# Patient Record
Sex: Female | Born: 1990 | Race: Black or African American | Hispanic: No | Smoking: Former smoker
Health system: Southern US, Community
[De-identification: ages and names within clinical notes are randomized; demographics above are authoritative.]

## PROBLEM LIST (undated history)

## (undated) ENCOUNTER — Inpatient Hospital Stay (HOSPITAL_COMMUNITY): Payer: Self-pay

## (undated) DIAGNOSIS — L910 Hypertrophic scar: Secondary | ICD-10-CM

## (undated) DIAGNOSIS — O24419 Gestational diabetes mellitus in pregnancy, unspecified control: Secondary | ICD-10-CM

## (undated) DIAGNOSIS — M1990 Unspecified osteoarthritis, unspecified site: Secondary | ICD-10-CM

## (undated) DIAGNOSIS — Z3490 Encounter for supervision of normal pregnancy, unspecified, unspecified trimester: Secondary | ICD-10-CM

## (undated) DIAGNOSIS — Z8619 Personal history of other infectious and parasitic diseases: Secondary | ICD-10-CM

## (undated) DIAGNOSIS — T883XXA Malignant hyperthermia due to anesthesia, initial encounter: Secondary | ICD-10-CM

## (undated) DIAGNOSIS — Z202 Contact with and (suspected) exposure to infections with a predominantly sexual mode of transmission: Secondary | ICD-10-CM

## (undated) HISTORY — DX: Contact with and (suspected) exposure to infections with a predominantly sexual mode of transmission: Z20.2

## (undated) HISTORY — DX: Personal history of other infectious and parasitic diseases: Z86.19

---
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
## 2010-01-01 ENCOUNTER — Emergency Department (HOSPITAL_COMMUNITY)
Admission: EM | Admit: 2010-01-01 | Discharge: 2010-01-01 | Payer: Self-pay | Source: Home / Self Care | Admitting: Emergency Medicine

## 2010-03-01 HISTORY — PX: EXTERNAL EAR SURGERY: SHX627

## 2010-05-16 LAB — WET PREP, GENITAL: Trich, Wet Prep: NONE SEEN

## 2010-05-16 LAB — URINALYSIS, ROUTINE W REFLEX MICROSCOPIC
Bilirubin Urine: NEGATIVE
Protein, ur: NEGATIVE mg/dL
Specific Gravity, Urine: 1.025 (ref 1.005–1.030)
Urobilinogen, UA: 0.2 mg/dL (ref 0.0–1.0)

## 2010-05-16 LAB — GC/CHLAMYDIA PROBE AMP, GENITAL
Chlamydia, DNA Probe: NEGATIVE
GC Probe Amp, Genital: NEGATIVE

## 2011-03-04 ENCOUNTER — Other Ambulatory Visit (HOSPITAL_COMMUNITY)
Admission: RE | Admit: 2011-03-04 | Discharge: 2011-03-04 | Disposition: A | Discharge status: Home / Self Care | Payer: 59 | Source: Ambulatory Visit | Attending: Obstetrics and Gynecology | Admitting: Obstetrics and Gynecology

## 2011-03-04 DIAGNOSIS — Z113 Encounter for screening for infections with a predominantly sexual mode of transmission: Secondary | ICD-10-CM | POA: Insufficient documentation

## 2011-03-04 DIAGNOSIS — Z01419 Encounter for gynecological examination (general) (routine) without abnormal findings: Secondary | ICD-10-CM | POA: Insufficient documentation

## 2011-05-30 ENCOUNTER — Emergency Department (HOSPITAL_COMMUNITY)
Admission: EM | Admit: 2011-05-30 | Discharge: 2011-05-30 | Disposition: A | Discharge status: Home / Self Care | Payer: 59 | Attending: Emergency Medicine | Admitting: Emergency Medicine

## 2011-05-30 ENCOUNTER — Encounter (HOSPITAL_COMMUNITY): Payer: Self-pay | Admitting: *Deleted

## 2011-05-30 DIAGNOSIS — S61409A Unspecified open wound of unspecified hand, initial encounter: Secondary | ICD-10-CM | POA: Insufficient documentation

## 2011-05-30 DIAGNOSIS — F17200 Nicotine dependence, unspecified, uncomplicated: Secondary | ICD-10-CM | POA: Insufficient documentation

## 2011-05-30 DIAGNOSIS — S61219A Laceration without foreign body of unspecified finger without damage to nail, initial encounter: Secondary | ICD-10-CM

## 2011-05-30 MED ORDER — TETANUS-DIPHTH-ACELL PERTUSSIS 5-2.5-18.5 LF-MCG/0.5 IM SUSP
INTRAMUSCULAR | Status: AC
  Filled 2011-05-30: qty 0.5

## 2011-05-30 MED ORDER — IBUPROFEN 400 MG PO TABS: 400.0000 mg | ORAL_TABLET | Freq: Four times a day (QID) | ORAL | Status: AC | PRN

## 2011-05-30 MED ORDER — TETANUS-DIPHTH-ACELL PERTUSSIS 5-2.5-18.5 LF-MCG/0.5 IM SUSP
0.5000 mL | Freq: Once | INTRAMUSCULAR | Status: AC
  Administered 2011-05-30: 0.5 mL via INTRAMUSCULAR
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
## 2011-05-30 NOTE — ED Provider Notes (Signed)
Medical screening examination/treatment/procedure(s) were performed by non-physician practitioner and as supervising physician I was immediately available for consultation/collaboration.  Cheri Guppy, MD 05/30/11 [phone]

## 2011-05-30 NOTE — Discharge Instructions (Signed)
Stitches, Staples, or Skin Adhesive Strips   Stitches (sutures), staples, and skin adhesive strips hold the skin together as it heals. They will usually be in place for 7 days or less.  HOME CARE   Wash your hands with soap and water before and after you touch your wound.   Only take medicine as told by your doctor.   Cover your wound only if your doctor told you to. Otherwise, leave it open to air.   Do not get your stitches wet or dirty. If they get dirty, dab them gently with a clean washcloth. Wet the washcloth with soapy water. Do not rub. Pat them dry gently.   Do not put medicine or medicated cream on your stitches unless your doctor told you to.   Do not take out your own stitches or staples. Skin adhesive strips will fall off by themselves.   Do not pick at the wound. Picking can cause an infection.   Do not miss your follow-up appointment.   If you have problems or questions, call your doctor.  GET HELP RIGHT AWAY IF:    You have a temperature by mouth above 102 F (38.9 C), not controlled by medicine.   You have chills.   You have redness or pain around your stitches.   There is puffiness (swelling) around your stitches.   You notice fluid (drainage) from your stitches.   There is a bad smell coming from your wound.  MAKE SURE YOU:   Understand these instructions.   Will watch your condition.   Will get help if you are not doing well or get worse.  Document Released: 12/13/2008 Document Revised: 02/04/2011 Document Reviewed: 12/13/2008  [employer] Patient Information 2012 [employer].
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
## 2011-07-04 ENCOUNTER — Emergency Department (HOSPITAL_COMMUNITY)
Admission: EM | Admit: 2011-07-04 | Discharge: 2011-07-04 | Disposition: A | Discharge status: Home / Self Care | Payer: 59 | Attending: Emergency Medicine | Admitting: Emergency Medicine

## 2011-07-04 ENCOUNTER — Encounter (HOSPITAL_COMMUNITY): Payer: Self-pay | Admitting: Emergency Medicine

## 2011-07-04 DIAGNOSIS — Z3490 Encounter for supervision of normal pregnancy, unspecified, unspecified trimester: Secondary | ICD-10-CM

## 2011-07-04 DIAGNOSIS — O210 Mild hyperemesis gravidarum: Secondary | ICD-10-CM | POA: Insufficient documentation

## 2011-07-04 LAB — URINE MICROSCOPIC-ADD ON

## 2011-07-04 LAB — URINALYSIS, ROUTINE W REFLEX MICROSCOPIC
Bilirubin Urine: NEGATIVE
Ketones, ur: NEGATIVE mg/dL
Nitrite: NEGATIVE
Specific Gravity, Urine: 1.024 (ref 1.005–1.030)
Urobilinogen, UA: 0.2 mg/dL (ref 0.0–1.0)

## 2011-07-04 LAB — POCT PREGNANCY, URINE: Preg Test, Ur: POSITIVE — AB

## 2011-07-04 NOTE — ED Notes (Signed)
Pt sts nausea and vomiting x 3 days; pt sts LMP was 03/27 and sts took home pregnancy test was positive; pt here to be checked; pt sts white vaginal discharge

## 2011-07-04 NOTE — Discharge Instructions (Signed)
Please followup with your OB GYN in the next few weeks for a prenatal appointment.  Pregnancy If you are planning on getting pregnant, it is a good idea to make a preconception appointment with your care- giver to discuss having a healthy lifestyle before getting pregnant. Such as, diet, weight, exercise, taking prenatal vitamins especially folic acid (it helps prevent brain and spinal cord defects), avoiding alcohol, smoking and illegal drugs, medical problems (diabetes, convulsions), family history of genetic problems, working conditions and immunizations. It is better to have knowledge of these things and do something about them before getting pregnant. In your pregnancy, it is important to follow certain guidelines to have a healthy baby. It is very important to get good prenatal care and follow your caregiver's instructions. Prenatal care includes all the medical care you receive before your baby's birth. This helps to prevent problems during the pregnancy and childbirth. HOME CARE INSTRUCTIONS   Start your prenatal visits by the 12th week of pregnancy or before when possible. They are usually scheduled monthly at first. They are more often in the last 2 months before delivery. It is important that you keep your caregiver's appointments and follow your caregiver's instructions regarding medication use, exercise, and diet.   During pregnancy, you are providing food for you and your baby. Eat a regular, well-balanced diet. Choose foods such as meat, fish, milk and other dairy products, vegetables, fruits, whole-grain breads and cereals. Your caregiver will inform you of the ideal weight gain depending on your current height and weight. Drink lots of liquids. Try to drink 8 glasses of water a day.   Alcohol is associated with a number of birth defects including fetal alcohol syndrome. It is best to avoid alcohol completely. Smoking will cause low birth rate and prematurity. Use of alcohol and nicotine  during your pregnancy also increases the chances that your child will be chemically dependent later in their life and may contribute to SIDS (Sudden Infant Death Syndrome).   Do not use illegal drugs.   Only take prescription or over-the-counter medications that are recommended by your caregiver. Other medications can cause genetic and physical problems in the baby.   Morning sickness can often be helped by keeping soda crackers at the bedside. Eat a couple before arising in the morning.   A sexual relationship may be continued until near the end of pregnancy if there are no other problems such as early (premature) leaking of amniotic fluid from the membranes, vaginal bleeding, painful intercourse or belly (abdominal) pain.   Exercise regularly. Check with your caregiver if you are unsure of the safety of some of your exercises.   Do not use hot tubs, steam rooms or saunas. These increase the risk of fainting or passing out and hurting yourself and the baby. Swimming is OK for exercise. Get plenty of rest, including afternoon naps when possible especially in the third trimester.   Avoid toxic odors and chemicals.   Do not wear high heels. They may cause you to lose your balance and fall.   Do not lift over 5 pounds. If you do lift anything, lift with your legs and thighs, not your back.   Avoid long trips, especially in the third trimester.   If you have to travel out of the city or state, take a copy of your medical records with you.  SEEK IMMEDIATE MEDICAL CARE IF:   You develop an unexplained oral temperature above 102 F (38.9 C), or as your caregiver suggests.  You have leaking of fluid from the vagina. If leaking membranes are suspected, take your temperature and inform your caregiver of this when you call.   There is vaginal spotting or bleeding. Notify your caregiver of the amount and how many pads are used.   You continue to feel sick to your stomach (nauseous) and have no  relief from remedies suggested, or you throw up (vomit) blood or coffee ground like materials.   You develop upper abdominal pain.   You have round ligament discomfort in the lower abdominal area. This still must be evaluated by your caregiver.   You feel contractions of the uterus.   You do not feel the baby move, or there is less movement than before.   You have painful urination.   You have abnormal vaginal discharge.   You have persistent diarrhea.   You get a severe headache.   You have problems with your vision.   You develop muscle weakness.   You feel dizzy and faint.   You develop shortness of breath.   You develop chest pain.   You have back pain that travels down to your leg and feet.   You feel irregular or a very fast heartbeat.   You develop excessive weight gain in a short period of time (5 pounds in 3 to 5 days).   You are involved with a domestic violence situation.  Document Released: 02/15/2005 Document Revised: 02/04/2011 Document Reviewed: 08/09/2008 [facility] Patient Information 2012 [place], [place].

## 2011-07-04 NOTE — ED Provider Notes (Signed)
History  Scribed for Karen Christen, MD, the patient was seen in room STRE1/STRE1. This chart was scribed by Candelaria Stagers. The patient's care started at 12:35 PM    CSN: [id number]  Arrival date & time 07/04/11  1219   None     Chief Complaint  Patient presents with  . Emesis     HPI Karen Mcmahon is a 21 y.o. female who presents to the Emergency Department complaining of emesis for the last several days.  Nothing seems to make the vomiting better or worse.  She denies vaginal bleeding or pain.  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  She denies any other medical problems and denies drinking or smoking.  She did take a home pregnancy test which is positive.  Last menstrual period was March 27.  No vaginal bleeding or abdominal pain.   History reviewed. No pertinent past medical history.  History reviewed. No pertinent past surgical history.  History reviewed. No pertinent family history.  History  Substance Use Topics  . Smoking status: Current Everyday Smoker  . Smokeless tobacco: Not on file  . Alcohol Use: No    OB History    Grav Para Term Preterm Abortions TAB SAB Ect Mult Living                  Review of Systems  Gastrointestinal: Positive for nausea and vomiting. Negative for abdominal pain.  Genitourinary: Negative for vaginal bleeding.  All other systems reviewed and are negative.    Allergies  Review of patient's allergies indicates no known allergies.  Home Medications  No current outpatient prescriptions on file.  BP 116/79  Pulse 93  Temp(Src) 98.3 F (36.8 C) (Oral)  Resp 17  SpO2 100%  Physical Exam  Nursing note and vitals reviewed. Constitutional: She is oriented to person, place, and time. She appears well-developed and well-nourished. No distress.  HENT:  Head: Normocephalic and atraumatic.  Eyes: Conjunctivae and EOM are normal. Pupils are equal, round, and reactive to light. Right eye exhibits no discharge. Left eye exhibits no  discharge.  Neck: Normal range of motion. Neck supple.  Cardiovascular: Normal rate.   Pulmonary/Chest: Effort normal.  Abdominal: Soft. She exhibits no distension. There is no tenderness. There is no rebound and no guarding.  Musculoskeletal: Normal range of motion. She exhibits no edema and no tenderness.  Neurological: She is alert and oriented to person, place, and time.  Skin: Skin is warm and dry. She is not diaphoretic.  Psychiatric: She has a normal mood and affect. Her behavior is normal. Judgment and thought content normal.    ED Course  Procedures  DIAGNOSTIC STUDIES: Oxygen Saturation is 100% on room air, normal by my interpretation.    COORDINATION OF CARE:  12:38 PM Discussed course of care including starting prenatal vitamins and need to f/u with OBGYN.    Labs Reviewed  POCT PREGNANCY, URINE - Abnormal; Notable for the following:    Preg Test, Ur POSITIVE (*)    All other components within normal limits  URINALYSIS, ROUTINE W REFLEX MICROSCOPIC   No results found.   No diagnosis found.    MDM  Patient with normal pregnancy at this point in time.  She has no vaginal bleeding or abdominal pain.  She has nausea and vomiting which is likely related to the first trimester of pregnancy.  Patient states that she does have an OB/GYN and I've advised her to make an appointment in the next few  weeks to establish prenatal care.  I've also recommended she start on prenatal vitamins.  She is not currently drinking any alcohol or smoking and I've recommended that she continue with that.   I personally performed the services described in this documentation, which was scribed in my presence. The recorded information has been reviewed and considered.   Karen Christen, MD 07/04/11 1240

## 2011-07-30 LAB — OB RESULTS CONSOLE ABO/RH: RH Type: POSITIVE

## 2011-07-30 LAB — OB RESULTS CONSOLE RUBELLA ANTIBODY, IGM: Rubella: IMMUNE

## 2011-07-30 LAB — OB RESULTS CONSOLE HIV ANTIBODY (ROUTINE TESTING): HIV: NONREACTIVE

## 2011-07-30 LAB — OB RESULTS CONSOLE RPR: RPR: NONREACTIVE

## 2011-08-20 ENCOUNTER — Emergency Department (HOSPITAL_COMMUNITY)
Admission: EM | Admit: 2011-08-20 | Discharge: 2011-08-20 | Discharge status: Against Medical Advice | Payer: 59 | Attending: Emergency Medicine | Admitting: Emergency Medicine

## 2011-08-20 ENCOUNTER — Encounter (HOSPITAL_COMMUNITY): Payer: Self-pay | Admitting: *Deleted

## 2011-08-20 DIAGNOSIS — Z0389 Encounter for observation for other suspected diseases and conditions ruled out: Secondary | ICD-10-CM | POA: Insufficient documentation

## 2011-08-20 HISTORY — DX: Encounter for supervision of normal pregnancy, unspecified, unspecified trimester: Z34.90

## 2011-08-20 LAB — OB RESULTS CONSOLE GC/CHLAMYDIA: Chlamydia: NEGATIVE
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
## 2011-08-20 NOTE — ED Notes (Signed)
Pt to peds conference room to speak with [employer], mother present.

## 2011-08-20 NOTE — ED Notes (Signed)
Pt no longer in w/r, no answer, unable to find pt, not in w/r, peds w/r, b/r h/w triage or entry way.

## 2011-09-20 LAB — OB RESULTS CONSOLE GBS: GBS: POSITIVE

## 2011-12-18 ENCOUNTER — Encounter (HOSPITAL_COMMUNITY): Payer: Self-pay

## 2011-12-18 ENCOUNTER — Inpatient Hospital Stay (HOSPITAL_COMMUNITY)
Admission: AD | Admit: 2011-12-18 | Discharge: 2011-12-18 | Disposition: A | Discharge status: Home / Self Care | Payer: 59 | Source: Ambulatory Visit | Attending: Obstetrics and Gynecology | Admitting: Obstetrics and Gynecology

## 2011-12-18 DIAGNOSIS — O9989 Other specified diseases and conditions complicating pregnancy, childbirth and the puerperium: Secondary | ICD-10-CM

## 2011-12-18 DIAGNOSIS — O99891 Other specified diseases and conditions complicating pregnancy: Secondary | ICD-10-CM | POA: Insufficient documentation

## 2011-12-18 DIAGNOSIS — M940 Chondrocostal junction syndrome [Tietze]: Secondary | ICD-10-CM | POA: Insufficient documentation

## 2011-12-18 DIAGNOSIS — R079 Chest pain, unspecified: Secondary | ICD-10-CM | POA: Insufficient documentation

## 2011-12-18 MED ORDER — ACETAMINOPHEN-CODEINE 300-30 MG PO TABS: 1.0000 | ORAL_TABLET | ORAL | Status: DC | PRN

## 2011-12-18 MED ORDER — CYCLOBENZAPRINE HCL 10 MG PO TABS
10.0000 mg | ORAL_TABLET | Freq: Once | ORAL | Status: AC
  Administered 2011-12-18: 10 mg via ORAL
  Filled 2011-12-18: qty 1

## 2011-12-18 NOTE — MAU Note (Signed)
Pt states left rib pain when she coughs or sneezes x1 week. States doesn't have a cough. Denies any accidents falls or trauma to the area. States had 1 contraction today. Positive fetal movement. Denies complications with pregnancy, vaginal bleeding or vaginal discharge.

## 2011-12-18 NOTE — MAU Provider Note (Signed)
  History     CSN: [id number]  Arrival date and time: 12/18/11 2029   First Provider Initiated Contact with Patient 12/18/11 2135      Chief Complaint  Patient presents with  . Chest Pain    rib pain when coughs or sneezes   HPI  Pt is a G3P0020 at 29 wks IUP here with report of pain in ribs with coughing x past week.  Pt states rib pain started a week ago.  No injury to area.  Coughing started 3-4 days later.  No fever, body aches or chills.  No longer has the coughing.  Reports no other problems or concerns.    Past Medical History  Diagnosis Date  . Pregnant   . No pertinent past medical history     Past Surgical History  Procedure Date  . External ear surgery     Family History  Problem Relation Age of Onset  . Other Neg Hx     History  Substance Use Topics  . Smoking status: Former Smoker    Quit date: 05/20/2011  . Smokeless tobacco: Not on file  . Alcohol Use: No    Allergies: No Known Allergies  Prescriptions prior to admission  Medication Sig Dispense Refill  . Prenatal Vit-Fe Fumarate-FA (PRENATAL MULTIVITAMIN) TABS Take 1 tablet by mouth daily.      Marland Kitchen pyridOXINE (VITAMIN B-6) 100 MG tablet Take 100 mg by mouth 2 (two) times daily.        Review of Systems  Musculoskeletal:       Rib pain  All other systems reviewed and are negative.   Physical Exam   Blood pressure 120/68, pulse 93, temperature 97.6 F (36.4 C), temperature source Oral, resp. rate 18, height 5\' 7"  (1.702 m), weight 97.886 kg (215 lb 12.8 oz), last menstrual period 05/29/2011. O2 sat 100%%  Physical Exam  Constitutional: She is oriented to person, place, and time. She appears well-developed and well-nourished. No distress.  HENT:  Head: Normocephalic.  Neck: Normal range of motion. Neck supple.  Cardiovascular: Normal rate, regular rhythm and normal heart sounds.   Respiratory: Effort normal and breath sounds normal. No respiratory distress. She exhibits tenderness  (intercostal area).  Genitourinary: No bleeding around the vagina.  Neurological: She is alert and oriented to person, place, and time.  Skin: Skin is warm and dry.   FHR 130's, +accels, reactive Toco - none  MAU Course  Procedures  No results found for this or any previous visit (from the past 24 hour(s)).   Assessment and Plan  Costochondritis  Plan: DC to home RX Tylenol#3 Call office if pain worsens or doesn't improve  [facility] 12/18/2011, 9:35 PM

## 2012-02-24 ENCOUNTER — Telehealth (HOSPITAL_COMMUNITY): Payer: Self-pay | Admitting: *Deleted

## 2012-02-24 ENCOUNTER — Encounter (HOSPITAL_COMMUNITY): Payer: Self-pay | Admitting: *Deleted

## 2012-02-24 NOTE — Telephone Encounter (Signed)
Preadmission screen  

## 2012-02-29 ENCOUNTER — Encounter (HOSPITAL_COMMUNITY): Payer: Self-pay | Admitting: *Deleted

## 2012-02-29 ENCOUNTER — Inpatient Hospital Stay (HOSPITAL_COMMUNITY)
Admission: AD | Admit: 2012-02-29 | Discharge: 2012-02-29 | Disposition: A | Discharge status: Home / Self Care | Payer: 59 | Source: Ambulatory Visit | Attending: Obstetrics and Gynecology | Admitting: Obstetrics and Gynecology

## 2012-02-29 DIAGNOSIS — O479 False labor, unspecified: Secondary | ICD-10-CM | POA: Insufficient documentation

## 2012-02-29 MED ORDER — OXYCODONE-ACETAMINOPHEN 5-325 MG PO TABS
2.0000 | ORAL_TABLET | Freq: Once | ORAL | Status: AC
  Administered 2012-02-29: 2 via ORAL
  Filled 2012-02-29: qty 2

## 2012-02-29 NOTE — MAU Note (Signed)
Dr. Richardson Dopp notified of pt, orders rec'd.

## 2012-02-29 NOTE — MAU Note (Signed)
Pt G3 P0 at 40wks having contractions every all night.

## 2012-03-01 ENCOUNTER — Inpatient Hospital Stay (HOSPITAL_COMMUNITY)
Admission: AD | Admit: 2012-03-01 | Discharge: 2012-03-04 | DRG: 774 | Disposition: A | Discharge status: Home / Self Care | Payer: 59 | Source: Ambulatory Visit | Attending: Obstetrics and Gynecology | Admitting: Obstetrics and Gynecology

## 2012-03-01 ENCOUNTER — Encounter (HOSPITAL_COMMUNITY): Payer: Self-pay | Admitting: Anesthesiology

## 2012-03-01 ENCOUNTER — Encounter (HOSPITAL_COMMUNITY): Payer: Self-pay

## 2012-03-01 ENCOUNTER — Inpatient Hospital Stay (HOSPITAL_COMMUNITY): Payer: 59 | Admitting: Anesthesiology

## 2012-03-01 DIAGNOSIS — IMO0001 Reserved for inherently not codable concepts without codable children: Secondary | ICD-10-CM

## 2012-03-01 DIAGNOSIS — O99892 Other specified diseases and conditions complicating childbirth: Secondary | ICD-10-CM | POA: Diagnosis present

## 2012-03-01 DIAGNOSIS — O3660X0 Maternal care for excessive fetal growth, unspecified trimester, not applicable or unspecified: Secondary | ICD-10-CM | POA: Diagnosis present

## 2012-03-01 DIAGNOSIS — Z22330 Carrier of Group B streptococcus: Secondary | ICD-10-CM

## 2012-03-01 LAB — RPR: RPR Ser Ql: NONREACTIVE

## 2012-03-01 LAB — CBC
HCT: 40.3 % (ref 36.0–46.0)
Hemoglobin: 13.9 g/dL (ref 12.0–15.0)
RBC: 4.7 MIL/uL (ref 3.87–5.11)
WBC: 8.6 10*3/uL (ref 4.0–10.5)

## 2012-03-01 MED ORDER — PENICILLIN G POTASSIUM 5000000 UNITS IJ SOLR
5.0000 10*6.[IU] | Freq: Once | INTRAVENOUS | Status: AC
  Administered 2012-03-01: 5 10*6.[IU] via INTRAVENOUS
  Filled 2012-03-01: qty 5

## 2012-03-01 MED ORDER — PENICILLIN G POTASSIUM 5000000 UNITS IJ SOLR
2.5000 10*6.[IU] | INTRAMUSCULAR | Status: DC
  Administered 2012-03-01 – 2012-03-02 (×5): 2.5 10*6.[IU] via INTRAVENOUS
  Filled 2012-03-01 (×8): qty 2.5

## 2012-03-01 MED ORDER — PHENYLEPHRINE 40 MCG/ML (10ML) SYRINGE FOR IV PUSH (FOR BLOOD PRESSURE SUPPORT)
80.0000 ug | PREFILLED_SYRINGE | INTRAVENOUS | Status: DC | PRN
  Filled 2012-03-01: qty 5

## 2012-03-01 MED ORDER — PHENYLEPHRINE 40 MCG/ML (10ML) SYRINGE FOR IV PUSH (FOR BLOOD PRESSURE SUPPORT): 80.0000 ug | PREFILLED_SYRINGE | INTRAVENOUS | Status: DC | PRN

## 2012-03-01 MED ORDER — LACTATED RINGERS IV SOLN
500.0000 mL | INTRAVENOUS | Status: DC | PRN
  Administered 2012-03-01: 500 mL via INTRAVENOUS

## 2012-03-01 MED ORDER — OXYTOCIN 40 UNITS IN LACTATED RINGERS INFUSION - SIMPLE MED
1.0000 m[IU]/min | INTRAVENOUS | Status: DC
  Administered 2012-03-01: 2 m[IU]/min via INTRAVENOUS
  Administered 2012-03-01: 4 m[IU]/min via INTRAVENOUS

## 2012-03-01 MED ORDER — TERBUTALINE SULFATE 1 MG/ML IJ SOLN: 0.2500 mg | Freq: Once | INTRAMUSCULAR | Status: AC | PRN

## 2012-03-01 MED ORDER — OXYCODONE-ACETAMINOPHEN 5-325 MG PO TABS: 1.0000 | ORAL_TABLET | ORAL | Status: DC | PRN

## 2012-03-01 MED ORDER — OXYTOCIN BOLUS FROM INFUSION: 500.0000 mL | INTRAVENOUS | Status: DC

## 2012-03-01 MED ORDER — EPHEDRINE 5 MG/ML INJ
10.0000 mg | INTRAVENOUS | Status: DC | PRN
  Filled 2012-03-01: qty 4

## 2012-03-01 MED ORDER — DIPHENHYDRAMINE HCL 50 MG/ML IJ SOLN
12.5000 mg | INTRAMUSCULAR | Status: DC | PRN
  Administered 2012-03-02: 12.5 mg via INTRAVENOUS
  Filled 2012-03-01: qty 1

## 2012-03-01 MED ORDER — OXYTOCIN 40 UNITS IN LACTATED RINGERS INFUSION - SIMPLE MED
62.5000 mL/h | INTRAVENOUS | Status: DC
  Administered 2012-03-02: 62.5 mL/h via INTRAVENOUS
  Filled 2012-03-01 (×2): qty 1000

## 2012-03-01 MED ORDER — CITRIC ACID-SODIUM CITRATE 334-500 MG/5ML PO SOLN: 30.0000 mL | ORAL | Status: DC | PRN

## 2012-03-01 MED ORDER — BUTORPHANOL TARTRATE 2 MG/ML IJ SOLN
2.0000 mg | INTRAMUSCULAR | Status: DC | PRN
  Administered 2012-03-01: 2 mg via INTRAVENOUS
  Filled 2012-03-01: qty 2

## 2012-03-01 MED ORDER — FENTANYL 2.5 MCG/ML BUPIVACAINE 1/10 % EPIDURAL INFUSION (WH - ANES)
14.0000 mL/h | INTRAMUSCULAR | Status: DC
  Administered 2012-03-01 – 2012-03-02 (×3): 14 mL/h via EPIDURAL
  Filled 2012-03-01 (×3): qty 125

## 2012-03-01 MED ORDER — ONDANSETRON HCL 4 MG/2ML IJ SOLN
4.0000 mg | Freq: Four times a day (QID) | INTRAMUSCULAR | Status: DC | PRN
  Administered 2012-03-01: 4 mg via INTRAVENOUS
  Filled 2012-03-01: qty 2

## 2012-03-01 MED ORDER — LACTATED RINGERS IV SOLN: 500.0000 mL | Freq: Once | INTRAVENOUS | Status: DC

## 2012-03-01 MED ORDER — LIDOCAINE HCL (PF) 1 % IJ SOLN
INTRAMUSCULAR | Status: DC | PRN
  Administered 2012-03-01 (×2): 5 mL

## 2012-03-01 MED ORDER — LIDOCAINE HCL (PF) 1 % IJ SOLN
30.0000 mL | INTRAMUSCULAR | Status: DC | PRN
  Filled 2012-03-01: qty 30

## 2012-03-01 MED ORDER — EPHEDRINE 5 MG/ML INJ
10.0000 mg | INTRAVENOUS | Status: DC | PRN
  Administered 2012-03-01: 20:00:00 via INTRAVENOUS

## 2012-03-01 MED ORDER — ACETAMINOPHEN 325 MG PO TABS: 650.0000 mg | ORAL_TABLET | ORAL | Status: DC | PRN

## 2012-03-01 MED ORDER — IBUPROFEN 600 MG PO TABS
600.0000 mg | ORAL_TABLET | Freq: Four times a day (QID) | ORAL | Status: DC | PRN
  Administered 2012-03-02: 600 mg via ORAL
  Filled 2012-03-01: qty 1

## 2012-03-01 MED ORDER — LACTATED RINGERS IV SOLN
INTRAVENOUS | Status: DC
  Administered 2012-03-01 (×3): via INTRAVENOUS

## 2012-03-01 NOTE — Anesthesia Procedure Notes (Signed)
Epidural Patient location during procedure: OB Start time: 03/01/2012 7:14 PM  Staffing Anesthesiologist: Angus Seller., Harrell Gave. Performed by: anesthesiologist   Preanesthetic Checklist Completed: patient identified, site marked, surgical consent, pre-op evaluation, timeout performed, IV checked, risks and benefits discussed and monitors and equipment checked  Epidural Patient position: sitting Prep: site prepped and draped and DuraPrep Patient monitoring: continuous pulse ox and blood pressure Approach: midline Injection technique: LOR air and LOR saline  Needle:  Needle type: Tuohy  Needle gauge: 17 G Needle length: 9 cm and 9 Needle insertion depth: 7 cm Catheter type: closed end flexible Catheter size: 19 Gauge Catheter at skin depth: 14 cm Test dose: negative  Assessment Events: blood not aspirated, injection not painful, no injection resistance, negative IV test and no paresthesia  Additional Notes Patient identified.  Risk benefits discussed including failed block, incomplete pain control, headache, nerve damage, paralysis, blood pressure changes, nausea, vomiting, reactions to medication both toxic or allergic, and postpartum back pain.  Patient expressed understanding and wished to proceed.  All questions were answered.  Sterile technique used throughout procedure and epidural site dressed with sterile barrier dressing. No paresthesia or other complications noted.The patient did not experience any signs of intravascular injection such as tinnitus or metallic taste in mouth nor signs of intrathecal spread such as rapid motor block. Please see nursing notes for vital signs.

## 2012-03-01 NOTE — H&P (Signed)
Karen Mcmahon is a 22 y.o. G33 P0A2 female, EDC 02/29/12, presenting for labor.Contractions have been regular every 5 min or less for hours. Membranes ruptures at 6 am today. Fluid was clear.  Confirmation of ruptured membranes was done in MAU.  The patient reports uncomplicated prenatal care with Dr. Richardson Dopp at [facility].  Prenatal record was reviewed. Group B strep was positive.   OB History    Grav Para Term Preterm Abortions TAB SAB Ect Mult Living   3    2 1 1    0     Past Medical History  Diagnosis Date  . Pregnant   . Trichomonas contact, treated   . Hx of gonorrhea    Past Surgical History  Procedure Date  . External ear surgery    Family History: family history includes Diabetes in her mother and Hypertension in her mother.  There is no history of Other. Social History:  reports that she quit smoking about 9 months ago. She has never used smokeless tobacco. She reports that she uses illicit drugs (Marijuana). She reports that she does not drink alcohol.  ROS is noncontributory  Dilation: 1.5 Effacement (%): 80 Station: -2 Exam by:: S. Carrera, RNC Blood pressure 137/79, pulse 91, temperature 97 F (36.1 C), temperature source Oral, resp. rate 18, height 5\' 8"  (1.727 m), weight 99.338 kg (219 lb), last menstrual period 05/29/2011, SpO2 99.00%.   General  No acute distress HEENT nl Chest Clear S1 and S2 clear Abd BS present Fundus term Cx noted Ext nl  Prenatal labs: ABO, Rh: B/Positive/-- (05/31 0000) Antibody: Negative (05/31 0000) Rubella:   RPR: Nonreactive (05/31 0000)  HBsAg: Negative (05/31 0000)  HIV: Non-reactive (05/31 0000)  GBS: Positive (07/22 0000)   Assessment/Plan: 22 yo G3 P0 A2, 40 1/7 weeks, labor, SROM Positive GBS  Plan:  Expectant care Penicillin protocol for GBS.   Zacharias Ridling E 03/01/2012, 10:38 AM

## 2012-03-01 NOTE — Anesthesia Preprocedure Evaluation (Signed)
Anesthesia Evaluation  Patient identified by MRN, date of birth, ID band Patient awake    Reviewed: Allergy & Precautions, H&P , Patient's Chart, lab work & pertinent test results  Airway Mallampati: II TM Distance: >3 FB Neck ROM: full    Dental No notable dental hx.    Pulmonary neg pulmonary ROS,  breath sounds clear to auscultation  Pulmonary exam normal       Cardiovascular negative cardio ROS  Rhythm:regular Rate:Normal     Neuro/Psych negative neurological ROS  negative psych ROS   GI/Hepatic negative GI ROS, Neg liver ROS,   Endo/Other  negative endocrine ROS  Renal/GU negative Renal ROS     Musculoskeletal   Abdominal   Peds  Hematology negative hematology ROS (+)   Anesthesia Other Findings Pregnant     Trichomonas contact, treated        Hx of gonorrhea                 Reproductive/Obstetrics (+) Pregnancy                           Anesthesia Physical Anesthesia Plan  ASA: II  Anesthesia Plan: Epidural   Post-op Pain Management:    Induction:   Airway Management Planned:   Additional Equipment:   Intra-op Plan:   Post-operative Plan:   Informed Consent: I have reviewed the patients History and Physical, chart, labs and discussed the procedure including the risks, benefits and alternatives for the proposed anesthesia with the patient or authorized representative who has indicated his/her understanding and acceptance.     Plan Discussed with:   Anesthesia Plan Comments:         Anesthesia Quick Evaluation

## 2012-03-01 NOTE — MAU Note (Signed)
Pt states contractions started yesterday morning and pt came to MAU. She was sent home and states that her contraqctions have now gotten progressively stronger and closer together.

## 2012-03-01 NOTE — L&D Delivery Note (Signed)
Delivery Note At 2:11 PM a viable female was delivered via Vaginal, Spontaneous Delivery (Presentation: Left Occiput Anterior).  APGAR: 8, 9; weight 8 lb 13.6 oz (4015 g).   Placenta status: Intact, Spontaneous.  Cord: 3 vessels with the following complications: None.  Cord pH: n/a Uterine atony.  Methergine IM x 1 and Cytotec 800 mcg PR given.  Small to moderate clots removed from cervical os.  Placenta intact.    Anesthesia: Epidural  Episiotomy: None Lacerations: Vaginal;Labial Suture Repair: 2.0 chromic, repaired only vaginal laceration  Est. Blood Loss (mL): 500  Mom to postpartum.  Baby to skin to skin.  Kiyah Demartini 03/03/2012, 1:18 AM

## 2012-03-02 ENCOUNTER — Inpatient Hospital Stay (HOSPITAL_COMMUNITY): Admission: RE | Admit: 2012-03-02 | Payer: 59 | Source: Ambulatory Visit

## 2012-03-02 ENCOUNTER — Encounter (HOSPITAL_COMMUNITY): Payer: Self-pay | Admitting: *Deleted

## 2012-03-02 MED ORDER — OXYTOCIN 40 UNITS IN LACTATED RINGERS INFUSION - SIMPLE MED: 62.5000 mL/h | INTRAVENOUS | Status: DC | PRN

## 2012-03-02 MED ORDER — SENNOSIDES-DOCUSATE SODIUM 8.6-50 MG PO TABS
2.0000 | ORAL_TABLET | Freq: Every day | ORAL | Status: DC
  Administered 2012-03-02: 2 via ORAL

## 2012-03-02 MED ORDER — ZOLPIDEM TARTRATE 5 MG PO TABS: 5.0000 mg | ORAL_TABLET | Freq: Every evening | ORAL | Status: DC | PRN

## 2012-03-02 MED ORDER — WITCH HAZEL-GLYCERIN EX PADS: 1.0000 "application " | MEDICATED_PAD | CUTANEOUS | Status: DC | PRN

## 2012-03-02 MED ORDER — PRENATAL MULTIVITAMIN CH
1.0000 | ORAL_TABLET | Freq: Every day | ORAL | Status: DC
  Administered 2012-03-03 – 2012-03-04 (×2): 1 via ORAL
  Filled 2012-03-02 (×3): qty 1

## 2012-03-02 MED ORDER — METHYLERGONOVINE MALEATE 0.2 MG/ML IJ SOLN
INTRAMUSCULAR | Status: AC
  Administered 2012-03-02: 0.2 mg via INTRAMUSCULAR
  Filled 2012-03-02: qty 1

## 2012-03-02 MED ORDER — LANOLIN HYDROUS EX OINT: TOPICAL_OINTMENT | CUTANEOUS | Status: DC | PRN

## 2012-03-02 MED ORDER — SODIUM CHLORIDE 0.9 % IV SOLN
2.0000 g | Freq: Four times a day (QID) | INTRAVENOUS | Status: DC
  Administered 2012-03-02: 2 g via INTRAVENOUS
  Filled 2012-03-02 (×4): qty 2000

## 2012-03-02 MED ORDER — SIMETHICONE 80 MG PO CHEW: 80.0000 mg | CHEWABLE_TABLET | ORAL | Status: DC | PRN

## 2012-03-02 MED ORDER — DIPHENHYDRAMINE HCL 25 MG PO CAPS: 25.0000 mg | ORAL_CAPSULE | Freq: Four times a day (QID) | ORAL | Status: DC | PRN

## 2012-03-02 MED ORDER — BENZOCAINE-MENTHOL 20-0.5 % EX AERO: 1.0000 "application " | INHALATION_SPRAY | CUTANEOUS | Status: DC | PRN

## 2012-03-02 MED ORDER — ONDANSETRON HCL 4 MG PO TABS: 4.0000 mg | ORAL_TABLET | ORAL | Status: DC | PRN

## 2012-03-02 MED ORDER — METHYLERGONOVINE MALEATE 0.2 MG PO TABS: 0.2000 mg | ORAL_TABLET | ORAL | Status: DC | PRN

## 2012-03-02 MED ORDER — ONDANSETRON HCL 4 MG/2ML IJ SOLN: 4.0000 mg | INTRAMUSCULAR | Status: DC | PRN

## 2012-03-02 MED ORDER — TETANUS-DIPHTH-ACELL PERTUSSIS 5-2.5-18.5 LF-MCG/0.5 IM SUSP
0.5000 mL | Freq: Once | INTRAMUSCULAR | Status: DC
  Filled 2012-03-02: qty 0.5

## 2012-03-02 MED ORDER — OXYCODONE-ACETAMINOPHEN 5-325 MG PO TABS
1.0000 | ORAL_TABLET | ORAL | Status: DC | PRN
  Administered 2012-03-02 – 2012-03-03 (×2): 1 via ORAL
  Filled 2012-03-02 (×3): qty 1

## 2012-03-02 MED ORDER — IBUPROFEN 600 MG PO TABS
600.0000 mg | ORAL_TABLET | Freq: Four times a day (QID) | ORAL | Status: DC
  Administered 2012-03-03 – 2012-03-04 (×6): 600 mg via ORAL
  Filled 2012-03-02 (×6): qty 1

## 2012-03-02 MED ORDER — METHYLERGONOVINE MALEATE 0.2 MG/ML IJ SOLN: 0.2000 mg | INTRAMUSCULAR | Status: DC | PRN

## 2012-03-02 MED ORDER — DIBUCAINE 1 % RE OINT: 1.0000 "application " | TOPICAL_OINTMENT | RECTAL | Status: DC | PRN

## 2012-03-02 MED ORDER — MAGNESIUM HYDROXIDE 400 MG/5ML PO SUSP: 30.0000 mL | ORAL | Status: DC | PRN

## 2012-03-02 MED ORDER — MISOPROSTOL 200 MCG PO TABS
ORAL_TABLET | ORAL | Status: AC
  Administered 2012-03-02: 800 ug via RECTAL
  Filled 2012-03-02: qty 4

## 2012-03-02 NOTE — Progress Notes (Signed)
Patient ID: Karen Mcmahon, female   DOB: 1990-05-31, 22 y.o.   MRN: [id number] Assuming care.  Pt of Dr. Dawayne Patricia.  Suspects macrosomia.  Ultrasound ~2 weeks ago was 8.5 lbs. Pt comfortable with epidural.  Recent report of pelvic pressure with urge to push.  Still 7 cm per RN.  Forebag ruptured early this am with mod meconium but was previously clear. AFVSS Gen:NAD EM:  Reactive, good LTV.  Contractions q 2 min. Cervix 7 cm/100/+1, thicker on right but not edematous Adequate pelvis. EFW by [place] 8.5 lbs. A/p:  IUP at 40 2/7 weeks,  SROM now prolonged ROM.  Meconium.  Suspected Macrosomia. IUPC placed to confirm adequate contractions, keep pt on normal labor curve. D/c PCN and start Ampicillin at 9 am. Anticipate SVD.

## 2012-03-03 LAB — CBC
Platelets: 180 10*3/uL (ref 150–400)
RBC: 4.28 MIL/uL (ref 3.87–5.11)
WBC: 14.7 10*3/uL — ABNORMAL HIGH (ref 4.0–10.5)

## 2012-03-03 NOTE — Clinical Social Work Maternal (Signed)
    Clinical Social Work Department PSYCHOSOCIAL ASSESSMENT - MATERNAL/CHILD 03/03/2012  Patient:  Karen Mcmahon, Karen Mcmahon  Account Number:  [id number]  Admit Date:  03/01/2012  Marjo Bicker Name:   Vida Roller III    Clinical Social Worker:  Lulu Riding, [place]   Date/Time:  03/03/2012 11:00 AM  Date Referred:  03/03/2012   Referral source  CN     Referred reason  Substance Abuse   Other referral source:    I:  FAMILY / HOME ENVIRONMENT Child's legal guardian:  PARENT  Guardian - Name Guardian - Age Guardian - Address  Dru Laurel [address], [address], [place], [place]  [employer].  does not live with MOB   Other household support members/support persons Other support:   Parents report having a good support system    II  PSYCHOSOCIAL DATA Information Source:  Family Interview  Event organiser Employment:   Surveyor, quantity resources:  Media planner If [employer]:  [place] Other  [facility]   School / Grade:   Maternity Care Coordinator / Child Services Coordination / Early Interventions:  Cultural issues impacting care:   None identified    III  STRENGTHS Strengths  Adequate Resources  Compliance with medical plan  Home prepared for Child (including basic supplies)  Supportive family/friends   Strength comment:    IV  RISK FACTORS AND CURRENT PROBLEMS Current Problem:  None   Risk Factor & Current Problem Patient Issue Family Issue Risk Factor / Current Problem Comment   N N     V  SOCIAL WORK ASSESSMENT CSW met with parents in MOB's first floor room/117 to complete assessment for hx of Marijuana use.  Parents were pleasant and state they are doing well.  This is MOB's first baby and FOB's second.  He has a 81 year old daughter who lives with her mother in [place].  They report a good support system and having everything they need for baby at home.  MOB stated we could talk about anything with FOB present.  CSW inquired about MOB's history of  Marijuana use and she states no use since finding out she was pregnant and denies any other drug use.  CSW explained hospital drug screen policy and MOB was understanding.  She seemed to have a somewhat flat affect although she was appropriately attentive to the baby.  CSW asked if she had any hx of depression and discussed signs and symptoms of PPD.  She states she gets depressed sometimes, but does describes her symptoms as mild.  She states she has never taken medication for depression.  She was attentive to conversation regarding PPD and state she feels comfortable talking with her doctor if issues arise.  CSW has no further concerns at this time.      VI SOCIAL WORK PLAN Social Work Plan  No Further Intervention Required / No Barriers to Discharge   Type of pt/family education:   PPD signs and symptoms   If child protective services report - county:   If child protective services report - date:   Information/referral to community resources comment:   No needs identified at [facility] time.   Other social work plan:   CSW will monitor drug screen results.

## 2012-03-03 NOTE — Progress Notes (Signed)
Post Partum Day 1 s/p vaginal delivery  Subjective: up ad lib, voiding and tolerating PO  Objective: Blood pressure 111/64, pulse 76, temperature 98.4 F (36.9 C), temperature source Oral, resp. rate 20, height 5\' 8"  (1.727 m), weight 99.338 kg (219 lb), last menstrual period 05/29/2011, SpO2 98.00%, unknown if currently breastfeeding.  Physical Exam:  General: alert and cooperative Lochia: appropriate Uterine Fundus: firm Incision: NA DVT Evaluation: No evidence of DVT seen on physical exam.   Basename 03/03/12 0540 03/01/12 0805  HGB 12.5 13.9  HCT 36.7 40.3    Assessment/Plan: Plan for discharge tomorrow  routine postpartum care    LOS: 2 days   Karen Mcmahon J. 03/03/2012, 1:40 PM

## 2012-03-03 NOTE — Anesthesia Postprocedure Evaluation (Signed)
Anesthesia Post Note  Patient: Karen Mcmahon  Procedure(s) Performed: * No procedures listed *  Anesthesia type: Epidural  Patient location: Mother/Baby  Post pain: Pain level controlled  Post assessment: Post-op Vital signs reviewed  Last Vitals:  Filed Vitals:   03/03/12 0512  BP: 111/64  Pulse: 76  Temp: 36.9 C  Resp: 20    Post vital signs: Reviewed  Level of consciousness:alert  Complications: No apparent anesthesia complications

## 2012-03-04 DIAGNOSIS — IMO0001 Reserved for inherently not codable concepts without codable children: Secondary | ICD-10-CM

## 2012-03-04 MED ORDER — IBUPROFEN 600 MG PO TABS: 600.0000 mg | ORAL_TABLET | Freq: Four times a day (QID) | ORAL | Status: DC | PRN

## 2012-03-04 NOTE — Discharge Summary (Signed)
Obstetric Discharge Summary Reason for Admission: rupture of membranes Prenatal Procedures: none Intrapartum Procedures: spontaneous vaginal delivery Postpartum Procedures: none Complications-Operative and Postpartum: hemorrhage Hemoglobin  Date Value Range Status  03/03/2012 12.5  12.0 - 15.0 g/dL Final     HCT  Date Value Range Status  03/03/2012 36.7  36.0 - 46.0 % Final    Physical Exam:  General: alert and cooperative Lochia: appropriate Uterine Fundus: firm Incision: NA DVT Evaluation: No evidence of DVT seen on physical exam.  Discharge Diagnoses: Term Pregnancy-delivered  Discharge Information: Date: 03/04/2012 Activity: pelvic rest Diet: routine Medications: Tylenol #3 and Ibuprofen Condition: stable Instructions: refer to practice specific booklet Discharge to: home Follow-up Information    Follow up with Jessee Avers., MD. Schedule an appointment as soon as possible for a visit in 6 weeks. (postpartum visit )    Contact information:   [address] [place] [address] [phone]          Newborn Data: Live born female  Birth Weight: 8 lb 13.6 oz (4015 g) APGAR: 8, 9  Home with mother.  Serapio Edelson J. 03/04/2012, 8:31 AM

## 2012-03-04 NOTE — Progress Notes (Signed)
Post Partum Day 2 s/p vaginal delivery  Subjective: no complaints, up ad lib, voiding and tolerating PO  Objective: Blood pressure 110/69, pulse 76, temperature 97.4 F (36.3 C), temperature source Oral, resp. rate 18, height 5\' 8"  (1.727 m), weight 99.338 kg (219 lb), last menstrual period 05/29/2011, SpO2 98.00%, unknown if currently breastfeeding.  Physical Exam:  General: alert and cooperative Lochia: appropriate Uterine Fundus: firm Incision: NA DVT Evaluation: No evidence of DVT seen on physical exam.   Basename 03/03/12 0540  HGB 12.5  HCT 36.7    Assessment/Plan: Discharge home and Contraception discussed with patient she is undecided    LOS: 3 days   Kyli Sorter J. 03/04/2012, 8:25 AM

## 2012-03-17 ENCOUNTER — Telehealth: Payer: Self-pay | Admitting: Radiation Oncology

## 2012-03-17 NOTE — Telephone Encounter (Signed)
Patient left message to contact her at [phone]. Called back this number and man that answered phone did not know who this person is. Confirmed number is Darryl Nestle who took message. Call patient's mobile number listed in demographics. No answer. Unable to leave message because mailbox is full. Home number listed in demographics has been disconnected.

## 2012-03-21 ENCOUNTER — Encounter: Payer: Self-pay | Admitting: Radiation Oncology

## 2012-03-22 ENCOUNTER — Ambulatory Visit
Admission: RE | Admit: 2012-03-22 | Discharge: 2012-03-22 | Disposition: A | Discharge status: Home / Self Care | Payer: 59 | Source: Ambulatory Visit | Attending: Radiation Oncology | Admitting: Radiation Oncology

## 2012-03-22 ENCOUNTER — Encounter: Payer: Self-pay | Admitting: Radiation Oncology

## 2012-03-22 VITALS — BP 120/75 | HR 93 | Temp 97.9°F | Resp 20 | Wt 204.2 lb

## 2012-03-22 DIAGNOSIS — L910 Hypertrophic scar: Secondary | ICD-10-CM | POA: Diagnosis not present

## 2012-03-22 DIAGNOSIS — Z87891 Personal history of nicotine dependence: Secondary | ICD-10-CM | POA: Insufficient documentation

## 2012-03-22 DIAGNOSIS — O99893 Other specified diseases and conditions complicating puerperium: Secondary | ICD-10-CM | POA: Insufficient documentation

## 2012-03-22 HISTORY — DX: Hypertrophic scar: L91.0

## 2012-03-22 NOTE — Addendum Note (Signed)
Encounter addended by: Delynn Flavin, RN on: 03/22/2012  6:07 PM<BR>     Documentation filed: Charges VN

## 2012-03-22 NOTE — Progress Notes (Signed)
Here today for assessment of Left lateral earlobe Keloid.  This Keloid has been removed twice with recurrence.  Denies pain , but has itching of the keloid.    Has a small keloid on her right inner forearm.

## 2012-03-22 NOTE — Progress Notes (Signed)
Radiation Oncology         [phone] ________________________________  Initial outpatient Consultation  Name: Karen Mcmahon MRN: [id number]  Date: 03/22/2012  DOB: June 06, 1990   REFERRING PHYSICIAN: Fran Lowes.,*  DIAGNOSIS: The encounter diagnosis was Keloid scar of skin.  HISTORY OF PRESENT ILLNESS::Karen Mcmahon is a 22 y.o. female who is almost 22 years old. She reports that in 9th grade she self pierced the helix of her left ear. She developed a keloid which was excised several years ago by Dr. Shon Hough. She underwent steroid injections postoperatively, but the keloid recurred. She underwent repeat surgery by Dr Shon Hough, but the patient cannot report exactly when the surgery occurred. It was at least several months ago. She cannot remember if any adjuvant therapy was provided. She recalls that she did use creams over her ear in the past. She reports that she is not compliant with applying the cream as frequently as instructed. She cannot remember the type of cream that was recommended.  The patient sought a second opinion after her second recurrence of the keloid. She saw Dr. Margo Aye of dermatology. Dr. Margo Aye has tentative plans to remove it again though she has not yet scheduled. Her surgery had been delayed until she gave birth to her baby boy. She is postpartum now, having given birth to her baby boy at the beginning of this month.  The patient denies any prior history of cancer. She reports that the keloid itches. She denies dry mouth. She is a somewhat limited historian.  PREVIOUS RADIATION THERAPY: No  PAST MEDICAL HISTORY:  has a past medical history of Pregnant; Trichomonas contact, treated; gonorrhea; and Keloid.    PAST SURGICAL HISTORY: Past Surgical History  Procedure Date  . External ear surgery     FAMILY HISTORY: family history includes Diabetes in her mother and Hypertension in her mother.  There is no history of Other.  SOCIAL HISTORY:  reports that  she quit smoking about 10 months ago. She has never used smokeless tobacco. She reports that she uses illicit drugs (Marijuana). She reports that she does not drink alcohol.  ALLERGIES: Review of patient's allergies indicates no known allergies.  MEDICATIONS:  No current outpatient prescriptions on file.    REVIEW OF SYSTEMS:   Pertinent items are noted in HPI.   PHYSICAL EXAM:  weight is 204 lb 3.2 oz (92.625 kg). Her oral temperature is 97.9 F (36.6 C). Her blood pressure is 120/75 and her pulse is 93. Her respiration is 20.   Sitting comfortably in a chair. No acute distress. Left ear notable for a keloid along the helix, measuring approximately 3-1/2 cm in greatest dimension. Oral cavity demonstrates no lesions. Relatively moist mucous membranes. Significant acne with scarring along left cheek.  LABORATORY DATA:  Lab Results  Component Value Date   WBC 14.7* 03/03/2012   HGB 12.5 03/03/2012   HCT 36.7 03/03/2012   MCV 85.7 03/03/2012   PLT 180 03/03/2012   CMP  No results found for this basename: na, k, cl, co2, glucose, bun, creatinine, calcium, prot, albumin, ast, alt, alkphos, bilitot, gfrnonaa, gfraa        RADIOGRAPHY: No results found.    IMPRESSION/PLAN: This is a pleasant 22 year old woman with recurrent keloid of the left ear. It has recurred at least 2 times. She was treated adjuvantly with steroid injections at least one of the times that she underwent surgery.    I spoke with Dr. Margo Aye personally about the patient.  Our tentative plan is for her to undergo surgery on February 11 the simulation the afternoon of surgery to plan her radiotherapy. She would then receive her first fraction on February 12, second fraction on February 14 and third fraction February 17. I would treat the surgical site to 12 Gray in 3 fractions. I would use lead blocking to minimize radiation to the tissues around and behind her ear.   Since the patient has failed surgery twice, in spite of adjuvant  steroid injections, Dr. Margo Aye does not offer any other adjuvant therapies that he believes would adequately minimize her risk of recurrence.   Dr. Margo Aye will offer the patient a second opinion at [facility] in case she is interested in other adjuvant therapies other than radiotherapy. If this is the case, she will not be scheduled for surgery or radiotherapy.  I've spoken with the patient and Dr. Margo Aye about the small risk of secondary malignancies from radiotherapy. Such malignancies have been anecdotally reported in the medical literature.      I also spoke with the patient about the risk of skin pigmentation, damage to the cartilage of the ear, and fatigue from radiotherapy. There is a small risk of hair loss at the posterior scalp line.  She seems to be enthusiastic about treatment, but if she ultimately decides to pursue second opinion as offered by Dr. Margo Aye, I would not discouraged that.  I also underscored to the patient that if there is any chance she is pregnant prior to radiotherapy, she will need to undergo a pregnancy test. She understands that radiotherapy is absolutely contraindicated for pregnant women. She says that there is no chance she'll be pregnant before radiotherapy, but I told her she becomes sexually active in the interim she needs use contraception.  I spent 25 minutes minutes face to face with the patient and more than 50% of that time was spent in counseling and/or coordination of care.    __________________________________________   Lonie Peak, MD

## 2012-03-22 NOTE — Progress Notes (Signed)
Please see the Nurse Progress Note in the MD Initial Consult Encounter for this patient. 

## 2012-03-24 NOTE — Addendum Note (Signed)
Encounter addended by: Velvie Thomaston Mintz Chrisanne Loose, RN on: 03/24/2012  5:04 PM<BR>     Documentation filed: Charges VN

## 2012-04-12 ENCOUNTER — Ambulatory Visit: Payer: 59

## 2012-04-14 ENCOUNTER — Ambulatory Visit: Payer: 59

## 2012-04-17 ENCOUNTER — Ambulatory Visit: Payer: 59

## 2012-09-29 ENCOUNTER — Encounter (HOSPITAL_COMMUNITY): Payer: Self-pay | Admitting: Emergency Medicine

## 2012-09-29 ENCOUNTER — Emergency Department (HOSPITAL_COMMUNITY)
Admission: EM | Admit: 2012-09-29 | Discharge: 2012-09-29 | Disposition: A | Discharge status: Home / Self Care | Payer: 59 | Attending: Emergency Medicine | Admitting: Emergency Medicine

## 2012-09-29 DIAGNOSIS — Z3202 Encounter for pregnancy test, result negative: Secondary | ICD-10-CM | POA: Insufficient documentation

## 2012-09-29 DIAGNOSIS — Z872 Personal history of diseases of the skin and subcutaneous tissue: Secondary | ICD-10-CM | POA: Insufficient documentation

## 2012-09-29 DIAGNOSIS — N730 Acute parametritis and pelvic cellulitis: Secondary | ICD-10-CM | POA: Insufficient documentation

## 2012-09-29 DIAGNOSIS — F17200 Nicotine dependence, unspecified, uncomplicated: Secondary | ICD-10-CM | POA: Insufficient documentation

## 2012-09-29 DIAGNOSIS — Z8619 Personal history of other infectious and parasitic diseases: Secondary | ICD-10-CM | POA: Insufficient documentation

## 2012-09-29 LAB — URINALYSIS, ROUTINE W REFLEX MICROSCOPIC
Bilirubin Urine: NEGATIVE
Glucose, UA: NEGATIVE mg/dL
Hgb urine dipstick: NEGATIVE
Ketones, ur: NEGATIVE mg/dL
pH: 6 (ref 5.0–8.0)

## 2012-09-29 LAB — WET PREP, GENITAL: Yeast Wet Prep HPF POC: NONE SEEN

## 2012-09-29 LAB — URINE MICROSCOPIC-ADD ON

## 2012-09-29 MED ORDER — AZITHROMYCIN 500 MG PO TABS: 1000.0000 mg | ORAL_TABLET | Freq: Once | ORAL | Status: DC

## 2012-09-29 MED ORDER — CEFTRIAXONE SODIUM 250 MG IJ SOLR
250.0000 mg | Freq: Once | INTRAMUSCULAR | Status: AC
  Administered 2012-09-29: 250 mg via INTRAMUSCULAR
  Filled 2012-09-29: qty 250

## 2012-09-29 MED ORDER — METRONIDAZOLE 500 MG PO TABS: 500.0000 mg | ORAL_TABLET | Freq: Two times a day (BID) | ORAL | Status: AC

## 2012-09-29 MED ORDER — CEFTRIAXONE SODIUM 250 MG IJ SOLR: 250.0000 mg | Freq: Once | INTRAMUSCULAR | Status: DC

## 2012-09-29 MED ORDER — LIDOCAINE HCL (PF) 1 % IJ SOLN
INTRAMUSCULAR | Status: AC
  Filled 2012-09-29: qty 5

## 2012-09-29 MED ORDER — METRONIDAZOLE 500 MG PO TABS
2000.0000 mg | ORAL_TABLET | Freq: Once | ORAL | Status: AC
  Administered 2012-09-29: 2000 mg via ORAL
  Filled 2012-09-29: qty 4

## 2012-09-29 MED ORDER — AZITHROMYCIN 250 MG PO TABS: 1000.0000 mg | ORAL_TABLET | Freq: Once | ORAL | Status: DC

## 2012-09-29 MED ORDER — METRONIDAZOLE 500 MG PO TABS: 500.0000 mg | ORAL_TABLET | Freq: Two times a day (BID) | ORAL | Status: DC

## 2012-09-29 NOTE — ED Notes (Signed)
Pt c/o foul smelling yellow vaginal discharge x 3 weeks; pt sts LMP was in July

## 2012-09-29 NOTE — ED Provider Notes (Signed)
CSN: [id number]     Arrival date & time 09/29/12  1548 History     None    Chief Complaint  Patient presents with  . Vaginal Discharge   (Consider location/radiation/quality/duration/timing/severity/associated sxs/prior Treatment) HPI 22 y o b f with complaints of vaginal discharge for 2 weeks,milky coloured, with fishy odour, but not itchy. Assoc with abd pain- for the past 2 weeks, described as crampy, relieved by taking alieve, no aggrav factor. No fever, no dysuria or frequency . Sexually active with one partner in the past year, doesn't use condoms, LMP- 5th July, regular, 28-30 day cycle, had a previous hx of chlamydia and trichomonas infection 2 years ago,reports compliance with antibiotics, has an IUD presently.    Past Medical History  Diagnosis Date  . Pregnant   . Trichomonas contact, treated   . Hx of gonorrhea   . Keloid     Left Upper Lateral Earlobe   Past Surgical History  Procedure Laterality Date  . External ear surgery     Family History  Problem Relation Age of Onset  . Other Neg Hx   . Diabetes Mother   . Hypertension Mother    History  Substance Use Topics  . Smoking status: Current Every Day Smoker    Last Attempt to Quit: 05/20/2011  . Smokeless tobacco: Never Used  . Alcohol Use: Yes     Comment: occ   OB History   Grav Para Term Preterm Abortions TAB SAB Ect Mult Living   3 1 1  2 1 1   1      Review of Systems No pertinent findings in ROS.  Allergies  Review of patient's allergies indicates no known allergies.  Home Medications   Current Outpatient Rx  Name  Route  Sig  Dispense  Refill  . azithromycin (ZITHROMAX) 500 MG tablet   Oral   Take 2 tablets (1,000 mg total) by mouth once.   2 tablet   0   . cefTRIAXone (ROCEPHIN) 250 MG injection   Intramuscular   Inject 250 mg into the muscle once. FOR IM use in LARGE MUSCLE MASS   1 each   0   . metroNIDAZOLE (FLAGYL) 500 MG tablet   Oral   Take 1 tablet (500 mg total) by mouth  every 12 (twelve) hours.   14 tablet   0    BP 119/77  Pulse 85  Temp(Src) 98 F (36.7 C) (Oral)  Resp 16  SpO2 100% Physical Exam Gen- alert, oriented, not in any distress. Cardiac- RRR, no murmurs, rubs or gallops. Resp- clear to auscultation. Abd- Soft, non tender, normal bowel sounds, no masses or organomegaly. Pelvic- sterile speculum examination- cervical os central, with moderate milky coloured discharge. Bimanual pelvic exam- no adnexa masses, no cervical motion tenderness.  ED Course   Procedures (including critical care time)   Labs Reviewed  WET PREP, GENITAL - Abnormal; Notable for the following:    Clue Cells Wet Prep HPF POC MANY (*)    WBC, Wet Prep HPF POC MANY (*)    All other components within normal limits  URINALYSIS, ROUTINE W REFLEX MICROSCOPIC - Abnormal; Notable for the following:    Leukocytes, UA TRACE (*)    All other components within normal limits  GC/CHLAMYDIA PROBE AMP  URINE MICROSCOPIC-ADD ON  RPR  POCT PREGNANCY, URINE  POCT PREGNANCY, URINE   No results found. No diagnosis found.  MDM   Pelvic Inflammatory Dx- - Vaginal discharge- PCR for Gonoccocal  and Chlamydia probe amp. - RPR - ceftriaxone  - Azithromycin -Preg test- negative.  Bacterial vaginosis and trichomonas- Wet prep was done- neg for yeast and trichomonas, but positive for many clue cells. - Metronidazole- patient had a baby in January this year. Though she said she is not breast feeding, she was advised to avoid breast feeding for at least 24 hrs after her dose of metronidazole. -Counselled patient about recurrence of infection and treatment of partner, and the risk of getting even more serious sexually transmitted infection, with unprotected intercourse.  Kennis Carina, MD 09/29/12 1610

## 2012-09-30 NOTE — ED Provider Notes (Signed)
I saw and evaluated the patient, reviewed the resident's note and I agree with the findings and plan.   .Face to face Exam:  General:  Awake HEENT:  Atraumatic Resp:  Normal effort Abd:  Nondistended Neuro:No focal weakness  Nelia Shi, MD 09/30/12 1116

## 2012-11-13 ENCOUNTER — Other Ambulatory Visit: Payer: Self-pay | Admitting: Obstetrics and Gynecology

## 2012-11-13 ENCOUNTER — Other Ambulatory Visit (HOSPITAL_COMMUNITY)
Admission: RE | Admit: 2012-11-13 | Discharge: 2012-11-13 | Disposition: A | Discharge status: Home / Self Care | Payer: 59 | Source: Ambulatory Visit | Attending: Obstetrics and Gynecology | Admitting: Obstetrics and Gynecology

## 2012-11-13 DIAGNOSIS — Z113 Encounter for screening for infections with a predominantly sexual mode of transmission: Secondary | ICD-10-CM | POA: Insufficient documentation

## 2012-11-13 DIAGNOSIS — Z01419 Encounter for gynecological examination (general) (routine) without abnormal findings: Secondary | ICD-10-CM | POA: Insufficient documentation

## 2013-01-25 ENCOUNTER — Emergency Department (HOSPITAL_COMMUNITY)
Admission: EM | Admit: 2013-01-25 | Discharge: 2013-01-25 | Disposition: A | Discharge status: Home / Self Care | Payer: 59 | Attending: Emergency Medicine | Admitting: Emergency Medicine

## 2013-01-25 ENCOUNTER — Encounter (HOSPITAL_COMMUNITY): Payer: Self-pay | Admitting: Emergency Medicine

## 2013-01-25 DIAGNOSIS — N760 Acute vaginitis: Secondary | ICD-10-CM | POA: Insufficient documentation

## 2013-01-25 DIAGNOSIS — Z8619 Personal history of other infectious and parasitic diseases: Secondary | ICD-10-CM | POA: Insufficient documentation

## 2013-01-25 DIAGNOSIS — B9689 Other specified bacterial agents as the cause of diseases classified elsewhere: Secondary | ICD-10-CM

## 2013-01-25 DIAGNOSIS — F17200 Nicotine dependence, unspecified, uncomplicated: Secondary | ICD-10-CM | POA: Insufficient documentation

## 2013-01-25 DIAGNOSIS — N949 Unspecified condition associated with female genital organs and menstrual cycle: Secondary | ICD-10-CM | POA: Insufficient documentation

## 2013-01-25 DIAGNOSIS — Z872 Personal history of diseases of the skin and subcutaneous tissue: Secondary | ICD-10-CM | POA: Insufficient documentation

## 2013-01-25 LAB — WET PREP, GENITAL: Yeast Wet Prep HPF POC: NONE SEEN

## 2013-01-25 MED ORDER — METRONIDAZOLE 500 MG PO TABS
2000.0000 mg | ORAL_TABLET | Freq: Once | ORAL | Status: AC
  Administered 2013-01-25: 2000 mg via ORAL
  Filled 2013-01-25: qty 4

## 2013-01-25 MED ORDER — ONDANSETRON 4 MG PO TBDP
4.0000 mg | ORAL_TABLET | Freq: Once | ORAL | Status: AC
  Administered 2013-01-25: 4 mg via ORAL
  Filled 2013-01-25: qty 1

## 2013-01-25 NOTE — ED Provider Notes (Signed)
Medical screening examination/treatment/procedure(s) were performed by non-physician practitioner and as supervising physician I was immediately available for consultation/collaboration.  EKG Interpretation   None        Dickson Kostelnik R. Addalynne Golding, MD 01/25/13 1557 

## 2013-01-25 NOTE — ED Provider Notes (Signed)
CSN: [id number]     Arrival date & time 01/25/13  1509 History   First MD Initiated Contact with Patient 01/25/13 1515     Chief Complaint  Patient presents with  . Vaginal Discharge   (Consider location/radiation/quality/duration/timing/severity/associated sxs/prior Treatment) Patient is a 22 y.o. female presenting with vaginal discharge.  Vaginal Discharge   Karen Mcmahon is a 22 y.o.female with a significant PMH of tichomonas, gonorrhea, yeast and bacterial infections of the vagina  presents to the ER with complaints of vaginal discharge for 1 week. She was last sexually active + 3 months ago and has had 3 normal periods since then. The discharge is green/whitish and she is having some mild pelvic pain with it. Denies nausea, vomiting, diarrhea, fevers, abdominal swelling or weight gain.   Past Medical History  Diagnosis Date  . Pregnant   . Trichomonas contact, treated   . Hx of gonorrhea   . Keloid     Left Upper Lateral Earlobe   Past Surgical History  Procedure Laterality Date  . External ear surgery     Family History  Problem Relation Age of Onset  . Other Neg Hx   . Diabetes Mother   . Hypertension Mother    History  Substance Use Topics  . Smoking status: Current Every Day Smoker    Last Attempt to Quit: 05/20/2011  . Smokeless tobacco: Never Used  . Alcohol Use: Yes     Comment: occ   OB History   Grav Para Term Preterm Abortions TAB SAB Ect Mult Living   3 1 1  2 1 1   1      Review of Systems  Genitourinary: Positive for vaginal discharge.    Review of Systems  Gen: no weight loss, fevers, chills, night sweats  Eyes: no discharge or drainage, no occular pain or visual changes  Nose: no epistaxis or rhinorrhea  Mouth: no dental pain, no sore throat  Neck: no neck pain  Lungs:No wheezing, coughing or hemoptysis CV: no chest pain, palpitations, dependent edema or orthopnea  Abd: no abdominal pain, nausea, vomiting  GU: no dysuria or gross  hematuria  , + vaginal discharge MSK:  No abnormalities  Neuro: no headache, no focal neurologic deficits  Skin: no abnormalities Psyche: negative.   Allergies  Review of patient's allergies indicates no known allergies.  Home Medications   Current Outpatient Rx  Name  Route  Sig  Dispense  Refill  . azithromycin (ZITHROMAX) 500 MG tablet   Oral   Take 2 tablets (1,000 mg total) by mouth once.   2 tablet   0   . cefTRIAXone (ROCEPHIN) 250 MG injection   Intramuscular   Inject 250 mg into the muscle once. FOR IM use in LARGE MUSCLE MASS   1 each   0    BP 113/63  Pulse 84  Temp(Src) 97.7 F (36.5 C) (Oral)  Resp 20  Ht 5\' 8"  (1.727 m)  Wt 178 lb 8 oz (80.967 kg)  BMI 27.15 kg/m2  SpO2 100%  LMP 12/30/2012 Physical Exam  Nursing note and vitals reviewed. Constitutional: She appears well-developed and well-nourished. No distress.  HENT:  Head: Normocephalic and atraumatic.  Eyes: Pupils are equal, round, and reactive to light.  Neck: Normal range of motion. Neck supple.  Cardiovascular: Normal rate and regular rhythm.   Pulmonary/Chest: Effort normal.  Abdominal: Soft.  Genitourinary: Uterus normal. Cervix exhibits no motion tenderness. Right adnexum displays no mass, no tenderness and no fullness.  Left adnexum displays no mass, no tenderness and no fullness. No bleeding around the vagina. No foreign body around the vagina. Vaginal discharge found.  Neurological: She is alert.  Skin: Skin is warm and dry.    ED Course  Procedures (including critical care time) Labs Review Labs Reviewed  WET PREP, GENITAL - Abnormal; Notable for the following:    Clue Cells Wet Prep HPF POC MANY (*)    WBC, Wet Prep HPF POC MANY (*)    All other components within normal limits  GC/CHLAMYDIA PROBE AMP   Imaging Review No results found.  EKG Interpretation   None       MDM   1. Bacterial vaginosis     Patients wet prep shows many WBC and clue cells. Patient  chooses to wait for phone call before treated for Gonorrhea or chlamydia. Will have flow manager call her if positive.  Otherwise patient given 2 mg PO flagyl.   Advised to not be sexually active for 1 week, unless otherwise specified by GC cultures.  22 y.o.Karen Mcmahon's evaluation in the Emergency Department is complete. It has been determined that no acute conditions requiring further emergency intervention are present at this time. The patient/guardian have been advised of the diagnosis and plan. We have discussed signs and symptoms that warrant return to the ED, such as changes or worsening in symptoms.  Vital signs are stable at discharge. Filed Vitals:   01/25/13 1526  BP: 113/63  Pulse: 84  Temp:   Resp: 20    Patient/guardian has voiced understanding and agreed to follow-up with the PCP or specialist.      Dorthula Matas, PA-C 01/25/13 1551

## 2013-01-25 NOTE — ED Notes (Signed)
Pt complaining of discharge from the vagina x 1 week. Pt states the discharge is yellow. Pt denies sexual activity for the last 3 months. Pt states she has a hx of yeast and bacterial infections. LMP was early November.

## 2013-01-27 LAB — GC/CHLAMYDIA PROBE AMP: CT Probe RNA: NEGATIVE

## 2013-02-13 ENCOUNTER — Emergency Department (HOSPITAL_COMMUNITY)
Admission: EM | Admit: 2013-02-13 | Discharge: 2013-02-13 | Discharge status: Against Medical Advice | Payer: 59 | Attending: Emergency Medicine | Admitting: Emergency Medicine

## 2013-02-13 ENCOUNTER — Encounter (HOSPITAL_COMMUNITY): Payer: Self-pay | Admitting: Emergency Medicine

## 2013-02-13 DIAGNOSIS — Z8619 Personal history of other infectious and parasitic diseases: Secondary | ICD-10-CM | POA: Insufficient documentation

## 2013-02-13 DIAGNOSIS — N898 Other specified noninflammatory disorders of vagina: Secondary | ICD-10-CM | POA: Insufficient documentation

## 2013-02-13 DIAGNOSIS — Z202 Contact with and (suspected) exposure to infections with a predominantly sexual mode of transmission: Secondary | ICD-10-CM | POA: Insufficient documentation

## 2013-02-13 DIAGNOSIS — L910 Hypertrophic scar: Secondary | ICD-10-CM | POA: Insufficient documentation

## 2013-02-13 DIAGNOSIS — Z2089 Contact with and (suspected) exposure to other communicable diseases: Secondary | ICD-10-CM | POA: Insufficient documentation

## 2013-02-13 LAB — URINALYSIS, ROUTINE W REFLEX MICROSCOPIC
Bilirubin Urine: NEGATIVE
Hgb urine dipstick: NEGATIVE
Protein, ur: NEGATIVE mg/dL
Urobilinogen, UA: 0.2 mg/dL (ref 0.0–1.0)

## 2013-02-13 NOTE — ED Notes (Signed)
Pt seen here recently and treated for BV; pt sts continued vaginal discharge and odor; pt sts LMP was in December

## 2013-02-22 ENCOUNTER — Emergency Department (HOSPITAL_COMMUNITY)
Admission: EM | Admit: 2013-02-22 | Discharge: 2013-02-22 | Disposition: A | Discharge status: Home / Self Care | Payer: 59 | Attending: Emergency Medicine | Admitting: Emergency Medicine

## 2013-02-22 ENCOUNTER — Encounter (HOSPITAL_COMMUNITY): Payer: Self-pay | Admitting: Emergency Medicine

## 2013-02-22 DIAGNOSIS — F17200 Nicotine dependence, unspecified, uncomplicated: Secondary | ICD-10-CM | POA: Insufficient documentation

## 2013-02-22 DIAGNOSIS — Z872 Personal history of diseases of the skin and subcutaneous tissue: Secondary | ICD-10-CM | POA: Insufficient documentation

## 2013-02-22 DIAGNOSIS — Z8619 Personal history of other infectious and parasitic diseases: Secondary | ICD-10-CM | POA: Insufficient documentation

## 2013-02-22 DIAGNOSIS — N898 Other specified noninflammatory disorders of vagina: Secondary | ICD-10-CM | POA: Insufficient documentation

## 2013-02-22 DIAGNOSIS — Z3202 Encounter for pregnancy test, result negative: Secondary | ICD-10-CM | POA: Insufficient documentation

## 2013-02-22 DIAGNOSIS — R109 Unspecified abdominal pain: Secondary | ICD-10-CM | POA: Insufficient documentation

## 2013-02-22 LAB — URINALYSIS, ROUTINE W REFLEX MICROSCOPIC
Bilirubin Urine: NEGATIVE
Glucose, UA: NEGATIVE mg/dL
Hgb urine dipstick: NEGATIVE
Ketones, ur: NEGATIVE mg/dL
Leukocytes, UA: NEGATIVE
Nitrite: NEGATIVE
Protein, ur: NEGATIVE mg/dL
Specific Gravity, Urine: 1.026 (ref 1.005–1.030)
Urobilinogen, UA: 0.2 mg/dL (ref 0.0–1.0)
pH: 6 (ref 5.0–8.0)

## 2013-02-22 LAB — WET PREP, GENITAL
Trich, Wet Prep: NONE SEEN
Yeast Wet Prep HPF POC: NONE SEEN

## 2013-02-22 LAB — POCT PREGNANCY, URINE: Preg Test, Ur: NEGATIVE

## 2013-02-22 NOTE — ED Provider Notes (Signed)
Medical screening examination/treatment/procedure(s) were performed by non-physician practitioner and as supervising physician I was immediately available for consultation/collaboration.  EKG Interpretation   None         Richardean Canal, MD 02/22/13 1539

## 2013-02-22 NOTE — ED Provider Notes (Signed)
CSN: [id number]     Arrival date & time 02/22/13  4098 History   First MD Initiated Contact with Patient 02/22/13 [phone]     Chief Complaint  Patient presents with  . Vaginal Discharge   (Consider location/radiation/quality/duration/timing/severity/associated sxs/prior Treatment) HPI Pt is a 22yo female with hx of gonorrhea and tx for trichomonas.  Pt seen several times in recent months for vaginal discharge.  Reports being seen on Thanksgiving and dx with BV.  States she was given 4 pills here, cannot recall name of medication, but states it did not work.  Today, pt c/o yellow malodorous vaginal discharge. Pt states it has not improved since her last visit but she tried to come before and wait was too long.  Reports intermittent abdominal cramping but denies pain at this time. Denies fever, n/v/d. Denies urinary symptoms. LMP-12/2. Normal per pt. Denies recent sexual contact. Pt has Mirana for birth control. Has been in place for 43yr.   Past Medical History  Diagnosis Date  . Pregnant   . Trichomonas contact, treated   . Hx of gonorrhea   . Keloid     Left Upper Lateral Earlobe   Past Surgical History  Procedure Laterality Date  . External ear surgery     Family History  Problem Relation Age of Onset  . Other Neg Hx   . Diabetes Mother   . Hypertension Mother    History  Substance Use Topics  . Smoking status: Current Every Day Smoker -- 0.50 packs/day    Types: Cigarettes    Last Attempt to Quit: 05/20/2011  . Smokeless tobacco: Never Used  . Alcohol Use: No     Comment: occ   OB History   Grav Para Term Preterm Abortions TAB SAB Ect Mult Living   3 1 1  2 1 1   1      Review of Systems  Constitutional: Negative for fever and chills.  Gastrointestinal: Negative for nausea, vomiting, abdominal pain and diarrhea.  Genitourinary: Positive for vaginal discharge. Negative for dysuria, urgency, frequency, hematuria, flank pain, vaginal bleeding, genital sores, vaginal pain,  menstrual problem and pelvic pain.  All other systems reviewed and are negative.    Allergies  Review of patient's allergies indicates no known allergies.  Home Medications  No current outpatient prescriptions on file. BP 117/67  Pulse 82  Temp(Src) 97.9 F (36.6 C) (Oral)  Resp 18  SpO2 100%  LMP 01/30/2013 Physical Exam  Nursing note and vitals reviewed. Constitutional: She appears well-developed and well-nourished. No distress.  HENT:  Head: Normocephalic and atraumatic.  Eyes: Conjunctivae are normal. No scleral icterus.  Neck: Normal range of motion.  Cardiovascular: Normal rate, regular rhythm and normal heart sounds.   Pulmonary/Chest: Effort normal and breath sounds normal. No respiratory distress. She has no wheezes. She has no rales. She exhibits no tenderness.  Abdominal: Soft. Bowel sounds are normal. She exhibits no distension and no mass. There is no tenderness. There is no rebound and no guarding.  Genitourinary: Uterus normal. No labial fusion. There is no rash, tenderness, lesion or injury on the right labia. There is no rash, tenderness, lesion or injury on the left labia. Cervix exhibits no motion tenderness, no discharge and no friability. Right adnexum displays no mass, no tenderness and no fullness. Left adnexum displays no mass, no tenderness and no fullness. No erythema, tenderness or bleeding around the vagina. No foreign body around the vagina. No signs of injury around the vagina. Vaginal discharge (  scant amount white-yellow ) found.  No CMT, adnexal tenderness or masses.  Musculoskeletal: Normal range of motion.  Neurological: She is alert.  Skin: Skin is warm and dry. She is not diaphoretic.    ED Course  Procedures (including critical care time) Labs Review Labs Reviewed  WET PREP, GENITAL - Abnormal; Notable for the following:    Clue Cells Wet Prep HPF POC FEW (*)    WBC, Wet Prep HPF POC MANY (*)    All other components within normal limits   GC/CHLAMYDIA PROBE AMP  URINALYSIS, ROUTINE W REFLEX MICROSCOPIC  POCT PREGNANCY, URINE   Imaging Review No results found.  EKG Interpretation   None       MDM   1. Vaginal discharge    Pt c/o malodorous vaginal discharge, has not improved since last treatment in ED on Thanksgiving for BV. Denies pain or vaginal bleeding. Denies fever, n/v/d.   Pelvic exam: scant amount of white-yellow discharge. No CMT, adnexal tenderness or masses. No vaginal bleeding.  Urine preg: negative UA: unremarkable Wet prep: unremarkable.  Will not repeat BV treatment with just few clue cells. Ad[facility] care with a gynecologist. Return precautions provided.  Pt verbalized understanding and agreement with tx plan.   Junius Finner, PA-C 02/22/13 1109

## 2013-02-22 NOTE — ED Notes (Signed)
Pt reports to the ED for eval of continued vaginal d/c. Pt reports she was seen on Thanksgiving and dx with bacterial vaginosis. Pt was given several abx here but cannot recall the name. Pt reports she is still having pale yellow vaginal d/c. Pt reports intermittent abdominal cramping but denies any pain at this time. LMP- 12/2. Pt denies any vaginal bleeding, N/V/D, fevers, or chills. Pt also denies any recent sexual contact. Pt A&O x4, resp e/u, and skin warm and dry.

## 2013-02-23 LAB — GC/CHLAMYDIA PROBE AMP
CT Probe RNA: NEGATIVE
GC Probe RNA: NEGATIVE

## 2013-05-09 ENCOUNTER — Emergency Department (HOSPITAL_COMMUNITY)
Admission: EM | Admit: 2013-05-09 | Discharge: 2013-05-09 | Disposition: A | Discharge status: Home / Self Care | Payer: 59 | Attending: Emergency Medicine | Admitting: Emergency Medicine

## 2013-05-09 ENCOUNTER — Encounter (HOSPITAL_COMMUNITY): Payer: Self-pay | Admitting: Emergency Medicine

## 2013-05-09 DIAGNOSIS — Z8669 Personal history of other diseases of the nervous system and sense organs: Secondary | ICD-10-CM | POA: Insufficient documentation

## 2013-05-09 DIAGNOSIS — B9789 Other viral agents as the cause of diseases classified elsewhere: Secondary | ICD-10-CM | POA: Insufficient documentation

## 2013-05-09 DIAGNOSIS — J029 Acute pharyngitis, unspecified: Secondary | ICD-10-CM

## 2013-05-09 DIAGNOSIS — B349 Viral infection, unspecified: Secondary | ICD-10-CM

## 2013-05-09 DIAGNOSIS — Z9889 Other specified postprocedural states: Secondary | ICD-10-CM | POA: Insufficient documentation

## 2013-05-09 DIAGNOSIS — Z8619 Personal history of other infectious and parasitic diseases: Secondary | ICD-10-CM | POA: Insufficient documentation

## 2013-05-09 DIAGNOSIS — F17200 Nicotine dependence, unspecified, uncomplicated: Secondary | ICD-10-CM | POA: Insufficient documentation

## 2013-05-09 LAB — RAPID STREP SCREEN (MED CTR MEBANE ONLY): Streptococcus, Group A Screen (Direct): NEGATIVE

## 2013-05-09 MED ORDER — GUAIFENESIN 100 MG/5ML PO LIQD: 100.0000 mg | ORAL | Status: DC | PRN

## 2013-05-09 MED ORDER — HYDROCODONE-ACETAMINOPHEN 7.5-325 MG/15ML PO SOLN: 10.0000 mL | Freq: Four times a day (QID) | ORAL | Status: DC | PRN

## 2013-05-09 NOTE — ED Provider Notes (Signed)
CSN: [id number]     Arrival date & time 05/09/13  [id number] History   First MD Initiated Contact with Patient 05/09/13 1047     Chief Complaint  Patient presents with  . Sore Throat  . Diarrhea     (Consider location/radiation/quality/duration/timing/severity/associated sxs/prior Treatment) HPI  23 year old female presents complaining of URI symptoms. Patient reports for the past 4 days she has had persistent sore throat. Throat is worsening with swallowing. She also complaining of mild headache, nasal congestions, sneezing, cough and occasional loose stools. She tries over-the-counter medication including liquid Tylenol and cough medication with minimal relief. She endorses feeling hot but denies fever. Reports decreased appetite. She denies neck stiffness, voice changes, pleuritic chest pain, shortness of breath, abdominal pain, nausea or vomiting. Denies any recent antibiotic use. Patient has no other complaints.  Past Medical History  Diagnosis Date  . Pregnant   . Trichomonas contact, treated   . Hx of gonorrhea   . Keloid     Left Upper Lateral Earlobe   Past Surgical History  Procedure Laterality Date  . External ear surgery     Family History  Problem Relation Age of Onset  . Other Neg Hx   . Diabetes Mother   . Hypertension Mother    History  Substance Use Topics  . Smoking status: Current Every Day Smoker -- 0.50 packs/day    Types: Cigarettes    Last Attempt to Quit: 05/20/2011  . Smokeless tobacco: Never Used  . Alcohol Use: No     Comment: occ   OB History   Grav Para Term Preterm Abortions TAB SAB Ect Mult Living   3 1 1  2 1 1   1      Review of Systems  Constitutional: Negative for fever.  HENT: Positive for sore throat.   Respiratory: Positive for cough.   Gastrointestinal: Negative for abdominal pain.  Musculoskeletal: Negative for back pain.  Skin: Negative for rash.      Allergies  Review of patient's allergies indicates no known  allergies.  Home Medications   Current Outpatient Rx  Name  Route  Sig  Dispense  Refill  . PRESCRIPTION MEDICATION   Oral   Take 1 tablet by mouth daily. BIRTH CONTROL          BP 119/79  Pulse 89  Temp(Src) 98 F (36.7 C) (Oral)  SpO2 98%  LMP 05/04/2013 Physical Exam  Nursing note and vitals reviewed. Constitutional: She appears well-developed and well-nourished. No distress.  HENT:  Head: Atraumatic.  Ears: Bilateral TMs appears normal  Nose: Normal nares, no rhinorrhea  Throat: Uvula is midline, bilateral tonsillar enlargement and exudates. No evidence of peritonsillar abscess and no evidence of deep tissue infection. No trismus.  Eyes: Conjunctivae are normal.  Neck: Neck supple. No JVD present. No tracheal deviation present.  No nuchal rigidity  Cardiovascular: Normal rate and regular rhythm.   Pulmonary/Chest: Effort normal and breath sounds normal.  Abdominal: Soft. Bowel sounds are normal. She exhibits no distension. There is no tenderness. There is no rebound.  Lymphadenopathy:    She has cervical adenopathy (Anterior cervical lymphadenopathy).  Neurological: She is alert.  Skin: No rash noted.  Psychiatric: She has a normal mood and affect.    ED Course  Procedures (including critical care time)  Pt here with viral symptoms. She does have bilateral tonsillar enlargement and exudate and c/o sore throat. Rapid strep test obtained. Otherwise she is tolerating her secretions, is afebrile with stable normal vital  sign and appears nontoxic.  12:28 PM Rapid strep test negative.  Throat culture sent.  Likely URI, will treat sxs.  Return precaution discussed.    Labs Review Labs Reviewed  RAPID STREP SCREEN  CULTURE, GROUP A STREP   Imaging Review No results found.   EKG Interpretation None      MDM   Final diagnoses:  Viral syndrome  Pharyngitis    BP 105/61  Pulse 65  Temp(Src) 98 F (36.7 C) (Oral)  Resp 16  SpO2 99%  LMP  05/04/2013  I have reviewed nursing notes and vital signs.  I reviewed available ER/hospitalization records thought the EMR     Fayrene Helper, [place] 05/09/13 1228

## 2013-05-09 NOTE — Discharge Instructions (Signed)

## 2013-05-09 NOTE — ED Notes (Signed)
Pt c/o sore throat and diarrhea X 4 days. sts she has been having diarrhea 3x a day for the past 4 days. Denies abd pain/n/v. sts she has been taking liquid tylenol and cough drops at home but no relief. Denies productive cough/nasal drainage. Nad, skin warm and dry, resp e/u.

## 2013-05-10 NOTE — ED Provider Notes (Signed)
Medical screening examination/treatment/procedure(s) were performed by non-physician practitioner and as supervising physician I was immediately available for consultation/collaboration.   EKG Interpretation None        Candyce ChurnJohn David Elayna Tobler III, MD 05/10/13 [phone]

## 2013-05-11 LAB — CULTURE, GROUP A STREP

## 2013-12-31 ENCOUNTER — Encounter (HOSPITAL_COMMUNITY): Payer: Self-pay | Admitting: Emergency Medicine

## 2014-03-06 ENCOUNTER — Other Ambulatory Visit (HOSPITAL_COMMUNITY)
Admission: RE | Admit: 2014-03-06 | Discharge: 2014-03-06 | Disposition: A | Discharge status: Home / Self Care | Payer: 59 | Source: Ambulatory Visit | Attending: Obstetrics and Gynecology | Admitting: Obstetrics and Gynecology

## 2014-03-06 ENCOUNTER — Other Ambulatory Visit: Payer: Self-pay | Admitting: Obstetrics and Gynecology

## 2014-03-06 DIAGNOSIS — Z113 Encounter for screening for infections with a predominantly sexual mode of transmission: Secondary | ICD-10-CM | POA: Insufficient documentation

## 2014-03-06 DIAGNOSIS — Z01419 Encounter for gynecological examination (general) (routine) without abnormal findings: Secondary | ICD-10-CM | POA: Diagnosis not present

## 2014-03-07 LAB — CYTOLOGY - PAP

## 2014-03-26 ENCOUNTER — Encounter (HOSPITAL_COMMUNITY): Payer: Self-pay | Admitting: Pharmacy Technician

## 2014-03-27 ENCOUNTER — Other Ambulatory Visit (HOSPITAL_COMMUNITY): Payer: Self-pay | Admitting: Otolaryngology

## 2014-03-29 ENCOUNTER — Encounter (HOSPITAL_COMMUNITY)
Admission: RE | Admit: 2014-03-29 | Discharge: 2014-03-29 | Disposition: A | Discharge status: Home / Self Care | Payer: 59 | Source: Ambulatory Visit | Attending: Otolaryngology | Admitting: Otolaryngology

## 2014-03-29 ENCOUNTER — Encounter (HOSPITAL_COMMUNITY): Payer: Self-pay

## 2014-03-29 DIAGNOSIS — M1990 Unspecified osteoarthritis, unspecified site: Secondary | ICD-10-CM | POA: Diagnosis not present

## 2014-03-29 DIAGNOSIS — L910 Hypertrophic scar: Secondary | ICD-10-CM | POA: Diagnosis not present

## 2014-03-29 DIAGNOSIS — Z87891 Personal history of nicotine dependence: Secondary | ICD-10-CM | POA: Diagnosis not present

## 2014-03-29 HISTORY — DX: Unspecified osteoarthritis, unspecified site: M19.90

## 2014-03-29 LAB — COMPREHENSIVE METABOLIC PANEL
ALT: 22 U/L (ref 0–35)
AST: 19 U/L (ref 0–37)
Albumin: 3.9 g/dL (ref 3.5–5.2)
Alkaline Phosphatase: 56 U/L (ref 39–117)
Anion gap: 3 — ABNORMAL LOW (ref 5–15)
BUN: 12 mg/dL (ref 6–23)
CHLORIDE: 108 mmol/L (ref 96–112)
CO2: 28 mmol/L (ref 19–32)
Calcium: 9 mg/dL (ref 8.4–10.5)
Creatinine, Ser: 0.87 mg/dL (ref 0.50–1.10)
GFR calc Af Amer: >90 mL/min (ref >90)
Glucose, Bld: 88 mg/dL (ref 70–99)
Potassium: 3.8 mmol/L (ref 3.5–5.1)
Sodium: 139 mmol/L (ref 135–145)
TOTAL PROTEIN: 6.9 g/dL (ref 6.0–8.3)
Total Bilirubin: 0.7 mg/dL (ref 0.3–1.2)

## 2014-03-29 LAB — CBC
HEMATOCRIT: 38.5 % (ref 36.0–46.0)
Hemoglobin: 12.7 g/dL (ref 12.0–15.0)
MCH: 27.6 pg (ref 26.0–34.0)
MCHC: 33 g/dL (ref 30.0–36.0)
MCV: 83.7 fL (ref 78.0–100.0)
Platelets: 218 10*3/uL (ref 150–400)
RBC: 4.6 MIL/uL (ref 3.87–5.11)
RDW: 13.5 % (ref 11.5–15.5)
WBC: 4.2 10*3/uL (ref 4.0–10.5)

## 2014-03-29 LAB — HCG, SERUM, QUALITATIVE: Preg, Serum: NEGATIVE

## 2014-03-29 NOTE — Pre-Procedure Instructions (Addendum)
Roselle LocusOnya J Blust  03/29/2014   Your procedure is scheduled on:  04/05/14  [facility] at 530 AM.  Call this number if you have problems the morning of surgery: [phone]   Remember:   Do not eat food or drink liquids after midnight.   Take these medicines the morning of surgery with A SIP OF WATER: none     STOP all herbel meds, nsaids (aleve,naproxen,advil,ibuprofen) 5 days prior to surgery starting sun 03/31/14 including vitamins, aspirin, diclfenac   Do not wear jewelry, make-up or nail polish.  Do not wear lotions, powders, or perfumes. You may wear deodorant.  Do not shave 48 hours prior to surgery. Men may shave face and neck.  Do not bring valuables to the hospital.  [facility] is not responsible                  for any belongings or valuables.               Contacts, dentures or bridgework may not be worn into surgery.  Leave suitcase in the car. After surgery it may be brought to your room.  For patients admitted to the hospital, discharge time is determined by your                treatment team.               Patients discharged the day of surgery will not be allowed to drive  home.  Name and phone number of your driver:   Special Instructions:  Special Instructions: [place] - Preparing for Surgery  Before surgery, you can play an important role.  Because skin is not sterile, your skin needs to be as free of germs as possible.  You can reduce the number of germs on you skin by washing with CHG (chlorahexidine gluconate) soap before surgery.  CHG is an antiseptic cleaner which kills germs and bonds with the skin to continue killing germs even after washing.  Please DO NOT use if you have an allergy to CHG or antibacterial soaps.  If your skin becomes reddened/irritated stop using the CHG and inform your nurse when you arrive at Short Stay.  Do not shave (including legs and underarms) for at least 48 hours prior to the first CHG shower.  You may  shave your face.  Please follow these instructions carefully:   1.  Shower with CHG Soap the night before surgery and the morning of Surgery.  2.  If you choose to wash your hair, wash your hair first as usual with your normal shampoo.  3.  After you shampoo, rinse your hair and body thoroughly to remove the Shampoo.  4.  Use CHG as you would any other liquid soap.  You can apply chg directly  to the skin and wash gently with scrungie or a clean washcloth.  5.  Apply the CHG Soap to your body ONLY FROM THE NECK DOWN.  Do not use on open wounds or open sores.  Avoid contact with your eyes ears, mouth and genitals (private parts).  Wash genitals (private parts)       with your normal soap.  6.  Wash thoroughly, paying special attention to the area where your surgery will be performed.  7.  Thoroughly rinse your body with warm water from the neck down.  8.  DO NOT shower/wash with your normal soap after using and rinsing off the CHG Soap.  9.  Pat yourself dry with a clean towel.            10.  Wear clean pajamas.            11.  Place clean sheets on your bed the night of your first shower and do not sleep with pets.  Day of Surgery  Do not apply any lotions/deodorants the morning of surgery.  Please wear clean clothes to the hospital/surgery center.   Please read over the following fact sheets that you were given: Pain Booklet, Coughing and Deep Breathing and Surgical Site Infection Prevention

## 2014-04-05 ENCOUNTER — Ambulatory Visit (HOSPITAL_COMMUNITY): Payer: 59 | Admitting: Anesthesiology

## 2014-04-05 ENCOUNTER — Encounter (HOSPITAL_COMMUNITY): Payer: Self-pay | Admitting: *Deleted

## 2014-04-05 ENCOUNTER — Encounter (HOSPITAL_COMMUNITY)
Admission: RE | Disposition: A | Discharge status: Home / Self Care | Payer: Self-pay | Source: Ambulatory Visit | Attending: Otolaryngology

## 2014-04-05 ENCOUNTER — Ambulatory Visit (HOSPITAL_COMMUNITY)
Admission: RE | Admit: 2014-04-05 | Discharge: 2014-04-05 | Disposition: A | Discharge status: Home / Self Care | Payer: 59 | Source: Ambulatory Visit | Attending: Otolaryngology | Admitting: Otolaryngology

## 2014-04-05 DIAGNOSIS — L910 Hypertrophic scar: Secondary | ICD-10-CM | POA: Insufficient documentation

## 2014-04-05 DIAGNOSIS — Z87891 Personal history of nicotine dependence: Secondary | ICD-10-CM | POA: Insufficient documentation

## 2014-04-05 DIAGNOSIS — M1990 Unspecified osteoarthritis, unspecified site: Secondary | ICD-10-CM | POA: Insufficient documentation

## 2014-04-05 HISTORY — PX: EAR CYST EXCISION: SHX22

## 2014-04-05 SURGERY — EXCISION, CYST, EAR
Anesthesia: General | Site: Ear | Laterality: Left

## 2014-04-05 MED ORDER — LACTATED RINGERS IV SOLN
INTRAVENOUS | Status: DC | PRN
  Administered 2014-04-05: 07:00:00 via INTRAVENOUS

## 2014-04-05 MED ORDER — TRIAMCINOLONE ACETONIDE 40 MG/ML IJ SUSP
INTRAMUSCULAR | Status: AC
  Filled 2014-04-05: qty 5

## 2014-04-05 MED ORDER — BACITRACIN ZINC 500 UNIT/GM EX OINT
TOPICAL_OINTMENT | CUTANEOUS | Status: DC | PRN
  Administered 2014-04-05: 1 via TOPICAL

## 2014-04-05 MED ORDER — LIDOCAINE-EPINEPHRINE 1 %-1:100000 IJ SOLN
INTRAMUSCULAR | Status: DC | PRN
  Administered 2014-04-05: 20 mL

## 2014-04-05 MED ORDER — CLINDAMYCIN PHOSPHATE 600 MG/50ML IV SOLN
600.0000 mg | INTRAVENOUS | Status: DC
  Filled 2014-04-05: qty 50

## 2014-04-05 MED ORDER — MIDAZOLAM HCL 2 MG/2ML IJ SOLN
INTRAMUSCULAR | Status: AC
  Filled 2014-04-05: qty 2

## 2014-04-05 MED ORDER — ROCURONIUM BROMIDE 50 MG/5ML IV SOLN
INTRAVENOUS | Status: AC
  Filled 2014-04-05: qty 1

## 2014-04-05 MED ORDER — ONDANSETRON HCL 4 MG/2ML IJ SOLN
INTRAMUSCULAR | Status: AC
  Filled 2014-04-05: qty 2

## 2014-04-05 MED ORDER — SODIUM CHLORIDE 0.9 % IJ SOLN
INTRAMUSCULAR | Status: AC
  Filled 2014-04-05: qty 10

## 2014-04-05 MED ORDER — FENTANYL CITRATE 0.05 MG/ML IJ SOLN
INTRAMUSCULAR | Status: AC
  Filled 2014-04-05: qty 5

## 2014-04-05 MED ORDER — MIDAZOLAM HCL 5 MG/5ML IJ SOLN
INTRAMUSCULAR | Status: DC | PRN
  Administered 2014-04-05: 2 mg via INTRAVENOUS

## 2014-04-05 MED ORDER — SUCCINYLCHOLINE CHLORIDE 20 MG/ML IJ SOLN
INTRAMUSCULAR | Status: DC | PRN
  Administered 2014-04-05: 100 mg via INTRAVENOUS

## 2014-04-05 MED ORDER — PROPOFOL 10 MG/ML IV BOLUS
INTRAVENOUS | Status: AC
  Filled 2014-04-05: qty 20

## 2014-04-05 MED ORDER — BACITRACIN ZINC 500 UNIT/GM EX OINT
TOPICAL_OINTMENT | CUTANEOUS | Status: AC
  Filled 2014-04-05: qty 28.35

## 2014-04-05 MED ORDER — DEXAMETHASONE SODIUM PHOSPHATE 10 MG/ML IJ SOLN
INTRAMUSCULAR | Status: AC
  Filled 2014-04-05: qty 1

## 2014-04-05 MED ORDER — DEXAMETHASONE SODIUM PHOSPHATE 10 MG/ML IJ SOLN
10.0000 mg | Freq: Once | INTRAMUSCULAR | Status: AC
  Administered 2014-04-05: 10 mg via INTRAVENOUS

## 2014-04-05 MED ORDER — LIDOCAINE HCL (CARDIAC) 20 MG/ML IV SOLN
INTRAVENOUS | Status: AC
  Filled 2014-04-05: qty 5

## 2014-04-05 MED ORDER — ONDANSETRON HCL 4 MG/2ML IJ SOLN
INTRAMUSCULAR | Status: DC | PRN
  Administered 2014-04-05: 4 mg via INTRAVENOUS

## 2014-04-05 MED ORDER — ARTIFICIAL TEARS OP OINT
TOPICAL_OINTMENT | OPHTHALMIC | Status: AC
  Filled 2014-04-05: qty 3.5

## 2014-04-05 MED ORDER — LIDOCAINE HCL (CARDIAC) 20 MG/ML IV SOLN
INTRAVENOUS | Status: DC | PRN
  Administered 2014-04-05: 50 mg via INTRAVENOUS

## 2014-04-05 MED ORDER — LIDOCAINE-EPINEPHRINE 1 %-1:100000 IJ SOLN
INTRAMUSCULAR | Status: AC
  Filled 2014-04-05: qty 1

## 2014-04-05 MED ORDER — SUCCINYLCHOLINE CHLORIDE 20 MG/ML IJ SOLN
INTRAMUSCULAR | Status: AC
  Filled 2014-04-05: qty 1

## 2014-04-05 MED ORDER — PROPOFOL 10 MG/ML IV BOLUS
INTRAVENOUS | Status: DC | PRN
  Administered 2014-04-05: 100 mg via INTRAVENOUS
  Administered 2014-04-05: 200 mg via INTRAVENOUS

## 2014-04-05 MED ORDER — 0.9 % SODIUM CHLORIDE (POUR BTL) OPTIME
TOPICAL | Status: DC | PRN
  Administered 2014-04-05: 1000 mL

## 2014-04-05 MED ORDER — FENTANYL CITRATE 0.05 MG/ML IJ SOLN
INTRAMUSCULAR | Status: DC | PRN
  Administered 2014-04-05: 50 ug via INTRAVENOUS

## 2014-04-05 MED ORDER — EPHEDRINE SULFATE 50 MG/ML IJ SOLN
INTRAMUSCULAR | Status: AC
  Filled 2014-04-05: qty 1

## 2014-04-05 MED ORDER — TRIAMCINOLONE ACETONIDE 40 MG/ML IJ SUSP
INTRAMUSCULAR | Status: DC | PRN
  Administered 2014-04-05: 40 mg via INTRADERMAL

## 2014-04-05 MED ORDER — CLINDAMYCIN PHOSPHATE 600 MG/50ML IV SOLN
600.0000 mg | Freq: Once | INTRAVENOUS | Status: AC
  Administered 2014-04-05: 600 mg via INTRAVENOUS

## 2014-04-05 SURGICAL SUPPLY — 52 items
AIRSTRIP 4 3/4X3 1/4 7185 (GAUZE/BANDAGES/DRESSINGS) IMPLANT
ATTRACTOMAT 16X20 MAGNETIC DRP (DRAPES) IMPLANT
BNDG CONFORM 2 STRL LF (GAUZE/BANDAGES/DRESSINGS) IMPLANT
BNDG GAUZE ELAST 4 BULKY (GAUZE/BANDAGES/DRESSINGS) IMPLANT
CANISTER SUCTION 2500CC (MISCELLANEOUS) ×3 IMPLANT
CATH ROBINSON RED A/P 16FR (CATHETERS) IMPLANT
CLEANER TIP ELECTROSURG 2X2 (MISCELLANEOUS) ×3 IMPLANT
CONT SPEC 4OZ CLIKSEAL STRL BL (MISCELLANEOUS) IMPLANT
COVER SURGICAL LIGHT HANDLE (MISCELLANEOUS) ×3 IMPLANT
CRADLE DONUT ADULT HEAD (MISCELLANEOUS) ×3 IMPLANT
DRAIN PENROSE 1/4X12 LTX STRL (WOUND CARE) IMPLANT
DRSG EMULSION OIL 3X3 NADH (GAUZE/BANDAGES/DRESSINGS) IMPLANT
ELECT COATED BLADE 2.86 ST (ELECTRODE) ×3 IMPLANT
ELECT NEEDLE TIP 2.8 STRL (NEEDLE) IMPLANT
ELECT REM PT RETURN 9FT ADLT (ELECTROSURGICAL) ×3
ELECTRODE REM PT RTRN 9FT ADLT (ELECTROSURGICAL) ×1 IMPLANT
GAUZE SPONGE 4X4 12PLY STRL (GAUZE/BANDAGES/DRESSINGS) IMPLANT
GAUZE SPONGE 4X4 16PLY XRAY LF (GAUZE/BANDAGES/DRESSINGS) IMPLANT
GLOVE BIO SURGEON STRL SZ7 (GLOVE) ×6 IMPLANT
GLOVE BIOGEL PI IND STRL 7.0 (GLOVE) ×2 IMPLANT
GLOVE BIOGEL PI INDICATOR 7.0 (GLOVE) ×4
GLOVE SURG SS PI 7.5 STRL IVOR (GLOVE) ×6 IMPLANT
GOWN STRL REUS W/ TWL LRG LVL3 (GOWN DISPOSABLE) ×2 IMPLANT
GOWN STRL REUS W/TWL LRG LVL3 (GOWN DISPOSABLE) ×4
KIT BASIN OR (CUSTOM PROCEDURE TRAY) ×3 IMPLANT
KIT ROOM TURNOVER OR (KITS) ×3 IMPLANT
NEEDLE HYPO 20X1 EYE RM 11 (NEEDLE) ×3 IMPLANT
NEEDLE HYPO 25GX1X1/2 BEV (NEEDLE) ×3 IMPLANT
NS IRRIG 1000ML POUR BTL (IV SOLUTION) ×3 IMPLANT
PAD ARMBOARD 7.5X6 YLW CONV (MISCELLANEOUS) ×3 IMPLANT
PENCIL BUTTON HOLSTER BLD 10FT (ELECTRODE) ×3 IMPLANT
SUT CHROMIC 4 0 P 3 18 (SUTURE) IMPLANT
SUT ETHILON 4 0 PS 2 18 (SUTURE) ×3 IMPLANT
SUT ETHILON 5 0 P 3 18 (SUTURE) ×2
SUT NYLON ETHILON 5-0 P-3 1X18 (SUTURE) ×1 IMPLANT
SUT PLAIN 5 0 P 3 18 (SUTURE) IMPLANT
SUT SILK 4 0 (SUTURE) ×2
SUT SILK 4-0 18XBRD TIE 12 (SUTURE) ×1 IMPLANT
SUT VICRYL 4-0 PS2 18IN ABS (SUTURE) ×3 IMPLANT
SWAB COLLECTION DEVICE MRSA (MISCELLANEOUS) IMPLANT
SYR BULB 3OZ (MISCELLANEOUS) IMPLANT
SYR BULB IRRIGATION 50ML (SYRINGE) IMPLANT
SYR TB 1ML LUER SLIP (SYRINGE) ×3 IMPLANT
SYRINGE 10CC LL (SYRINGE) ×3 IMPLANT
TOWEL OR 17X24 6PK STRL BLUE (TOWEL DISPOSABLE) IMPLANT
TOWEL OR 17X26 10 PK STRL BLUE (TOWEL DISPOSABLE) IMPLANT
TRAY ENT MC OR (CUSTOM PROCEDURE TRAY) ×3 IMPLANT
TUBE ANAEROBIC SPECIMEN COL (MISCELLANEOUS) IMPLANT
TUBE CONNECTING 12'X1/4 (SUCTIONS) ×1
TUBE CONNECTING 12X1/4 (SUCTIONS) ×2 IMPLANT
WATER STERILE IRR 1000ML POUR (IV SOLUTION) IMPLANT
YANKAUER SUCT BULB TIP NO VENT (SUCTIONS) ×3 IMPLANT

## 2014-04-05 NOTE — Anesthesia Procedure Notes (Signed)
Procedure Name: Intubation Date/Time: 04/05/2014 7:35 AM Performed by: De NurseENNIE, Kierstyn Baranowski E Pre-anesthesia Checklist: Patient identified, Emergency Drugs available, Suction available, Patient being monitored and Timeout performed Patient Re-evaluated:Patient Re-evaluated prior to inductionOxygen Delivery Method: Circle system utilized Preoxygenation: Pre-oxygenation with 100% oxygen Intubation Type: IV induction Ventilation: Mask ventilation without difficulty Laryngoscope Size: Mac and 3 Grade View: Grade I Tube type: Oral Tube size: 7.0 mm Number of attempts: 1 Airway Equipment and Method: Stylet Placement Confirmation: ETT inserted through vocal cords under direct vision,  positive ETCO2 and breath sounds checked- equal and bilateral Secured at: 22 cm Tube secured with: Tape Dental Injury: Teeth and Oropharynx as per pre-operative assessment

## 2014-04-05 NOTE — Anesthesia Postprocedure Evaluation (Signed)
  Anesthesia Post-op Note  Patient: Karen Mcmahon  Procedure(s) Performed: Procedure(s) with comments: EXCISION EAR CYST (Left) - Excision of keloid  Patient Location: PACU  Anesthesia Type:General  Level of Consciousness: awake and alert   Airway and Oxygen Therapy: Patient Spontanous Breathing  Post-op Pain: none  Post-op Assessment: Post-op Vital signs reviewed  Post-op Vital Signs: Reviewed  Last Vitals:  Filed Vitals:   04/05/14 0858  BP: 115/58  Pulse: 89  Temp:   Resp: 15    Complications: No apparent anesthesia complications

## 2014-04-05 NOTE — Anesthesia Preprocedure Evaluation (Addendum)
Anesthesia Evaluation  Patient identified by MRN, date of birth, ID band Patient awake    Reviewed: Allergy & Precautions, NPO status , Patient's Chart, lab work & pertinent test results  Airway Mallampati: II  TM Distance: >3 FB     Dental  (+) Teeth Intact, Dental Advisory Given   Pulmonary former smoker,          Cardiovascular negative cardio ROS  Rhythm:Regular Rate:Normal     Neuro/Psych negative neurological ROS     GI/Hepatic negative GI ROS, Neg liver ROS,   Endo/Other  negative endocrine ROS  Renal/GU negative Renal ROS     Musculoskeletal   Abdominal   Peds  Hematology negative hematology ROS (+)   Anesthesia Other Findings   Reproductive/Obstetrics                            Anesthesia Physical Anesthesia Plan  ASA: I  Anesthesia Plan: General   Post-op Pain Management:    Induction: Intravenous  Airway Management Planned: Oral ETT and LMA  Additional Equipment:   Intra-op Plan:   Post-operative Plan: Extubation in OR  Informed Consent: I have reviewed the patients History and Physical, chart, labs and discussed the procedure including the risks, benefits and alternatives for the proposed anesthesia with the patient or authorized representative who has indicated his/her understanding and acceptance.   Dental advisory given  Plan Discussed with: CRNA  Anesthesia Plan Comments:         Anesthesia Quick Evaluation

## 2014-04-05 NOTE — Transfer of Care (Signed)
Immediate Anesthesia Transfer of Care Note  Patient: Karen Mcmahon  Procedure(s) Performed: Procedure(s) with comments: EXCISION EAR CYST (Left) - Excision of keloid  Patient Location: PACU  Anesthesia Type:General  Level of Consciousness: awake, alert  and oriented  Airway & Oxygen Therapy: Patient Spontanous Breathing and Patient connected to nasal cannula oxygen  Post-op Assessment: Report given to RN  Post vital signs: Reviewed and stable  Last Vitals:  Filed Vitals:   04/05/14 0815  BP:   Pulse: 108  Temp: 36.7 C  Resp: 22    Complications: No apparent anesthesia complications

## 2014-04-05 NOTE — H&P (Signed)
04/05/2014  Karen Mcmahon, Karen Cristo  PREOPERATIVE HISTORY AND PHYSICAL  CHIEF COMPLAINT: multiply recurrent left ear keloid  HISTORY: This is a 24 year old who presents with multiply recurrent left ear keloid.  She now presents for excision of left ear keloid.  Dr. Emeline DarlingGore, Clovis RileyMitchell has discussed the risks (high risk of recurrence, low risk of bleeding, infection, wound dehiscence, etc.), benefits, and alternatives of this procedure. The patient understands the risks and would like to proceed with the procedure. The chances of success of the procedure are >25% and the patient understands this. I personally performed an examination of the patient within 24 hours of the procedure.  PAST MEDICAL HISTORY: Past Medical History  Diagnosis Date  . Pregnant     hx  . Trichomonas contact, treated   . Hx of gonorrhea   . Keloid     Left Upper Lateral Earlobe  . Arthritis      PAST SURGICAL HISTORY: Past Surgical History  Procedure Laterality Date  . External ear surgery Left 12    MEDICATIONS: No current facility-administered medications on file prior to encounter.   Current Outpatient Prescriptions on File Prior to Encounter  Medication Sig Dispense Refill  . guaiFENesin (ROBITUSSIN) 100 MG/5ML liquid Take 5-10 mLs (100-200 mg total) by mouth every 4 (four) hours as needed for cough. (Patient not taking: Reported on 03/26/2014) 60 mL 0  . HYDROcodone-acetaminophen (HYCET) 7.5-325 mg/15 ml solution Take 10 mLs by mouth 4 (four) times daily as needed for moderate pain. (Patient not taking: Reported on 03/26/2014) 120 mL 0    ALLERGIES: No Known Allergies   SOCIAL HISTORY: History   Social History  . Marital Status: Single    Spouse Name: N/A    Number of Children: N/A  . Years of Education: N/A   Occupational History  . Not on file.   Social History Main Topics  . Smoking status: Former Smoker -- 0.50 packs/day for 2 years    Types: Cigarettes    Quit date: 03/18/2014  . Smokeless  tobacco: Never Used  . Alcohol Use: No  . Drug Use: Yes    Special: Marijuana     Comment: 1 monthlast  . Sexual Activity: Yes   Other Topics Concern  . Not on file   Social History Narrative    FAMILY HISTORY:  Family History  Problem Relation Age of Onset  . Other Neg Hx   . Diabetes Mother   . Hypertension Mother     REVIEW OF SYSTEMS:  HEENT:left ear soreness,otherwise negative x 12 systems except per HPI   PHYSICAL EXAM:  GENERAL:  NAD VITAL SIGNS:   Filed Vitals:   04/05/14 0629  BP: 111/59  Pulse: 69  Temp: 98 F (36.7 C)  Resp: 20   SKIN/HEENT:  Oral cavity normal, left ear shows an ~ 3cm left ear recurrent keloid scar NECK:  supple LYMPH:  No LAD ABDOMEN:  soft MUSCULOSKELETAL: normal  PSYCH:  Normal affect NEUROLOGIC:  CN 2-12 intact and symmetric   ASSESSMENT AND PLAN: Plan to proceed with excision of multiply recurrent left ear keloid. Patient understands the risks, benefits, and alternatives. Informed written consent signed witnessed and on chart. 04/05/2014  7:16 AM Karen Mcmahon, Karen Mcmahon

## 2014-04-05 NOTE — Discharge Instructions (Addendum)
Apply OTC neosporin ointment to incision TID, keep dry for 48 hours then may gently clean with soap and water. Sutures are absorbable. Follow up with Dr. Emeline Darling in 1 to 2 weeks. Regular diet, Rx for pain medication given to family, Rx for antibiotic/zofran sent to pharmacy.  What to eat:  For your first meals, you should eat lightly; only small meals initially.  If you do not have nausea, you may eat larger meals.  Avoid spicy, greasy and heavy food.    General Anesthesia, Adult, Care After  Refer to this sheet in the next few weeks. These instructions provide you with information on caring for yourself after your procedure. Your health care provider may also give you more specific instructions. Your treatment has been planned according to current medical practices, but problems sometimes occur. Call your health care provider if you have any problems or questions after your procedure.  WHAT TO EXPECT AFTER THE PROCEDURE  After the procedure, it is typical to experience:  Sleepiness.  Nausea and vomiting. HOME CARE INSTRUCTIONS  For the first 24 hours after general anesthesia:  Have a responsible person with you.  Do not drive a car. If you are alone, do not take public transportation.  Do not drink alcohol.  Do not take medicine that has not been prescribed by your health care provider.  Do not sign important papers or make important decisions.  You may resume a normal diet and activities as directed by your health care provider.  Change bandages (dressings) as directed.  If you have questions or problems that seem related to general anesthesia, call the hospital and ask for the anesthetist or anesthesiologist on call. SEEK MEDICAL CARE IF:  You have nausea and vomiting that continue the day after anesthesia.  You develop a rash. SEEK IMMEDIATE MEDICAL CARE IF:  You have difficulty breathing.  You have chest pain.  You have any allergic problems. Document Released: 05/24/2000 Document  Revised: 10/18/2012 Document Reviewed: 08/31/2012  [facility] Patient Information 2014 [place], [place].   Sore Throat    A sore throat is a painful, burning, sore, or scratchy feeling of the throat. There may be pain or tenderness when swallowing or talking. You may have other symptoms with a sore throat. These include coughing, sneezing, fever, or a swollen neck. A sore throat is often the first sign of another sickness. These sicknesses may include a cold, flu, strep throat, or an infection called mono. Most sore throats go away without medical treatment.  HOME CARE  Only take medicine as told by your doctor.  Drink enough fluids to keep your pee (urine) clear or pale yellow.  Rest as needed.  Try using throat sprays, lozenges, or suck on hard candy (if older than 4 years or as told).  Sip warm liquids, such as broth, herbal tea, or warm water with honey. Try sucking on frozen ice pops or drinking cold liquids.  Rinse the mouth (gargle) with salt water. Mix 1 teaspoon salt with 8 ounces of water.  Do not smoke. Avoid being around others when they are smoking.  Put a humidifier in your bedroom at night to moisten the air. You can also turn on a hot shower and sit in the bathroom for 5-10 minutes. Be sure the bathroom door is closed. GET HELP RIGHT AWAY IF:  You have trouble breathing.  You cannot swallow fluids, soft foods, or your spit (saliva).  You have more puffiness (swelling) in the throat.  Your sore throat  does not get better in 7 days.  You feel sick to your stomach (nauseous) and throw up (vomit).  You have a fever or lasting symptoms for more than 2-3 days.  You have a fever and your symptoms suddenly get worse. MAKE SURE YOU:  Understand these instructions.  Will watch your condition.  Will get help right away if you are not doing well or get worse. Document Released: 11/25/2007 Document Revised: 11/10/2011 Document Reviewed: 10/24/2011  [facility] Patient Information 2015  [place]. This information is not intended to replace advice given to you by your health care provider. Make sure you discuss any questions you have with your health care provider.

## 2014-04-05 NOTE — Op Note (Signed)
DATE OF OPERATION: 04/05/2014 Surgeon: Melvenia BeamGore, Riese Hellard, MD Procedure Performed: 11443-Left excision of multiply recurrent left ear 3cm keloid  PREOPERATIVE DIAGNOSIS: multiply recurrent left ear 3cm keloid POSTOPERATIVE DIAGNOSIS: multiply recurrent left ear 3cm keloid SURGEON: Melvenia BeamGore, Charla Criscione, MD ANESTHESIA: general via endotracheal ESTIMATED BLOOD LOSS: minimal DRAINS: none SPECIMENS: left ear 3cm keloid sent to pathology INDICATIONS: The patient is a 24 yo female with a history of multiply recurrent left ear keloid. DESCRIPTION OF OPERATION: The patient was brought to the operating room and was placed in the supine position and intubated and placed under general anesthesia by anesthesiology. The left ear keloid was prepped and draped with betadine/sterile drapes and injected with 10mL of 1% lidocaine with epinephrine. An elliptical incision was made around the periphery of the keloid taking care to leave a rim of normal non-keloid skin with the 15 blade, and the keloid along with its deep subcutaneous component was excised from the surrounding normal skin and underlying cartilage. The keloid was passed of for pathology. I then closed the skin after undermining a small amount of skin peripherally and closed the skin with simple interrupted 4-0 chromic sutures. I cleaned off the betadine prep and then injected the skin/subcutaneous tissues with 1mL of 40mg /mL Kenalog and applied Bacitracin ointment. The patient was turned back to anesthesia and awakened from anesthesia  without difficulty. The patient tolerated the procedure well with no immediate complications and was taken to the postoperative recovery area in good condition.   Dr. Melvenia BeamMitchell Netanya Yazdani was present and performed the entire procedure. 04/05/2014 8:16 AM Melvenia BeamGore, Wreatha Sturgeon

## 2014-04-08 ENCOUNTER — Encounter (HOSPITAL_COMMUNITY): Payer: Self-pay | Admitting: Otolaryngology

## 2015-03-24 ENCOUNTER — Encounter (HOSPITAL_COMMUNITY): Payer: Self-pay

## 2015-03-24 ENCOUNTER — Emergency Department (HOSPITAL_COMMUNITY)
Admission: EM | Admit: 2015-03-24 | Discharge: 2015-03-25 | Disposition: A | Discharge status: Home / Self Care | Payer: Commercial Managed Care - HMO | Attending: Emergency Medicine | Admitting: Emergency Medicine

## 2015-03-24 DIAGNOSIS — R1032 Left lower quadrant pain: Secondary | ICD-10-CM | POA: Diagnosis present

## 2015-03-24 DIAGNOSIS — Z3202 Encounter for pregnancy test, result negative: Secondary | ICD-10-CM | POA: Insufficient documentation

## 2015-03-24 DIAGNOSIS — R102 Pelvic and perineal pain: Secondary | ICD-10-CM | POA: Diagnosis not present

## 2015-03-24 DIAGNOSIS — Z87891 Personal history of nicotine dependence: Secondary | ICD-10-CM | POA: Diagnosis not present

## 2015-03-24 DIAGNOSIS — Z791 Long term (current) use of non-steroidal anti-inflammatories (NSAID): Secondary | ICD-10-CM | POA: Diagnosis not present

## 2015-03-24 DIAGNOSIS — M1990 Unspecified osteoarthritis, unspecified site: Secondary | ICD-10-CM | POA: Insufficient documentation

## 2015-03-24 DIAGNOSIS — Z872 Personal history of diseases of the skin and subcutaneous tissue: Secondary | ICD-10-CM | POA: Insufficient documentation

## 2015-03-24 DIAGNOSIS — Z8619 Personal history of other infectious and parasitic diseases: Secondary | ICD-10-CM | POA: Diagnosis not present

## 2015-03-24 LAB — URINALYSIS, ROUTINE W REFLEX MICROSCOPIC
BILIRUBIN URINE: NEGATIVE
GLUCOSE, UA: NEGATIVE mg/dL
Hgb urine dipstick: NEGATIVE
Ketones, ur: NEGATIVE mg/dL
Leukocytes, UA: NEGATIVE
NITRITE: NEGATIVE
Protein, ur: NEGATIVE mg/dL
SPECIFIC GRAVITY, URINE: 1.018 (ref 1.005–1.030)
pH: 6.5 (ref 5.0–8.0)

## 2015-03-24 NOTE — ED Notes (Signed)
Pt complains of right side pain for two days, no vomiting or diarrhea, describes it as a sharp pain

## 2015-03-25 ENCOUNTER — Ambulatory Visit (HOSPITAL_COMMUNITY)
Admission: RE | Admit: 2015-03-25 | Discharge: 2015-03-25 | Disposition: A | Discharge status: Home / Self Care | Payer: Commercial Managed Care - HMO | Source: Ambulatory Visit | Attending: Emergency Medicine | Admitting: Emergency Medicine

## 2015-03-25 DIAGNOSIS — R102 Pelvic and perineal pain: Secondary | ICD-10-CM

## 2015-03-25 DIAGNOSIS — N83291 Other ovarian cyst, right side: Secondary | ICD-10-CM | POA: Insufficient documentation

## 2015-03-25 LAB — PREGNANCY, URINE: Preg Test, Ur: NEGATIVE

## 2015-03-25 MED ORDER — HYDROCODONE-ACETAMINOPHEN 5-325 MG PO TABS: 1.0000 | ORAL_TABLET | Freq: Four times a day (QID) | ORAL | Status: DC | PRN

## 2015-03-25 MED ORDER — HYDROCODONE-ACETAMINOPHEN 5-325 MG PO TABS
1.0000 | ORAL_TABLET | Freq: Once | ORAL | Status: AC
  Administered 2015-03-25: 1 via ORAL
  Filled 2015-03-25: qty 1

## 2015-03-25 NOTE — ED Notes (Signed)
Patient was alert, oriented and stable upon discharge. RN went over AVS and patient had no further questions.  

## 2015-03-25 NOTE — ED Provider Notes (Signed)
CSN: [id number]     Arrival date & time 03/24/15  2051 History  By signing my name below, I, Budd Palmer, attest that this documentation has been prepared under the direction and in the presence of Paula Libra, MD. Electronically Signed: Budd Palmer, ED Scribe. 03/25/2015. 12:42 AM.    Chief Complaint  Patient presents with  . Abdominal Pain   The history is provided by the patient. No language interpreter was used.   HPI Comments: Karen Mcmahon is a 25 y.o. female who presents to the Emergency Department complaining of sharp, intermittent, LLQ pain onset 2 days ago. She states her OB-GYN is Dr. Richardson Dopp. Pt denies n/v, hematuria, dysuria, difficulty urinating, and vaginal bleeding or discharge.   Past Medical History  Diagnosis Date  . Pregnant     hx  . Trichomonas contact, treated   . Hx of gonorrhea   . Keloid     Left Upper Lateral Earlobe  . Arthritis    Past Surgical History  Procedure Laterality Date  . External ear surgery Left 12  . Ear cyst excision Left 04/05/2014    Procedure: EXCISION EAR CYST;  Surgeon: Melvenia Beam, MD;  [facility] OR;  Service: ENT;  Laterality: Left;  Excision of keloid   Family History  Problem Relation Age of Onset  . Other Neg Hx   . Diabetes Mother   . Hypertension Mother    Social History  Substance Use Topics  . Smoking status: Former Smoker -- 0.50 packs/day for 2 years    Types: Cigarettes    Quit date: 03/18/2014  . Smokeless tobacco: Never Used  . Alcohol Use: No   OB History    Gravida Para Term Preterm AB TAB SAB Ectopic Multiple Living   Review of Systems  All other systems reviewed and are negative.   Allergies  Review of patient's allergies indicates no known allergies.  Home Medications   Prior to Admission medications   Medication Sig Start Date End Date Taking? Authorizing Provider  diclofenac (VOLTAREN) 75 MG EC tablet Take 75 mg by mouth daily.    Historical Provider, MD   HYDROcodone-acetaminophen (NORCO) 5-325 MG tablet Take 1-2 tablets by mouth every 6 (six) hours as needed (for pain). 03/25/15   Heydi Swango, MD   BP 116/74 mmHg  Pulse 77  Temp(Src) 98.7 F (37.1 C) (Oral)  Resp 16  Ht  (1.702 m)  Wt 185 lb (83.915 kg)  BMI 28.97 kg/m2  SpO2 100%  LMP 03/04/2015 Physical Exam General: Well-developed, well-nourished female in no acute distress; appearance consistent with age of record HENT: normocephalic; atraumatic Eyes: pupils equal, round and reactive to light; extraocular muscles intact Neck: supple Heart: regular rate and rhythm Lungs: clear to auscultation bilaterally Abdomen: soft; nondistended; Left suprapubic TTP; no masses or hepatosplenomegaly; bowel sounds present GU: No CVA TTP Extremities: No deformity; full range of motion; pulses normal Neurologic: Awake, alert and oriented; motor function intact in all extremities and symmetric; no facial droop Skin: Warm and dry Psychiatric: Normal mood and affect   ED Course  Procedures   MDM   Nursing notes and vitals signs, including pulse oximetry, reviewed.  Summary of this visit's results, reviewed by myself:  Labs:  Results for orders placed or performed during the hospital encounter of 03/24/15 (from the past 24 hour(s))  Urinalysis, Routine w reflex microscopic-may I&O cath if menses (not at [facility])  Status: None   Collection Time: 03/24/15  9:32 PM  Result Value Ref Range   Color, Urine YELLOW YELLOW   APPearance CLEAR CLEAR   Specific Gravity, Urine 1.018 1.005 - 1.030   pH 6.5 5.0 - 8.0   Glucose, UA NEGATIVE NEGATIVE mg/dL   Hgb urine dipstick NEGATIVE NEGATIVE   Bilirubin Urine NEGATIVE NEGATIVE   Ketones, ur NEGATIVE NEGATIVE mg/dL   Protein, ur NEGATIVE NEGATIVE mg/dL   Nitrite NEGATIVE NEGATIVE   Leukocytes, UA NEGATIVE NEGATIVE    Final diagnoses:  Pelvic pain in female   [place] recommended, pt prefers to schedule as an outpatient.  I personally  performed the services described in this documentation, which was scribed in my presence. The recorded information has been reviewed and is accurate.   Paula Libra, MD 03/25/15 [phone]

## 2015-04-08 ENCOUNTER — Other Ambulatory Visit (HOSPITAL_COMMUNITY)
Admission: RE | Admit: 2015-04-08 | Discharge: 2015-04-08 | Disposition: A | Discharge status: Home / Self Care | Payer: Commercial Managed Care - HMO | Source: Ambulatory Visit | Attending: Obstetrics and Gynecology | Admitting: Obstetrics and Gynecology

## 2015-04-08 ENCOUNTER — Other Ambulatory Visit: Payer: Self-pay | Admitting: Obstetrics and Gynecology

## 2015-04-08 DIAGNOSIS — Z01419 Encounter for gynecological examination (general) (routine) without abnormal findings: Secondary | ICD-10-CM | POA: Insufficient documentation

## 2015-04-09 LAB — CYTOLOGY - PAP

## 2015-04-22 ENCOUNTER — Emergency Department (HOSPITAL_COMMUNITY): Payer: Commercial Managed Care - HMO

## 2015-04-22 ENCOUNTER — Encounter (HOSPITAL_COMMUNITY): Payer: Self-pay | Admitting: Emergency Medicine

## 2015-04-22 ENCOUNTER — Emergency Department (HOSPITAL_COMMUNITY)
Admission: EM | Admit: 2015-04-22 | Discharge: 2015-04-23 | Disposition: A | Discharge status: Home / Self Care | Payer: Commercial Managed Care - HMO | Attending: Emergency Medicine | Admitting: Emergency Medicine

## 2015-04-22 DIAGNOSIS — M1990 Unspecified osteoarthritis, unspecified site: Secondary | ICD-10-CM | POA: Insufficient documentation

## 2015-04-22 DIAGNOSIS — R079 Chest pain, unspecified: Secondary | ICD-10-CM | POA: Diagnosis present

## 2015-04-22 DIAGNOSIS — Z872 Personal history of diseases of the skin and subcutaneous tissue: Secondary | ICD-10-CM | POA: Diagnosis not present

## 2015-04-22 DIAGNOSIS — Z87891 Personal history of nicotine dependence: Secondary | ICD-10-CM | POA: Insufficient documentation

## 2015-04-22 DIAGNOSIS — M940 Chondrocostal junction syndrome [Tietze]: Secondary | ICD-10-CM | POA: Insufficient documentation

## 2015-04-22 DIAGNOSIS — Z8619 Personal history of other infectious and parasitic diseases: Secondary | ICD-10-CM | POA: Diagnosis not present

## 2015-04-22 LAB — BASIC METABOLIC PANEL
Anion gap: 11 (ref 5–15)
BUN: 12 mg/dL (ref 6–20)
CALCIUM: 9.5 mg/dL (ref 8.9–10.3)
CO2: 21 mmol/L — ABNORMAL LOW (ref 22–32)
Chloride: 106 mmol/L (ref 101–111)
Creatinine, Ser: 1.01 mg/dL — ABNORMAL HIGH (ref 0.44–1.00)
GFR calc Af Amer: >60 mL/min (ref >60)
GLUCOSE: 99 mg/dL (ref 65–99)
Potassium: 3.9 mmol/L (ref 3.5–5.1)
Sodium: 138 mmol/L (ref 135–145)

## 2015-04-22 LAB — CBC
HCT: 39.6 % (ref 36.0–46.0)
Hemoglobin: 13 g/dL (ref 12.0–15.0)
MCH: 27.8 pg (ref 26.0–34.0)
MCHC: 32.8 g/dL (ref 30.0–36.0)
MCV: 84.8 fL (ref 78.0–100.0)
PLATELETS: 240 10*3/uL (ref 150–400)
RBC: 4.67 MIL/uL (ref 3.87–5.11)
RDW: 13.3 % (ref 11.5–15.5)
WBC: 6.4 10*3/uL (ref 4.0–10.5)

## 2015-04-22 LAB — I-STAT TROPONIN, ED: TROPONIN I, POC: 0 ng/mL (ref 0.00–0.08)

## 2015-04-22 MED ORDER — OXYCODONE-ACETAMINOPHEN 5-325 MG PO TABS
ORAL_TABLET | ORAL | Status: AC
  Filled 2015-04-22: qty 1

## 2015-04-22 MED ORDER — IBUPROFEN 400 MG PO TABS
ORAL_TABLET | ORAL | Status: AC
  Filled 2015-04-22: qty 1

## 2015-04-22 MED ORDER — NAPROXEN 500 MG PO TABS: 500.0000 mg | ORAL_TABLET | Freq: Two times a day (BID) | ORAL | Status: DC

## 2015-04-22 MED ORDER — IBUPROFEN 400 MG PO TABS
400.0000 mg | ORAL_TABLET | Freq: Once | ORAL | Status: AC
  Administered 2015-04-22: 400 mg via ORAL

## 2015-04-22 MED ORDER — OXYCODONE-ACETAMINOPHEN 5-325 MG PO TABS: 1.0000 | ORAL_TABLET | Freq: Once | ORAL | Status: DC

## 2015-04-22 NOTE — ED Provider Notes (Signed)
CSN: [id number]     Arrival date & time 04/22/15  1950 History   First MD Initiated Contact with Patient 04/22/15 2320     Chief Complaint  Patient presents with  . Chest Pain      HPI  25 year old female presents with a history of chest pain. Just to the left of midline in the left upper parasternal chest. Painful to breathe touch or move when it occurs. Given ibuprofen earlier at triage. Unfortunately had an extended wait in the emergency room tonight. Her symptoms have resolved after ibuprofen. No fall or injury or trauma. No exertional pain or symptoms. No history of structural heart disease. Nonsmoker.  Past Medical History  Diagnosis Date  . Pregnant     hx  . Trichomonas contact, treated   . Hx of gonorrhea   . Keloid     Left Upper Lateral Earlobe  . Arthritis    Past Surgical History  Procedure Laterality Date  . External ear surgery Left 12  . Ear cyst excision Left 04/05/2014    Procedure: EXCISION EAR CYST;  Surgeon: Melvenia Beam, MD;  Location: [facility] OR;  Service: ENT;  Laterality: Left;  Excision of keloid   Family History  Problem Relation Age of Onset  . Other Neg Hx   . Diabetes Mother   . Hypertension Mother    Social History  Substance Use Topics  . Smoking status: Former Smoker -- 0.50 packs/day for 2 years    Types: Cigarettes    Quit date: 03/18/2014  . Smokeless tobacco: Never Used  . Alcohol Use: No   OB History    Gravida Para Term Preterm AB TAB SAB Ectopic Multiple Living   Review of Systems  Constitutional: Negative for fever, chills, diaphoresis, appetite change and fatigue.  HENT: Negative for mouth sores, sore throat and trouble swallowing.   Eyes: Negative for visual disturbance.  Respiratory: Negative for cough, chest tightness, shortness of breath and wheezing.   Cardiovascular: Positive for chest pain.  Gastrointestinal: Negative for nausea, vomiting, abdominal pain, diarrhea and abdominal distention.    Endocrine: Negative for polydipsia, polyphagia and polyuria.  Genitourinary: Negative for dysuria, frequency and hematuria.  Musculoskeletal: Negative for gait problem.  Skin: Negative for color change, pallor and rash.  Neurological: Negative for dizziness, syncope, light-headedness and headaches.  Hematological: Does not bruise/bleed easily.  Psychiatric/Behavioral: Negative for behavioral problems and confusion.      Allergies  Hydrocodone  Home Medications   Prior to Admission medications   Medication Sig Start Date End Date Taking? Authorizing Provider  ibuprofen (ADVIL,MOTRIN) 800 MG tablet Take 800 mg by mouth every 8 (eight) hours as needed for mild pain.   Yes Historical Provider, MD  Multiple Vitamin (MULTI VITAMIN PO) Take 1 tablet by mouth daily.   Yes Historical Provider, MD  Multiple Vitamins-Minerals (HAIR/SKIN/NAILS PO) Take 1 tablet by mouth daily.   Yes Historical Provider, MD  naproxen (NAPROSYN) 500 MG tablet Take 1 tablet (500 mg total) by mouth 2 (two) times daily. 04/22/15   Rolland Porter, MD   BP 128/74 mmHg  Pulse 79  Temp(Src) 98 F (36.7 C) (Oral)  Resp 16  Ht  (1.727 m)  Wt 180 lb (81.647 kg)  BMI 27.38 kg/m2  SpO2 99%  LMP 04/04/2015 (Exact Date) Physical Exam  Constitutional: She is oriented to person, place, and time. She appears well-developed and well-nourished. No distress.  HENT:  Head: Normocephalic.  Eyes: Conjunctivae are normal. Pupils are equal, round, and reactive to light. No scleral icterus.  Neck: Normal range of motion. Neck supple. No thyromegaly present.  Cardiovascular: Normal rate and regular rhythm.  Exam reveals no gallop and no friction rub.   No murmur heard. Pulmonary/Chest: Effort normal and breath sounds normal. No respiratory distress. She has no wheezes. She has no rales.    Abdominal: Soft. Bowel sounds are normal. She exhibits no distension. There is no tenderness. There is no rebound.  Musculoskeletal: Normal  range of motion.  Neurological: She is alert and oriented to person, place, and time.  Skin: Skin is warm and dry. No rash noted.  Psychiatric: She has a normal mood and affect. Her behavior is normal.    ED Course  Procedures (including critical care time) Labs Review Labs Reviewed  BASIC METABOLIC PANEL - Abnormal; Notable for the following:    CO2 21 (*)    Creatinine, Ser 1.01 (*)    All other components within normal limits  CBC  I-STAT TROPOININ, ED    Imaging Review Dg Chest 2 View  04/22/2015  CLINICAL DATA:  Initial evaluation for acute chest pain. EXAM: CHEST  2 VIEW COMPARISON:  Prior study from 10/07/2009. FINDINGS: The cardiac and mediastinal silhouettes are stable in size and contour, and remain within normal limits. The lungs are normally inflated. Minimal left basilar atelectasis. No airspace consolidation, pleural effusion, or pulmonary edema is identified. There is no pneumothorax. No acute osseous abnormality identified. IMPRESSION: Minimal left basilar subsegmental atelectasis. No other active cardiopulmonary disease. Electronically Signed   By: Rise Mu M.D.   On: 04/22/2015 21:05   I have personally reviewed and evaluated these images and lab results as part of my medical decision-making.   EKG Interpretation   Date/Time:  Tuesday April 22 2015 20:23:12 EST Ventricular Rate:  85 PR Interval:  156 QRS Duration: 68 QT Interval:  332 QTC Calculation: 395 R Axis:   72 Text Interpretation:  Normal sinus rhythm Normal ECG Confirmed by Fayrene Fearing   MD, Prince Olivier ([id number]) on 04/22/2015 11:26:16 PM      MDM   Final diagnoses:  Costochondritis    Normal EKG, normal chest x-ray, normal labs. Reproducible originally, resolved after ibuprofen. Consistent with costochondritis. No high risk history.    Rolland Porter, MD 04/22/15 [phone]

## 2015-04-22 NOTE — Discharge Instructions (Signed)
Costochondritis  Costochondritis is a condition in which the tissue (cartilage) that connects your ribs with your breastbone (sternum) becomes irritated. It causes pain in the chest and rib area. It usually goes away on its own over time.  HOME CARE  · Avoid activities that wear you out.  · Do not strain your ribs. Avoid activities that use your:    Chest.    Belly.    Side muscles.  · Put ice on the area for the first 2 days after the pain starts.    Put ice in a plastic bag.    Place a towel between your skin and the bag.    Leave the ice on for 20 minutes, 2-3 times a day.  · Only take medicine as told by your doctor.  GET HELP IF:  · You have redness or puffiness (swelling) in the rib area.  · Your pain does not go away with rest or medicine.  GET HELP RIGHT AWAY IF:   · Your pain gets worse.  · You are very uncomfortable.  · You have trouble breathing.  · You cough up blood.  · You start sweating or throwing up (vomiting).  · You have a fever or lasting symptoms for more than 2-3 days.  · You have a fever and your symptoms suddenly get worse.  MAKE SURE YOU:   · Understand these instructions.  · Will watch your condition.  · Will get help right away if you are not doing well or get worse.     This information is not intended to replace advice given to you by your health care provider. Make sure you discuss any questions you have with your health care provider.     Document Released: 08/04/2007 Document Revised: 10/18/2012 Document Reviewed: 09/19/2012  [employer]vier Interactive Patient Education ©2016 [employer]

## 2015-04-22 NOTE — ED Notes (Signed)
Patient here with complaint of chest pain. States onset over 1 year ago, but attributed it to smoking. Recently stopped smoking, but pain has persisted. Today pain was worsened so patient elected to present for evaluation. Also endorses SOB. Palpation reproduces pain.

## 2016-06-07 IMAGING — US US TRANSVAGINAL NON-OB
1 series · 14 of 25 positions shown · non-contrast
Comparison: None

CLINICAL DATA: 24-year-old female with bilateral pelvic pain for 1
day.

EXAM:
TRANSABDOMINAL AND TRANSVAGINAL ULTRASOUND OF PELVIS
TECHNIQUE: Both transabdominal and transvaginal ultrasound examinations of the
pelvis were performed. Transabdominal technique was performed for
global imaging of the pelvis including uterus, ovaries, adnexal
regions, and pelvic cul-de-sac. It was necessary to proceed with
endovaginal exam following the transabdominal exam to visualize the
ovaries and endometrium.

[Series 1: us transvaginal non-ob · 0.22mm/px · 14 of 78 slices shown]
[im 1/78]
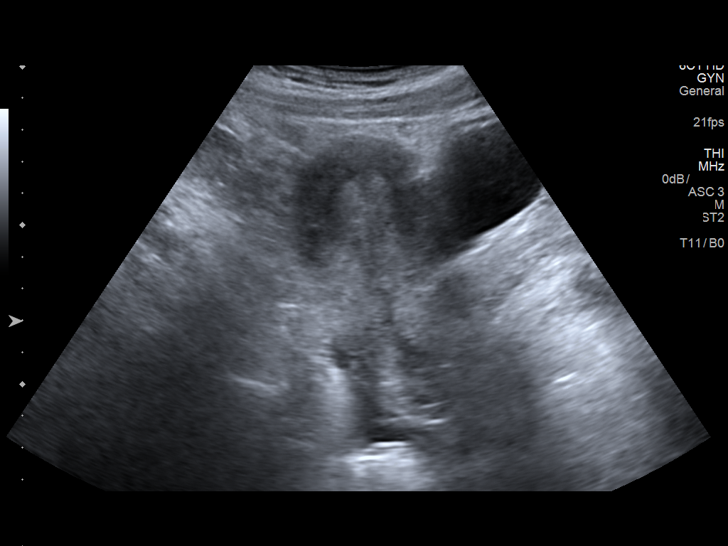
[im 7/78]
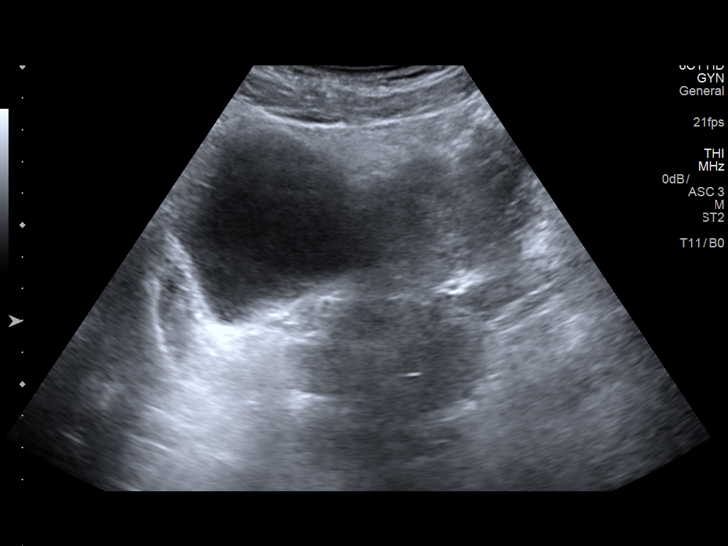
[im 13/78]
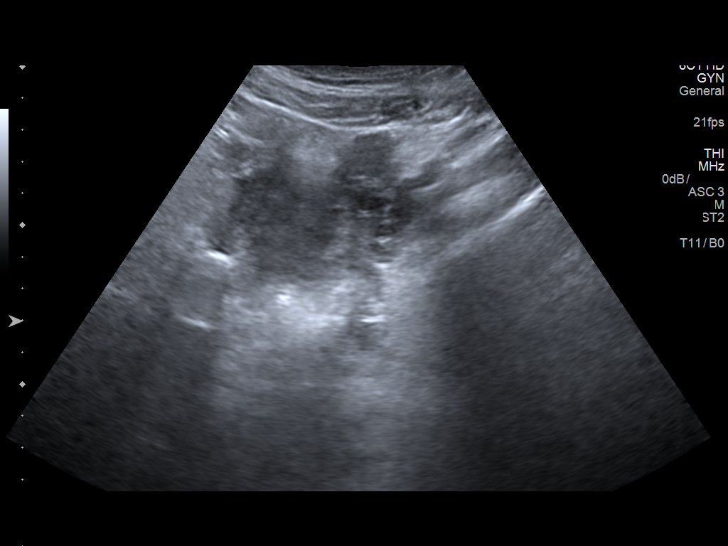
[im 20/78]
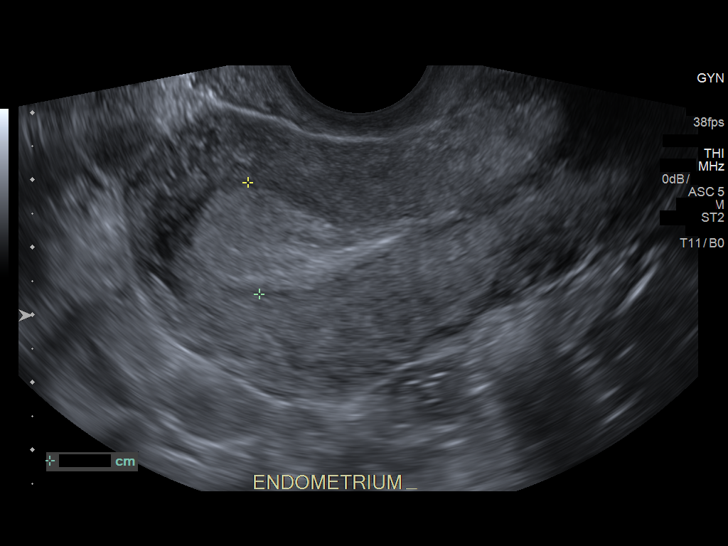
[im 26/78]
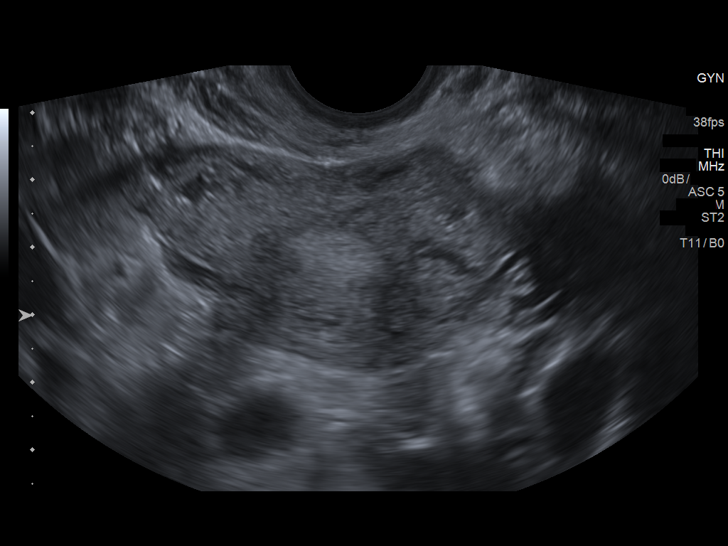
[im 29/78]
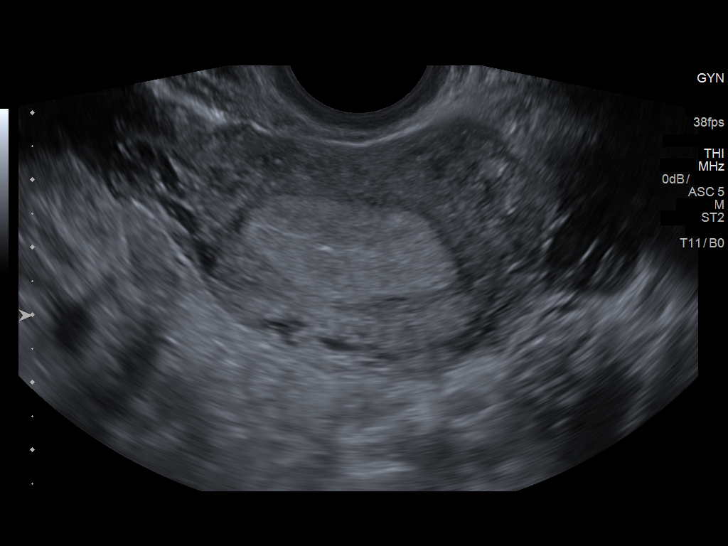
[im 36/78]
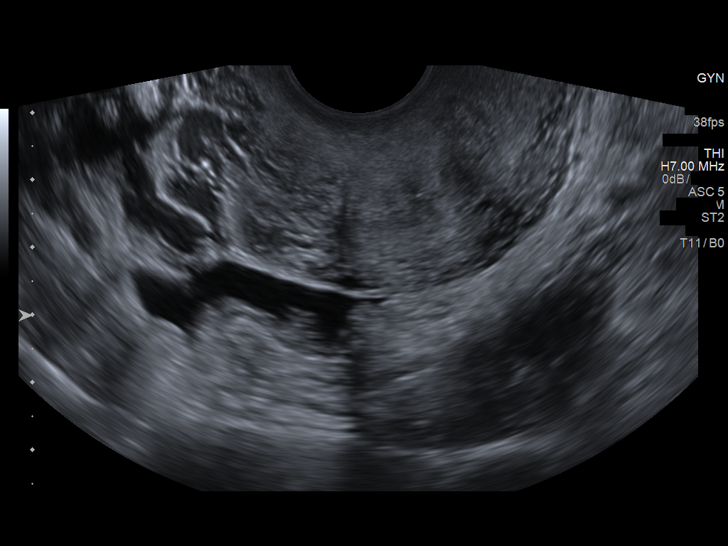
[im 42/78]
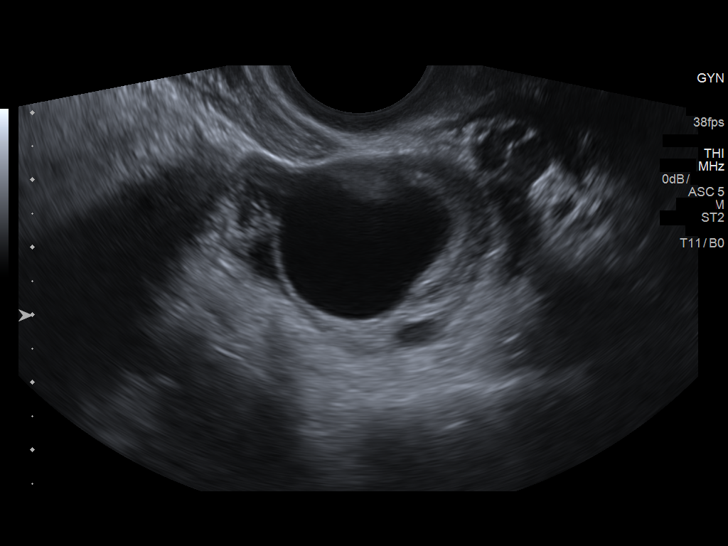
[im 49/78]
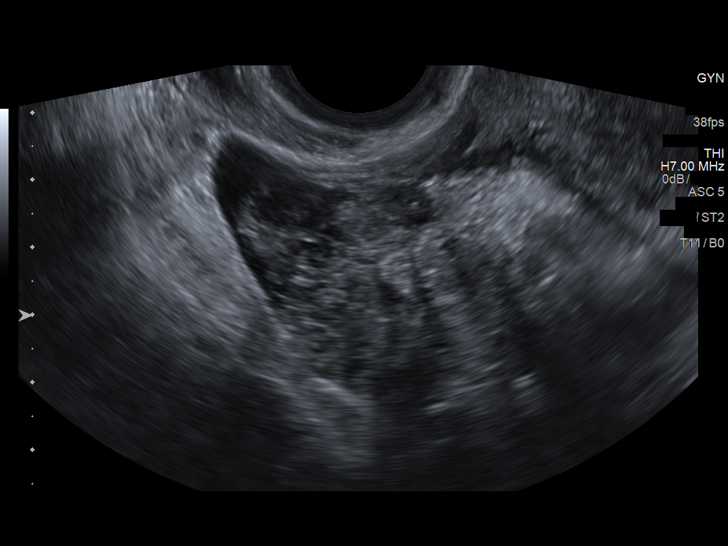
[im 52/78]
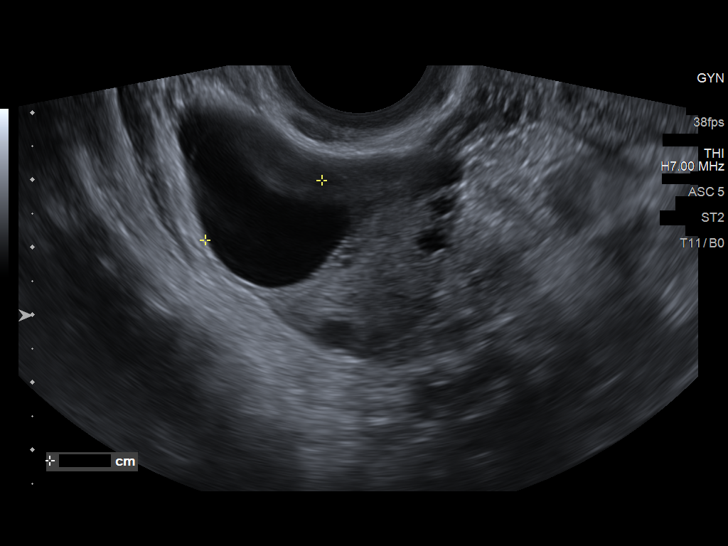
[im 58/78]
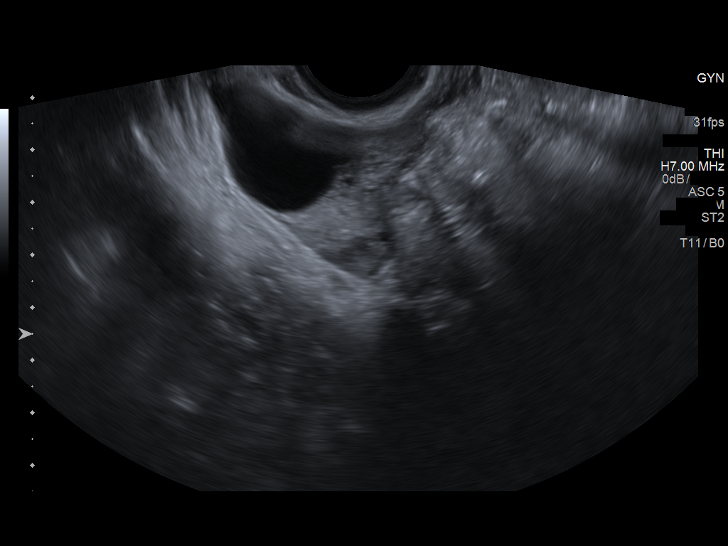
[im 65/78]
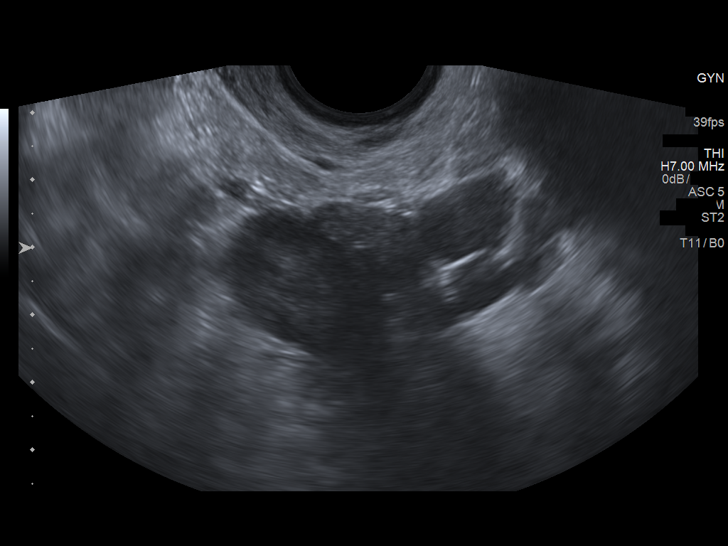
[im 71/78]
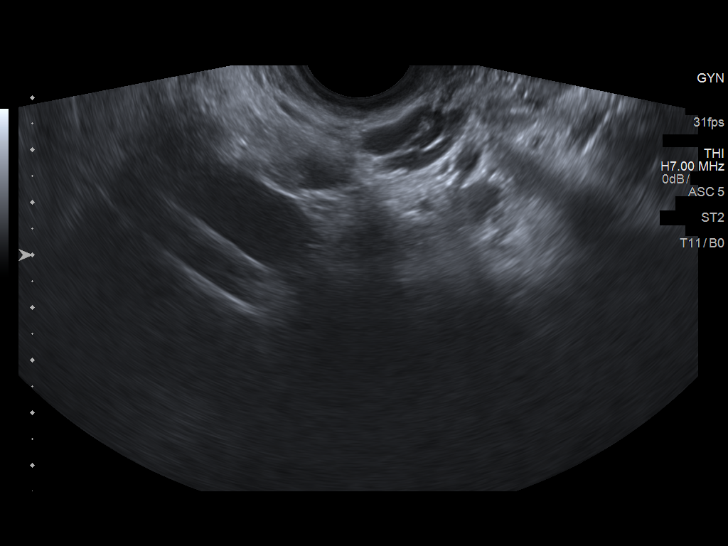
[im 78/78]
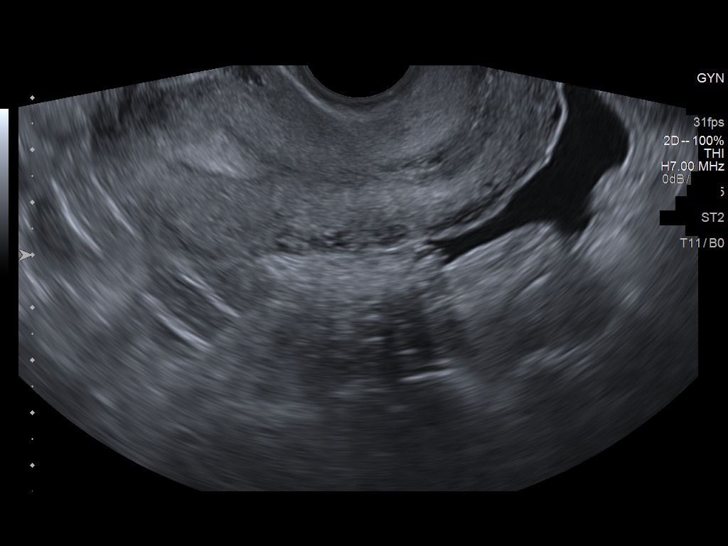

[14 of 25 positions shown; findings below may reference images not displayed]

FINDINGS: Uterus

Measurements: 9.3 x 4.5 x 5.6 cm. No fibroids or other mass
visualized.

Endometrium

Thickness: 16 mm.  No focal abnormality visualized.

Right ovary

Measurements: 3.5 x 3.3 x 3.5 cm.. A 2.5 x 2.4 x 1.9 cm
follicle/simple cyst is identified. No other abnormalities noted.

Left ovary

Measurements: 3.2 x 1.8 x 1.8 cm. Normal appearance/no adnexal mass.

Other findings

A trace amount of free pelvic fluid may be physiologic.
IMPRESSION: 2.5 cm right ovarian follicle/simple cyst with trace amount of free
pelvic fluid which may be physiologic.

No other significant abnormalities.

## 2018-01-20 ENCOUNTER — Encounter (HOSPITAL_COMMUNITY): Payer: Self-pay

## 2018-01-20 ENCOUNTER — Emergency Department (HOSPITAL_COMMUNITY)
Admission: EM | Admit: 2018-01-20 | Discharge: 2018-01-20 | Disposition: A | Discharge status: Home / Self Care | Payer: 59 | Attending: Emergency Medicine | Admitting: Emergency Medicine

## 2018-01-20 DIAGNOSIS — Z87891 Personal history of nicotine dependence: Secondary | ICD-10-CM | POA: Insufficient documentation

## 2018-01-20 DIAGNOSIS — Z79899 Other long term (current) drug therapy: Secondary | ICD-10-CM | POA: Insufficient documentation

## 2018-01-20 DIAGNOSIS — R0789 Other chest pain: Secondary | ICD-10-CM | POA: Insufficient documentation

## 2018-01-20 MED ORDER — NAPROXEN 250 MG PO TABS
500.0000 mg | ORAL_TABLET | Freq: Once | ORAL | Status: AC
  Administered 2018-01-20: 500 mg via ORAL
  Filled 2018-01-20: qty 2

## 2018-01-20 MED ORDER — NAPROXEN 500 MG PO TABS: 500.0000 mg | ORAL_TABLET | Freq: Two times a day (BID) | ORAL | 0 refills | Status: DC

## 2018-01-20 NOTE — ED Provider Notes (Signed)
TIME SEEN: 5:17 AM  CHIEF COMPLAINT: Chest pain  HPI: Patient is a 27 year old female who presents to the emergency department with anterior chest wall pain that she describes as sharp in nature that has been intermittent for 2 years.  Was seen here in 2017 for the same pain and was diagnosed with costochondritis.  Reports she was given a prescription for naproxen which does help with her pain.  Has also tried Tylenol and ibuprofen without much relief.  No shortness of breath, nausea, vomiting, diaphoresis, dizziness, fever, cough.  No history of PE, DVT, exogenous estrogen use, recent fractures, surgery, trauma, hospitalization or prolonged travel. No lower extremity swelling or pain. No calf tenderness.  Pain is worse with movement, palpation, coughing and sneezing.  Not exertional or pleuritic.   ROS: See HPI Constitutional: no fever  Eyes: no drainage  ENT: no runny nose   Cardiovascular:   chest pain  Resp: no SOB  GI: no vomiting GU: no dysuria Integumentary: no rash  Allergy: no hives  Musculoskeletal: no leg swelling  Neurological: no slurred speech ROS otherwise negative  PAST MEDICAL HISTORY/PAST SURGICAL HISTORY:  Past Medical History:  Diagnosis Date  . Arthritis   . Hx of gonorrhea   . Keloid    Left Upper Lateral Earlobe  . Pregnant    hx  . Trichomonas contact, treated     MEDICATIONS:  Prior to Admission medications   Medication Sig Start Date End Date Taking? Authorizing Provider  ibuprofen (ADVIL,MOTRIN) 800 MG tablet Take 800 mg by mouth every 8 (eight) hours as needed for mild pain.    [provider]  Multiple Vitamin (MULTI VITAMIN PO) Take 1 tablet by mouth daily.    [provider]  Multiple Vitamins-Minerals (HAIR/SKIN/NAILS PO) Take 1 tablet by mouth daily.    [provider]  naproxen (NAPROSYN) 500 MG tablet Take 1 tablet (500 mg total) by mouth 2 (two) times daily. 04/22/15   Rolland PorterJames, Mark, MD    ALLERGIES:  Allergies   Allergen Reactions  . Hydrocodone Itching    SOCIAL HISTORY:  Social History   Tobacco Use  . Smoking status: Former Smoker    Packs/day: 0.50    Years: 2.00    Pack years: 1.00    Types: Cigarettes    Last attempt to quit: 03/18/2014    Years since quitting: 3.8  . Smokeless tobacco: Never Used  Substance Use Topics  . Alcohol use: No    FAMILY HISTORY: Family History  Problem Relation Age of Onset  . Diabetes Mother   . Hypertension Mother   . Other Neg Hx     EXAM: BP 109/68 (BP Location: Right Arm)   Pulse 77   Temp 97.9 F (36.6 C) (Oral)   Resp 20   SpO2 100%  CONSTITUTIONAL: Alert and oriented and responds appropriately to questions. Well-appearing; well-nourished HEAD: Normocephalic EYES: Conjunctivae clear, pupils appear equal, EOMI ENT: normal nose; moist mucous membranes NECK: Supple, no meningismus, no nuchal rigidity, no LAD  CARD: RRR; S1 and S2 appreciated; no murmurs, no clicks, no rubs, no gallops CHEST:  Chest wall is tender to palpation over the central anterior chest which reproduces her pain.  No crepitus, ecchymosis, erythema, warmth, rash or other lesions present.   RESP: Normal chest excursion without splinting or tachypnea; breath sounds clear and equal bilaterally; no wheezes, no rhonchi, no rales, no hypoxia or respiratory distress, speaking full sentences ABD/GI: Normal bowel sounds; non-distended; soft, non-tender, no rebound, no  guarding, no peritoneal signs, no hepatosplenomegaly BACK:  The back appears normal and is non-tender to palpation, there is no CVA tenderness EXT: Normal ROM in all joints; non-tender to palpation; no edema; normal capillary refill; no cyanosis, no calf tenderness or swelling    SKIN: Normal color for age and race; warm; no rash NEURO: Moves all extremities equally PSYCH: The patient's mood and manner are appropriate. Grooming and personal hygiene are appropriate.  MEDICAL DECISION MAKING: Patient here with  chest wall pain.  Has been diagnosed with costochondritis in the past and I suspect that this is again musculoskeletal chest pain today.  Symptoms have been present intermittently for 2 years.  Patient is unable to tell me what prompted her to come to the emergency department today for this.  Doubt ACS or dissection.  She is PERC negative.  She states she has been given a prescription for naproxen before which helped with her pain.  Will give naproxen again today and have discussed with her that this medication can be found over-the-counter as well.  EKG shows   At this time, I do not feel there is any life-threatening condition present. I have reviewed and discussed all results (EKG, imaging, lab, urine as appropriate) and exam findings with patient/family. I have reviewed nursing notes and appropriate previous records.  I feel the patient is safe to be discharged home without further emergent workup and can continue workup as an outpatient as needed. Discussed usual and customary return precautions. Patient/family verbalize understanding and are comfortable with this plan.  Outpatient follow-up has been provided if needed. All questions have been answered.      EKG Interpretation  Date/Time:  Friday January 20 2018 05:18:24 EST Ventricular Rate:  83 PR Interval:    QRS Duration: 80 QT Interval:  355 QTC Calculation: 418 R Axis:   79 Text Interpretation:  Sinus rhythm No significant change since last tracing Confirmed by Ward, Baxter Hire [phone]) on 01/20/2018 5:23:22 AM         Ward, Layla Maw, DO 01/20/18 [id number]

## 2018-01-20 NOTE — Discharge Instructions (Signed)
To find a primary care or specialty doctor please call [phone] or [phone] to access "[facility] Find a Doctor Service." ° °You may also go on the [facility] website at [email or web address] ° °There are also multiple [facility], [facility], [facility] and [facility] practices throughout the [place] that are frequently accepting new patients. You may find a clinic that is close to your home and contact them. ° °[facility] -  °[address] °[place] [address] °[phone] ° ° °[facility] -  °[address] °[place] [address] °[phone] ° ° °[facility] - °[address]  °[place] [address] °[phone] ° ° °

## 2018-01-20 NOTE — ED Triage Notes (Signed)
Pt arrive via POV for chronic intermit CP that did not resolve from PO  Rx Tylenol

## 2018-05-03 ENCOUNTER — Encounter: Payer: Self-pay | Admitting: Medical

## 2018-05-03 ENCOUNTER — Ambulatory Visit (INDEPENDENT_AMBULATORY_CARE_PROVIDER_SITE_OTHER): Payer: BLUE CROSS/BLUE SHIELD | Admitting: Medical

## 2018-05-03 VITALS — BP 109/68 | HR 89 | Resp 14 | Ht 68.0 in | Wt 190.0 lb

## 2018-05-03 DIAGNOSIS — M5432 Sciatica, left side: Secondary | ICD-10-CM

## 2018-05-03 DIAGNOSIS — M549 Dorsalgia, unspecified: Secondary | ICD-10-CM

## 2018-05-03 LAB — POC URINALSYSI DIPSTICK (AUTOMATED)
Bilirubin, UA: NEGATIVE
Blood, UA: NEGATIVE
Glucose, UA: NEGATIVE
KETONES UA: NEGATIVE
LEUKOCYTES UA: NEGATIVE
Nitrite, UA: NEGATIVE
PROTEIN UA: POSITIVE — AB
Spec Grav, UA: 1.02 (ref 1.010–1.025)
Urobilinogen, UA: 0.2 E.U./dL
pH, UA: 6.5 (ref 5.0–8.0)

## 2018-05-03 MED ORDER — CYCLOBENZAPRINE HCL 10 MG PO TABS: 10.0000 mg | ORAL_TABLET | Freq: Every day | ORAL | 0 refills | Status: DC

## 2018-05-03 NOTE — Addendum Note (Signed)
Addended by: Mervin Kung A on: 05/03/2018 03:11 PM   Modules accepted: Orders

## 2018-05-03 NOTE — Progress Notes (Signed)
Subjective:    Patient ID: Karen Mcmahon, female    DOB: August 02, 1990, 28 y.o.   MRN: [id number]  HPI  Pt in for first time. Pt mom pt of the office.  Pt works at [employer]. Pt does not exercise regularly. Pt does not drink caffeine beverages. Pt drinks mixed drink about once a day. Pt smokes marijuana(4 joints a day). Admits does not eat healthy. Single. 37 boy 28 years old.  Pt in with some with left lower back area pain. She points to are above hip/upper buttox. Pt stats pain hurts when she moves. She notes lumbar in her back. She does not remember what part of spine. Pt has tried tylenol for pain. It helps a little. Pt states pain for a couple of months.   No pain that radiates to her buttox or back of leg. Pt mom and sister have kidney disease related to diabetes.   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  LMP- last month about 3 weeks ago. She states not late.   Review of Systems  Constitutional: Negative for chills, fatigue and fever.  Respiratory: Negative for cough, chest tightness, shortness of breath and wheezing.   Cardiovascular: Negative for chest pain and palpitations.  Gastrointestinal: Negative for abdominal pain.  Endocrine: Positive for polydipsia and polyphagia. Negative for polyuria.  Musculoskeletal: Positive for back pain.  Hematological: Negative for adenopathy. Does not bruise/bleed easily.  Psychiatric/Behavioral: Negative for behavioral problems and confusion.    Past Medical History:  Diagnosis Date  . Arthritis   . Hx of gonorrhea   . Keloid    Left Upper Lateral Earlobe  . Pregnant    hx  . Trichomonas contact, treated      Social History   Socioeconomic History  . Marital status: Single    Spouse name: Not on file  . Number of children: Not on file  . Years of education: Not on file  . Highest education level: Not on file  Occupational History  . Occupation: Programmer, multimedia: [employer]  Social  Needs  . Financial resource strain: Not on file  . Food insecurity:    Worry: Not on file    Inability: Not on file  . Transportation needs:    Medical: Not on file    Non-medical: Not on file  Tobacco Use  . Smoking status: Former Smoker    Packs/day: 0.50    Years: 2.00    Pack years: 1.00    Types: Cigarettes    Last attempt to quit: 03/18/2014    Years since quitting: 4.1  . Smokeless tobacco: Never Used  Substance and Sexual Activity  . Alcohol use: Yes    Comment: 1 mixed drink daily.  . Drug use: Yes    Types: Marijuana    Comment: 1 monthlast  . Sexual activity: Yes  Lifestyle  . Physical activity:    Days per week: Not on file    Minutes per session: Not on file  . Stress: Not on file  Relationships  . Social connections:    Talks on phone: Not on file    Gets together: Not on file    Attends religious service: Not on file    Active member of club or organization: Not on file    Attends meetings of clubs or organizations: Not on file    Relationship status: Not on file  . Intimate partner violence:    Fear of current or  ex partner: Not on file    Emotionally abused: Not on file    Physically abused: Not on file    Forced sexual activity: Not on file  Other Topics Concern  . Not on file  Social History Narrative  . Not on file    Past Surgical History:  Procedure Laterality Date  . EAR CYST EXCISION Left 04/05/2014   Procedure: EXCISION EAR CYST;  Surgeon: Melvenia Beam, MD;  Location: [facility];  Service: ENT;  Laterality: Left;  Excision of keloid  . EXTERNAL EAR SURGERY Left 12    Family History  Problem Relation Age of Onset  . Diabetes Mother   . Hypertension Mother   . Other Neg Hx     Allergies  Allergen Reactions  . Hydrocodone Itching    Current Outpatient Medications on File Prior to Visit  Medication Sig Dispense Refill  . Multiple Vitamin (MULTI VITAMIN PO) Take 1 tablet by mouth daily.    . Multiple Vitamins-Minerals (HAIR/SKIN/NAILS  PO) Take 1 tablet by mouth daily.    . naproxen (NAPROSYN) 500 MG tablet Take 1 tablet (500 mg total) by mouth 2 (two) times daily. 30 tablet 0   No current facility-administered medications on file prior to visit.     BP 109/68   Pulse 89   Resp 14   Ht 5\' 8"  (1.727 m)   Wt 190 lb (86.2 kg)   LMP 04/04/2018   SpO2 98%   BMI 28.89 kg/m       Objective:   Physical Exam  General Appearance- Not in acute distress.    Chest and Lung Exam Auscultation: Breath sounds:-Normal. Clear even and unlabored. Adventitious sounds:- No Adventitious sounds.  Cardiovascular Auscultation:Rythm - Regular, rate and rythm. Heart Sounds -Normal heart sounds.  Abdomen Inspection:-Inspection Normal.  Palpation/Perucssion: Palpation and Percussion of the abdomen reveal- Non Tender, No Rebound tenderness, No rigidity(Guarding) and No Palpable abdominal masses.  Liver:-Normal.  Spleen:- Normal.   Back No Mid lumbar spine tenderness to palpation. Left si area tenderness. No pain on straight leg lift. Pain on lateral movements and flexion/extension of the spine. Faint left cva tenderness.  Lower ext neurologic  L5-S1 sensation intact bilaterally. Normal patellar reflexes bilaterally. No foot drop bilaterally.      Assessment & Plan:  You do have pain in the region of the left sciatic area.  There are no pain radiating to your buttocks or leg.  Would recommend Tylenol and low-dose muscle relaxant tonight.  You do have history of mom and sister with kidney disease.  We will get metabolic panel today to evaluate your kidney function as you do have a little bit of faint tenderness in the left kidney region.  With the blood work will assess kidney function.  Your urine looked completely clear and you do not have any UTI symptoms so decided not to get a urine culture.    We will follow your lab results and prescribe short course of anti-inflammatory provided that your kidney function looks  normal.  If kidney function is a little decreased then may need to right low dose taper prednisone.  Follow-up in 7 to 10 days or as needed.  Esperanza Richters, PA-C

## 2018-05-03 NOTE — Patient Instructions (Signed)
You do have pain in the region of the left sciatic area.  There are no pain radiating to your buttocks or leg.  Would recommend Tylenol and low-dose muscle relaxant tonight.  You do have history of mom and sister with kidney disease.  We will get metabolic panel today to evaluate your kidney function as you do have a little bit of faint tenderness in the left kidney region.  With the blood work will assess kidney function.  Your urine looked completely clear and you do not have any UTI symptoms so decided not to get a urine culture.    We will follow your lab results and prescribe short course of anti-inflammatory provided that your kidney function looks normal.  If kidney function is a little decreased then may need to right low dose taper prednisone.  Follow-up in 7 to 10 days or as needed.

## 2018-05-04 ENCOUNTER — Other Ambulatory Visit (INDEPENDENT_AMBULATORY_CARE_PROVIDER_SITE_OTHER): Payer: BLUE CROSS/BLUE SHIELD

## 2018-05-04 DIAGNOSIS — M549 Dorsalgia, unspecified: Secondary | ICD-10-CM

## 2018-05-05 LAB — COMPREHENSIVE METABOLIC PANEL
ALT: 11 U/L (ref 0–35)
AST: 14 U/L (ref 0–37)
Albumin: 4.3 g/dL (ref 3.5–5.2)
Alkaline Phosphatase: 53 U/L (ref 39–117)
BUN: 17 mg/dL (ref 6–23)
CO2: 26 mEq/L (ref 19–32)
Calcium: 9.3 mg/dL (ref 8.4–10.5)
Chloride: 106 mEq/L (ref 96–112)
Creatinine, Ser: 0.98 mg/dL (ref 0.40–1.20)
GFR: 81.71 mL/min (ref >60.00)
Glucose, Bld: 71 mg/dL (ref 70–99)
Potassium: 4.9 mEq/L (ref 3.5–5.1)
Sodium: 138 mEq/L (ref 135–145)
Total Bilirubin: 0.5 mg/dL (ref 0.2–1.2)
Total Protein: 6.7 g/dL (ref 6.0–8.3)

## 2018-05-11 ENCOUNTER — Encounter: Payer: BLUE CROSS/BLUE SHIELD | Admitting: Family Medicine

## 2018-05-18 ENCOUNTER — Other Ambulatory Visit: Payer: Self-pay

## 2018-05-18 ENCOUNTER — Encounter: Payer: Self-pay | Admitting: Family Medicine

## 2018-05-18 ENCOUNTER — Ambulatory Visit (INDEPENDENT_AMBULATORY_CARE_PROVIDER_SITE_OTHER): Payer: BLUE CROSS/BLUE SHIELD | Admitting: Family Medicine

## 2018-05-18 VITALS — BP 108/69 | HR 82 | Ht 68.0 in | Wt 188.1 lb

## 2018-05-18 DIAGNOSIS — Z01419 Encounter for gynecological examination (general) (routine) without abnormal findings: Secondary | ICD-10-CM

## 2018-05-18 DIAGNOSIS — Z113 Encounter for screening for infections with a predominantly sexual mode of transmission: Secondary | ICD-10-CM | POA: Diagnosis not present

## 2018-05-18 DIAGNOSIS — B9689 Other specified bacterial agents as the cause of diseases classified elsewhere: Secondary | ICD-10-CM

## 2018-05-18 DIAGNOSIS — N760 Acute vaginitis: Secondary | ICD-10-CM

## 2018-05-18 DIAGNOSIS — N898 Other specified noninflammatory disorders of vagina: Secondary | ICD-10-CM | POA: Diagnosis not present

## 2018-05-18 DIAGNOSIS — Z124 Encounter for screening for malignant neoplasm of cervix: Secondary | ICD-10-CM

## 2018-05-18 NOTE — Progress Notes (Signed)
GYNECOLOGY ANNUAL PREVENTATIVE CARE ENCOUNTER NOTE  Subjective:   Karen Mcmahon is a 28 y.o. G27P1021 female here for a routine annual gynecologic exam.  Current complaints: vaginal discharge.   Denies abnormal vaginal bleeding, discharge, pelvic pain, problems with intercourse or other gynecologic concerns.    Gynecologic History Patient's last menstrual period was 05/09/2018.  Last Pap: 2017. Results were: normal Last mammogram: n/a  Obstetric History OB History  Gravida Para Term Preterm AB Living  3 1 1   2 1   SAB TAB Ectopic Multiple Live Births  1 1     1     # Outcome Date GA Lbr Len/2nd Weight Sex Delivery Anes PTL Lv  3 Term 03/02/12 [redacted]w[redacted]d 32:38 / 00:33 8 lb 13.6 oz (4.015 kg) M Vag-Spont EPI  LIV     Birth Comments: caput  2 SAB 2011          1 TAB 2010 [redacted]w[redacted]d           Past Medical History:  Diagnosis Date  . Arthritis   . Hx of gonorrhea   . Keloid    Left Upper Lateral Earlobe  . Pregnant    hx  . Trichomonas contact, treated     Past Surgical History:  Procedure Laterality Date  . EAR CYST EXCISION Left 04/05/2014   Procedure: EXCISION EAR CYST;  Surgeon: Melvenia Beam, MD;  Location: [facility];  Service: ENT;  Laterality: Left;  Excision of keloid  . EXTERNAL EAR SURGERY Left 12    Current Outpatient Medications on File Prior to Visit  Medication Sig Dispense Refill  . naproxen (NAPROSYN) 500 MG tablet Take 1 tablet (500 mg total) by mouth 2 (two) times daily. 30 tablet 0  . cyclobenzaprine (FLEXERIL) 10 MG tablet Take 1 tablet (10 mg total) by mouth at bedtime. (Patient not taking: Reported on 05/18/2018) 5 tablet 0  . Multiple Vitamin (MULTI VITAMIN PO) Take 1 tablet by mouth daily.    . Multiple Vitamins-Minerals (HAIR/SKIN/NAILS PO) Take 1 tablet by mouth daily.     No current facility-administered medications on file prior to visit.     Allergies  Allergen Reactions  . Hydrocodone Itching    Social History   Socioeconomic History  . Marital  status: Single    Spouse name: Not on file  . Number of children: Not on file  . Years of education: Not on file  . Highest education level: Not on file  Occupational History  . Occupation: Programmer, multimedia: [employer]  Social Needs  . Financial resource strain: Not on file  . Food insecurity:    Worry: Not on file    Inability: Not on file  . Transportation needs:    Medical: Not on file    Non-medical: Not on file  Tobacco Use  . Smoking status: Former Smoker    Packs/day: 0.50    Years: 2.00    Pack years: 1.00    Types: Cigarettes    Last attempt to quit: 03/18/2014    Years since quitting: 4.1  . Smokeless tobacco: Never Used  Substance and Sexual Activity  . Alcohol use: Yes    Comment: 1 mixed drink daily.  . Drug use: Yes    Types: Marijuana    Comment: 1 monthlast  . Sexual activity: Yes    Birth control/protection: None  Lifestyle  . Physical activity:    Days per week: Not on file    Minutes  per session: Not on file  . Stress: Not on file  Relationships  . Social connections:    Talks on phone: Not on file    Gets together: Not on file    Attends religious service: Not on file    Active member of club or organization: Not on file    Attends meetings of clubs or organizations: Not on file    Relationship status: Not on file  . Intimate partner violence:    Fear of current or ex partner: Not on file    Emotionally abused: Not on file    Physically abused: Not on file    Forced sexual activity: Not on file  Other Topics Concern  . Not on file  Social History Narrative  . Not on file    Family History  Problem Relation Age of Onset  . Diabetes Mother   . Hypertension Mother   . Other Neg Hx     The following portions of the patient's history were reviewed and updated as appropriate: allergies, current medications, past family history, past medical history, past social history, past surgical history and problem list.  Review of Systems  Pertinent items are noted in HPI.   Objective:  BP 108/69   Pulse 82   Ht 5\' 8"  (1.727 m)   Wt 188 lb 1.9 oz (85.3 kg)   LMP 05/09/2018   BMI 28.60 kg/m  Wt Readings from Last 3 Encounters:  05/18/18 188 lb 1.9 oz (85.3 kg)  05/03/18 190 lb (86.2 kg)  04/22/15 180 lb (81.6 kg)     CONSTITUTIONAL: Well-developed, well-nourished female in no acute distress.  HENT:  Normocephalic, atraumatic, External right and left ear normal. Oropharynx is clear and moist EYES: Conjunctivae and EOM are normal. Pupils are equal, round, and reactive to light. No scleral icterus.  NECK: Normal range of motion, supple, no masses.  Normal thyroid.   CARDIOVASCULAR: Normal heart rate noted, regular rhythm RESPIRATORY: Clear to auscultation bilaterally. Effort and breath sounds normal, no problems with respiration noted. BREASTS: Symmetric in size. No masses, skin changes, nipple drainage, or lymphadenopathy. ABDOMEN: Soft, normal bowel sounds, no distention noted.  No tenderness, rebound or guarding.  PELVIC: Normal appearing external genitalia; normal appearing vaginal mucosa and cervix.  No abnormal discharge noted.  Normal uterine size, no other palpable masses, no uterine or adnexal tenderness. MUSCULOSKELETAL: Normal range of motion. No tenderness.  No cyanosis, clubbing, or edema.  2+ distal pulses. SKIN: Skin is warm and dry. No rash noted. Not diaphoretic. No erythema. No pallor. NEUROLOGIC: Alert and oriented to person, place, and time. Normal reflexes, muscle tone coordination. No cranial nerve deficit noted. PSYCHIATRIC: Normal mood and affect. Normal behavior. Normal judgment and thought content.  Assessment:  Annual gynecologic examination with pap smear   Plan:  1. Well Woman Exam Will follow up results of pap smear and manage accordingly. STD testing discussed. Patient requested vaginal testing  Routine preventative health maintenance measures emphasized. Please refer to After Visit  Summary for other counseling recommendations.    Candelaria Celeste, DO [facility]

## 2018-05-22 LAB — CYTOLOGY - PAP
Adequacy: ABSENT
Diagnosis: NEGATIVE

## 2018-05-23 ENCOUNTER — Telehealth: Payer: Self-pay

## 2018-05-23 ENCOUNTER — Other Ambulatory Visit: Payer: Self-pay | Admitting: Family Medicine

## 2018-05-23 LAB — CERVICOVAGINAL ANCILLARY ONLY
Bacterial vaginitis: POSITIVE — AB
Candida vaginitis: NEGATIVE
Chlamydia: NEGATIVE
Neisseria Gonorrhea: NEGATIVE

## 2018-05-23 MED ORDER — METRONIDAZOLE 500 MG PO TABS: 500.0000 mg | ORAL_TABLET | Freq: Two times a day (BID) | ORAL | 0 refills | Status: DC

## 2018-05-23 NOTE — Telephone Encounter (Signed)
-----   Message from Levie Heritage, DO sent at 05/23/2018 11:38 AM EDT ----- Patient has BV. Flagyl prescribed. Please let pt know.

## 2018-05-23 NOTE — Telephone Encounter (Signed)
Called pt with results. Pt made aware of positive BV results. Understanding was voiced.  Karen Mcmahon l Elray Dains, CMA

## 2018-05-24 ENCOUNTER — Encounter: Payer: Self-pay | Admitting: Medical

## 2018-05-24 ENCOUNTER — Ambulatory Visit (INDEPENDENT_AMBULATORY_CARE_PROVIDER_SITE_OTHER): Payer: BLUE CROSS/BLUE SHIELD | Admitting: Medical

## 2018-05-24 ENCOUNTER — Telehealth (INDEPENDENT_AMBULATORY_CARE_PROVIDER_SITE_OTHER): Payer: BLUE CROSS/BLUE SHIELD | Admitting: Medical

## 2018-05-24 ENCOUNTER — Telehealth: Payer: Self-pay | Admitting: Medical

## 2018-05-24 ENCOUNTER — Other Ambulatory Visit: Payer: Self-pay

## 2018-05-24 ENCOUNTER — Other Ambulatory Visit: Payer: BLUE CROSS/BLUE SHIELD

## 2018-05-24 VITALS — BP 136/93 | Ht 66.0 in | Wt 188.0 lb

## 2018-05-24 DIAGNOSIS — M549 Dorsalgia, unspecified: Secondary | ICD-10-CM

## 2018-05-24 LAB — POC URINALSYSI DIPSTICK (AUTOMATED)
Bilirubin, UA: NEGATIVE
Blood, UA: NEGATIVE
Glucose, UA: NEGATIVE
Ketones, UA: NEGATIVE
Leukocytes, UA: NEGATIVE
Nitrite, UA: NEGATIVE
PH UA: 6.5 (ref 5.0–8.0)
Protein, UA: NEGATIVE
Spec Grav, UA: 1.015 (ref 1.010–1.025)
Urobilinogen, UA: 0.2 E.U./dL

## 2018-05-24 MED ORDER — TRAMADOL HCL 50 MG PO TABS: 50.0000 mg | ORAL_TABLET | Freq: Three times a day (TID) | ORAL | 0 refills | Status: DC | PRN

## 2018-05-24 MED ORDER — CIPROFLOXACIN HCL 250 MG PO TABS: 250.0000 mg | ORAL_TABLET | Freq: Two times a day (BID) | ORAL | 0 refills | Status: DC

## 2018-05-24 NOTE — Telephone Encounter (Signed)
Poct urine and culture placed.

## 2018-05-24 NOTE — Progress Notes (Signed)
   Subjective:    Patient ID: Karen Mcmahon, female    DOB: [birth year], 28 y.o.   MRN: [id number]  HPI  Virtual Visit via Telephone Note  I connected with Karen Mcmahon on 05/24/18 at 11:00 AM EDT by telephone and verified that I am speaking with the correct person using two identifiers.   I discussed the limitations, risks, security and privacy concerns of performing an evaluation and management service by telephone and the availability of in person appointments. I also discussed with the patient that there may be a patient responsible charge related to this service. The patient expressed understanding and agreed to proceed.    History of Present Illness: Pt in with some left side kidney area. Pt does not have any pain on urination. Pt states pain is constant regardless of position. Pt sates her pain level is 10.  Urine does not look blood. No pain radiating to stomach. She is urinating frequently. She states can't find a comfortable position.  Pt denies any history of kidney stone.  Pt never had any tramadol but is allergic to hydrocodone.  See last visit.   LMP- one week ago.  Pt not having any muscle spasms. No pain radiating to her abdomen.  I did explain where cva area and she reports pain in this area. Not pain in si area and reports no rash.     Observations/Objective: Pt observes pain mid lt cva area. Not describing si area. No mid lumbar pain described by pt. No rash reported.  Assessment and Plan:  You discussed pain that is more in the mid left costovertebral angle area pain.  With your description of pain, I do think it is best for [place] to have you come by and give a urinalysis so we can send out urine culture.  I did go ahead and send in tramadol to your pharmacy to help with pain and recommend that you get Aleve over-the-counter and take 1 to 2tablets twice daily if needed.  Hydrate well.  After you drop off urine for culture then can start low-dose Cipro twice daily  for 3 days pending urine culture.  Under the circumstances of no office visits presently, would need to watch you closely and advised that if your pain does not improve despite the above measures then would recommend ED evaluation.  In that event you might need CT imaging to evaluate if you might have a kidney stone.   Work excuse provided  for you to use until Friday if needed.  Could extend note as well.  Prescription advisement and caution regarding tramadol being controlled medication/narcotic and potential side effects.  So did advise not to operate any heavy machinery while on this medication.  Follow-up date to be determined. Follow Up Instructions:    I discussed the assessment and treatment plan with the patient. The patient was provided an opportunity to ask questions and all were answered. The patient agreed with the plan and demonstrated an understanding of the instructions.   The patient was advised to call back or seek an in-person evaluation if the symptoms worsen or if the condition fails to improve as anticipated.   I provided 20 minutes of non-face-to-face time during this encounter.         Objective:   Physical Exam        Assessment & Plan:

## 2018-05-24 NOTE — Telephone Encounter (Signed)
Copied from CRM [id number]. Topic: Quick Communication - Rx Refill/Question >> May 24, 2018  5:29 PM Fanny Bien wrote: Medication:traMADol (ULTRAM) 50 MG tablet [[id number]]. [facility] called and stated that two sets of directions are attached to this medication and would like to know the correct directions. Please advise

## 2018-05-24 NOTE — Patient Instructions (Addendum)
You discussed pain that is more in the mid left costovertebral angle area pain.  With your description of pain, I do think it is best for [place] to have you come by and give a urinalysis so we can send out urine culture.  I did go ahead and send in tramadol to your pharmacy to help with pain and recommend that you get Aleve over-the-counter and take 1 to 2 tablets twice daily if needed.  Hydrate well.  After you drop off urine for culture then can start low-dose Cipro twice daily for 3 days pending urine culture.  Under the circumstances of no office visits presently, would need to watch you closely and advised that if your pain does not improve despite the above measures then would recommend ED evaluation.  In that event you might need CT imaging to evaluate if you might have a kidney stone.   Work excuse provided  for you to use until Friday if needed.  Could extend note as well.  Prescription advisement and caution regarding tramadol being controlled medication/narcotic and potential side effects.  So did advise not to operate any heavy machinery while on this medication.  Follow-up date to be determined.

## 2018-05-24 NOTE — Addendum Note (Signed)
Addended by: Mervin Kung A on: 05/24/2018 03:51 PM   Modules accepted: Orders

## 2018-05-25 LAB — URINE CULTURE
MICRO NUMBER:: 352442
Result:: NO GROWTH
SPECIMEN QUALITY: ADEQUATE

## 2018-05-25 NOTE — Telephone Encounter (Signed)
Called to clarify that the sig for tramadol 1-2 tab po q 6 hours prn pain. Discussed with pharmacist.

## 2018-05-29 ENCOUNTER — Telehealth: Payer: Self-pay | Admitting: Medical

## 2018-05-29 NOTE — Telephone Encounter (Signed)
Copied from CRM [id number]. Topic: Quick Communication - Lab Results (Clinic Use ONLY) >> May 26, 2018  9:37 AM Orlene Och, [place] wrote: Called patient to inform them of 04/25/18 lab results. When patient returns call, triage nurse may disclose results.   Pt calling back for results. Please advise

## 2018-05-29 NOTE — Telephone Encounter (Signed)
Left VM to return call to office for lab results.

## 2018-05-29 NOTE — Telephone Encounter (Signed)
Phone call to pt. To inquire if she had any additional questions from her lab results of 05/24/18.  Stated she had no further questions.

## 2018-11-08 ENCOUNTER — Encounter: Payer: Self-pay | Admitting: Family Medicine

## 2018-11-08 ENCOUNTER — Other Ambulatory Visit: Payer: Self-pay

## 2018-11-08 ENCOUNTER — Ambulatory Visit (INDEPENDENT_AMBULATORY_CARE_PROVIDER_SITE_OTHER): Payer: BC Managed Care – PPO | Admitting: Family Medicine

## 2018-11-08 VITALS — BP 101/62 | HR 82 | Ht 67.0 in | Wt 192.0 lb

## 2018-11-08 DIAGNOSIS — N898 Other specified noninflammatory disorders of vagina: Secondary | ICD-10-CM

## 2018-11-08 MED ORDER — FLUCONAZOLE 150 MG PO TABS: 150.0000 mg | ORAL_TABLET | Freq: Once | ORAL | 3 refills | Status: AC

## 2018-11-08 NOTE — Progress Notes (Signed)
Patient has noticed discharge for a couple of weeks. Patient does states it itches a little bit. Patient denies any new partners.

## 2018-11-08 NOTE — Progress Notes (Signed)
   Subjective:    Patient ID: Karen Mcmahon, female    DOB: April 15, 1990, 28 y.o.   MRN: [id number]  HPI  Vaginal discharge x2-3 weeks with mild irritation. Gets yeast infections and BV.   Review of Systems     Objective:   Physical Exam Exam conducted with a chaperone present.  Cardiovascular:     Rate and Rhythm: Normal rate.     Pulses: Normal pulses.  Genitourinary:    Labia:        Right: No rash, tenderness or lesion.        Left: No rash, tenderness or lesion.   Neurological:     Mental Status: She is alert.        Assessment & Plan:  1. Vaginal discharge Diflucan prescribed. Culture obtained. - Cervicovaginal ancillary only( [facility])

## 2018-11-10 LAB — CERVICOVAGINAL ANCILLARY ONLY
Bacterial vaginitis: NEGATIVE
Candida vaginitis: NEGATIVE

## 2018-11-13 ENCOUNTER — Other Ambulatory Visit: Payer: Self-pay

## 2018-11-13 ENCOUNTER — Ambulatory Visit (INDEPENDENT_AMBULATORY_CARE_PROVIDER_SITE_OTHER): Payer: BC Managed Care – PPO | Admitting: Medical

## 2018-11-13 ENCOUNTER — Encounter: Payer: Self-pay | Admitting: Medical

## 2018-11-13 VITALS — BP 99/63 | HR 75 | Temp 98.2°F | Resp 16 | Ht 67.0 in | Wt 192.8 lb

## 2018-11-13 DIAGNOSIS — R2232 Localized swelling, mass and lump, left upper limb: Secondary | ICD-10-CM

## 2018-11-13 MED ORDER — TRAMADOL HCL 50 MG PO TABS: 50.0000 mg | ORAL_TABLET | Freq: Three times a day (TID) | ORAL | 0 refills | Status: DC | PRN

## 2018-11-13 MED ORDER — DICLOFENAC SODIUM 75 MG PO TBEC: 75.0000 mg | DELAYED_RELEASE_TABLET | Freq: Two times a day (BID) | ORAL | 0 refills | Status: DC

## 2018-11-13 NOTE — Addendum Note (Signed)
Addended by: Anabel Halon on: 11/13/2018 02:33 PM   Modules accepted: Orders

## 2018-11-13 NOTE — Progress Notes (Signed)
Subjective:    Patient ID: Karen Mcmahon, female    DOB: 11/17/1990, 28 y.o.   MRN: [id number]  HPI  Pt has small knot/possible cyst on her left distal forearm area. Present for about 2 weeks. No know trauma or fall.  Pt is rt handed.  Pt has mild pain left side forearm. Pt pain level high when arm hangs down.  LMP- October 16 2018.    Review of Systems  Constitutional: Negative for chills and fatigue.  Respiratory: Negative for chest tightness.   Genitourinary: Negative for dysuria.  Neurological: Negative for dizziness and light-headedness.  Hematological: Negative for adenopathy. Does not bruise/bleed easily.    Past Medical History:  Diagnosis Date  . Arthritis   . Hx of gonorrhea   . Keloid    Left Upper Lateral Earlobe  . Pregnant    hx  . Trichomonas contact, treated      Social History   Socioeconomic History  . Marital status: Single    Spouse name: Not on file  . Number of children: Not on file  . Years of education: Not on file  . Highest education level: Not on file  Occupational History  . Occupation: Insurance claims handler: [employer]  . Financial resource strain: Not on file  . Food insecurity    Worry: Not on file    Inability: Not on file  . Transportation needs    Medical: Not on file    Non-medical: Not on file  Tobacco Use  . Smoking status: Former Smoker    Packs/day: 0.50    Years: 2.00    Pack years: 1.00    Types: Cigarettes    Quit date: 03/18/2014    Years since quitting: 4.6  . Smokeless tobacco: Never Used  Substance and Sexual Activity  . Alcohol use: Yes    Comment: 1 mixed drink daily.  . Drug use: Yes    Types: Marijuana    Comment: 1 monthlast  . Sexual activity: Yes    Birth control/protection: None  Lifestyle  . Physical activity    Days per week: Not on file    Minutes per session: Not on file  . Stress: Not on file  Relationships  . Social Herbalist on phone: Not on file   Gets together: Not on file    Attends religious service: Not on file    Active member of club or organization: Not on file    Attends meetings of clubs or organizations: Not on file    Relationship status: Not on file  . Intimate partner violence    Fear of current or ex partner: Not on file    Emotionally abused: Not on file    Physically abused: Not on file    Forced sexual activity: Not on file  Other Topics Concern  . Not on file  Social History Narrative  . Not on file    Past Surgical History:  Procedure Laterality Date  . EAR CYST EXCISION Left 04/05/2014   Procedure: EXCISION EAR CYST;  Surgeon: Ruby Cola, MD;  Location: [facility];  Service: ENT;  Laterality: Left;  Excision of keloid  . EXTERNAL EAR SURGERY Left 12    Family History  Problem Relation Age of Onset  . Diabetes Mother   . Hypertension Mother   . Other Neg Hx     Allergies  Allergen Reactions  . Hydrocodone Itching  Current Outpatient Medications on File Prior to Visit  Medication Sig Dispense Refill  . ciprofloxacin (CIPRO) 250 MG tablet Take 1 tablet (250 mg total) by mouth 2 (two) times daily. (Patient not taking: Reported on 11/08/2018) 6 tablet 0  . metroNIDAZOLE (FLAGYL) 500 MG tablet Take 1 tablet (500 mg total) by mouth 2 (two) times daily. (Patient not taking: Reported on 11/08/2018) 14 tablet 0  . Multiple Vitamin (MULTI VITAMIN PO) Take 1 tablet by mouth daily.    . Multiple Vitamins-Minerals (HAIR/SKIN/NAILS PO) Take 1 tablet by mouth daily.    . traMADol (ULTRAM) 50 MG tablet Take 1 tablet (50 mg total) by mouth every 8 (eight) hours as needed for severe pain (1-2 tab po q 6 hours as needed severe pain). (Patient not taking: Reported on 11/08/2018) 16 tablet 0   No current facility-administered medications on file prior to visit.     BP 99/63   Pulse 75   Temp 98.2 F (36.8 C) (Temporal)   Resp 16   Ht 5\' 7"  (1.702 m)   Wt 192 lb 12.8 oz (87.5 kg)   LMP 10/16/2018   SpO2 100%   BMI  30.20 kg/m       Objective:   Physical Exam  General- no acute distress. Left upper ext- forearm. Dorsal aspect. Distal 1/3 area 2.5-3.0 cm mass. Lipoma vs ganglion cyst? Skin not red or warm      Assessment & Plan:  For your left forearm mass I want to refer you to sports med MD. They can help determine if lipoma or ganglion cyst.  Will rx diclofenac for pain and make tramadol available if needed  Follow up 10 days with me in event referral to sport med delay. Or as needed  Esperanza RichtersEdward Rechel Delosreyes, PA-C

## 2018-11-13 NOTE — Patient Instructions (Addendum)
For your left forearm mass I want to refer you to sports med MD. They can help determine if lipoma or ganglion cyst.  Will rx diclofenac for pain and make tramadol available if needed  Follow up 10 days with me in event referral to sport med delay. Or as needed

## 2018-11-15 ENCOUNTER — Ambulatory Visit (INDEPENDENT_AMBULATORY_CARE_PROVIDER_SITE_OTHER): Payer: BC Managed Care – PPO | Admitting: Family Medicine

## 2018-11-15 ENCOUNTER — Other Ambulatory Visit: Payer: Self-pay

## 2018-11-15 ENCOUNTER — Ambulatory Visit: Payer: Self-pay

## 2018-11-15 ENCOUNTER — Ambulatory Visit (HOSPITAL_BASED_OUTPATIENT_CLINIC_OR_DEPARTMENT_OTHER)
Admission: RE | Admit: 2018-11-15 | Discharge: 2018-11-15 | Disposition: A | Discharge status: Home / Self Care | Payer: BC Managed Care – PPO | Source: Ambulatory Visit | Attending: Family Medicine | Admitting: Family Medicine

## 2018-11-15 ENCOUNTER — Encounter: Payer: Self-pay | Admitting: Family Medicine

## 2018-11-15 VITALS — BP 96/67 | Ht 67.0 in | Wt 190.0 lb

## 2018-11-15 DIAGNOSIS — M79602 Pain in left arm: Secondary | ICD-10-CM | POA: Diagnosis not present

## 2018-11-15 NOTE — Assessment & Plan Note (Signed)
Has arisen recently late with no inciting event or trauma.  Elbow and wrist are still fully functional.  On ultrasound it is appearing in the soft tissue with some vascularity.  Noncompressible and not well-circumscribed. -X-ray. -MRI with and without contrast

## 2018-11-15 NOTE — Progress Notes (Signed)
Karen Mcmahon - 28 y.o. female MRN [id number]  Date of birth: 1990/04/06  SUBJECTIVE:  Including CC & ROS.  Chief Complaint  Patient presents with  . Arm Pain    left arm x 2 weeks    Karen Mcmahon is a 28 y.o. female that is presenting with a left forearm mass and left forearm pain.  The areas occurring over the posterior aspect of the mid forearm.  This is been acutely presenting over the past 2 weeks.  It is painful if touched.  It is localized to the area.  She denies any trauma or inciting event.  No prior history of similar instances.  No surgery.  She is right-handed.  The pain can be severe in nature.  It is sharp and stabbing.   Review of Systems  Constitutional: Negative for fever.  HENT: Negative for congestion.   Respiratory: Negative for cough.   Cardiovascular: Negative for chest pain.  Gastrointestinal: Negative for abdominal pain.  Musculoskeletal: Negative for gait problem.  Skin: Negative for color change.  Neurological: Negative for weakness.  Hematological: Negative for adenopathy.    HISTORY: Past Medical, Surgical, Social, and Family History Reviewed & Updated per EMR.   Pertinent Historical Findings include:  Past Medical History:  Diagnosis Date  . Arthritis   . Hx of gonorrhea   . Keloid    Left Upper Lateral Earlobe  . Pregnant    hx  . Trichomonas contact, treated     Past Surgical History:  Procedure Laterality Date  . EAR CYST EXCISION Left 04/05/2014   Procedure: EXCISION EAR CYST;  Surgeon: Melvenia BeamMitchell Gore, MD;  [facility];  Service: ENT;  Laterality: Left;  Excision of keloid  . EXTERNAL EAR SURGERY Left 12    Allergies  Allergen Reactions  . Hydrocodone Itching    Family History  Problem Relation Age of Onset  . Diabetes Mother   . Hypertension Mother   . Other Neg Hx      Social History   Socioeconomic History  . Marital status: Single    Spouse name: Not on file  . Number of children: Not on file  . Years of  education: Not on file  . Highest education level: Not on file  Occupational History  . Occupation: Programmer, multimediafork lift driver    Employer: [employer]  Social Needs  . Financial resource strain: Not on file  . Food insecurity    Worry: Not on file    Inability: Not on file  . Transportation needs    Medical: Not on file    Non-medical: Not on file  Tobacco Use  . Smoking status: Former Smoker    Packs/day: 0.50    Years: 2.00    Pack years: 1.00    Types: Cigarettes    Quit date: 03/18/2014    Years since quitting: 4.6  . Smokeless tobacco: Never Used  Substance and Sexual Activity  . Alcohol use: Yes    Comment: 1 mixed drink daily.  . Drug use: Yes    Types: Marijuana    Comment: 1 monthlast  . Sexual activity: Yes    Birth control/protection: None  Lifestyle  . Physical activity    Days per week: Not on file    Minutes per session: Not on file  . Stress: Not on file  Relationships  . Social Musicianconnections    Talks on phone: Not on file    Gets together: Not on file    Attends  religious service: Not on file    Active member of club or organization: Not on file    Attends meetings of clubs or organizations: Not on file    Relationship status: Not on file  . Intimate partner violence    Fear of current or ex partner: Not on file    Emotionally abused: Not on file    Physically abused: Not on file    Forced sexual activity: Not on file  Other Topics Concern  . Not on file  Social History Narrative  . Not on file     PHYSICAL EXAM:  VS: BP 96/67   Ht 5\' 7"  (1.702 m)   Wt 190 lb (86.2 kg)   LMP 10/16/2018   BMI 29.76 kg/m  Physical Exam Gen: NAD, alert, cooperative with exam, well-appearing ENT: normal lips, normal nasal mucosa,  Eye: normal EOM, normal conjunctiva and lids CV:  no edema, +2 pedal pulses   Resp: no accessory muscle use, non-labored,  Skin: no rashes, no areas of induration  Neuro: normal tone, normal sensation to touch Psych:  normal insight, alert  and oriented MSK:  Left forearm: Tender mass that is firm to palpation over the posterior aspect of the forearm. No swelling or ecchymosis. Normal wrist range of motion. Normal strength resistance with finger abduction and abduction. Normal grip strength. Normal pincer grasp. Normal strength resistive thumb extension. Neurovascular intact  Limited ultrasound: Left forearm:  Hypoechoic that is not well-circumscribed in the soft tissue of the forearm.  It does not appear to be included within the fascia or muscle matrix.  There does appear to be vascularity to this region.  It is roughly 2 cm x 2 cm  Summary: Mass may be concerning for a tumor  Ultrasound and interpretation by Clearance Coots, MD      ASSESSMENT & PLAN:   Left arm pain Has arisen recently late with no inciting event or trauma.  Elbow and wrist are still fully functional.  On ultrasound it is appearing in the soft tissue with some vascularity.  Noncompressible and not well-circumscribed. -X-ray. -MRI with and without contrast

## 2018-11-15 NOTE — Patient Instructions (Signed)
Nice to meet you I will call you with the results from today  You will get a call to set up the MRI  Please send me a message in [place] with any questions or updates.  We will schedule an appointment once the MRI is completed.   --Dr. Raeford Razor

## 2018-11-16 ENCOUNTER — Telehealth: Payer: Self-pay | Admitting: Family Medicine

## 2018-11-16 NOTE — Telephone Encounter (Signed)
Spoke to patient and gave her information provided by the physician about results.

## 2018-11-16 NOTE — Telephone Encounter (Signed)
Left VM for patient. If she calls back please have her speak with a nurse/CMA and inform that the xray doesn't show the mass with any extension to the bone. The MRI is still needed.   If any questions then please take the best time and phone number to call and I will try to call her back.   Rosemarie Ax, MD [facility] 11/16/2018, 11:53 AM  ;

## 2018-11-16 NOTE — Telephone Encounter (Signed)
Patient returning call for results 

## 2018-11-29 ENCOUNTER — Other Ambulatory Visit: Payer: Self-pay

## 2018-11-29 ENCOUNTER — Encounter: Payer: Self-pay | Admitting: Obstetrics and Gynecology

## 2018-11-29 ENCOUNTER — Ambulatory Visit (INDEPENDENT_AMBULATORY_CARE_PROVIDER_SITE_OTHER): Payer: BC Managed Care – PPO | Admitting: Obstetrics and Gynecology

## 2018-11-29 VITALS — BP 104/70 | HR 75 | Ht 67.0 in | Wt 189.0 lb

## 2018-11-29 DIAGNOSIS — B9689 Other specified bacterial agents as the cause of diseases classified elsewhere: Secondary | ICD-10-CM

## 2018-11-29 DIAGNOSIS — N760 Acute vaginitis: Secondary | ICD-10-CM

## 2018-11-29 DIAGNOSIS — Z113 Encounter for screening for infections with a predominantly sexual mode of transmission: Secondary | ICD-10-CM

## 2018-11-29 DIAGNOSIS — N898 Other specified noninflammatory disorders of vagina: Secondary | ICD-10-CM

## 2018-11-29 DIAGNOSIS — Z8742 Personal history of other diseases of the female genital tract: Secondary | ICD-10-CM

## 2018-11-29 NOTE — Progress Notes (Signed)
Obstetrics and Gynecology Visit Return Patient Evaluation  Appointment Date: 11/29/2018  Primary Care Provider: Mackie Pai  OBGYN Clinic: [facility]  Chief Complaint: persistent vaginal discharge   History of Present Illness:  Karen Mcmahon is a 28 y.o. with above CC. She saw dr Nehemiah Settle earlier this month for the discharge and was given diflucan but with no chang e in s/s. She did have similar s/s earlier this year that was helped with flagyl; the swab from earlier this month was neg for bv and yeast The s/s started a few weeks before her visit in early September and it's a white-yellow discharge with no irritation or itching or smell. She does not use tampons and does not douche or use lubricants, condoms  Review of Systems:  as noted in the History of Present Illness.   Allergies: is allergic to hydrocodone.  Physical Exam:  BP 104/70   Pulse 75   Ht 5\' 7"  (1.702 m)   Wt 189 lb (85.7 kg)   LMP 11/16/2018   BMI 29.60 kg/m  Body mass index is 29.6 kg/m. General appearance: Well nourished, well developed female in no acute distress.  Abdomen: diffusely non tender to palpation, non distended, and no masses, hernias Neuro/Psych:  Normal mood and affect.    Pelvic exam:  EGBUS normal Vagina white, slight yellow dishcarge, no smell Cervix normal, nttp  Assessment: pt stable  Plan:  1. History of recurrent vaginal discharge Swab obtained today (no gel used on exam). D/w her recommend holding off on any potential treatments until results are back - Cervicovaginal ancillary only( [place] [facility])  RTC: PRN  Durene Romans MD Attending [facility])

## 2018-11-30 DIAGNOSIS — Z8742 Personal history of other diseases of the female genital tract: Secondary | ICD-10-CM | POA: Insufficient documentation

## 2018-12-04 ENCOUNTER — Telehealth: Payer: Self-pay

## 2018-12-04 LAB — CERVICOVAGINAL ANCILLARY ONLY
Bacterial Vaginitis (gardnerella): POSITIVE — AB
Candida Glabrata: NEGATIVE
Candida Vaginitis: NEGATIVE
Chlamydia: NEGATIVE
Neisseria Gonorrhea: NEGATIVE
Trichomonas: NEGATIVE

## 2018-12-04 MED ORDER — METRONIDAZOLE 500 MG PO TABS: 500.0000 mg | ORAL_TABLET | Freq: Two times a day (BID) | ORAL | 0 refills | Status: DC

## 2018-12-04 NOTE — Telephone Encounter (Signed)
Patient called wanting results from vaginal culture.Patient made aware that she has bacterial vaginosis- will send in treatment per protocol.  Kathrene Alu RN

## 2018-12-11 ENCOUNTER — Telehealth: Payer: Self-pay

## 2018-12-11 NOTE — Telephone Encounter (Signed)
-----   Message from Aletha Halim, MD sent at 12/08/2018 11:19 AM EDT ----- Can you let her know that I called her at [phone] and unable to leave a VM to go over results? thanks

## 2018-12-11 NOTE — Telephone Encounter (Signed)
Called pt regarding BV results. Left message for pt to call the office back. chiquita l wilson, CMA

## 2018-12-15 ENCOUNTER — Other Ambulatory Visit: Payer: BC Managed Care – PPO

## 2018-12-26 ENCOUNTER — Ambulatory Visit: Payer: Self-pay | Admitting: *Deleted

## 2018-12-26 NOTE — Telephone Encounter (Signed)
Do agree ED best option since she has shortness of breath with chest pain. Hx of costochondritis in past but with sob best to be seen in ED.

## 2018-12-26 NOTE — Telephone Encounter (Signed)
FYI. Pt headed to ED.  

## 2018-12-26 NOTE — Telephone Encounter (Signed)
Pt called in c/o chest pain in the middle of her chest and having difficulty breathing, feeling short winded with activity.   She noticed the pain in her chest when she moves or stretches. She has been to [facility] ED with this same pain before and was told it was inflammation and was given an anti-inflammatory.   It did help. See triage notes for full details.  I used the chest pain protocol however per what she told me the protocol was to be seen within 3 days since this had happened before and she was having the same pain.  I called into [facility] office and spoke with [place].   They checked with Percell Miller and he preferred she go to the ED.  I let the pt know this and she is going to [facility] ED now.    She was agreeable to going to the ED.     I sent these notes to the office. Reason for Disposition . [1] Chest pain(s) lasting a few seconds AND [2] persists > 3 days  Answer Assessment - Initial Assessment Questions 1. LOCATION: "Where does it hurt?"       It's been happening for a couple of years now.   I've been to [facility] ED.   I was given an antiinflammatory.     It hurts in the middle between my breasts. 2. RADIATION: "Does the pain go anywhere else?" (e.g., into neck, jaw, arms, back)     No 3. ONSET: "When did the chest pain begin?" (Minutes, hours or days)      Going on for a couple of weeks.   Yesterday it go bad.   I didn't go to work yesterday.  It eased a little.   If I move the wrong way or stretch it hurts in the middle of my chest.   4. PATTERN "Does the pain come and go, or has it been constant since it started?"  "Does it get worse with exertion?"      It comes and goes.    5. DURATION: "How long does it last" (e.g., seconds, minutes, hours)     It lasts all day. 6. SEVERITY: "How bad is the pain?"  (e.g., Scale 1-10; mild, moderate, or severe)    - MILD (1-3): doesn't interfere with normal activities     - MODERATE (4-7): interferes with normal activities or  awakens from sleep    - SEVERE (8-10): excruciating pain, unable to do any normal activities       5 on pain scale 7. CARDIAC RISK FACTORS: "Do you have any history of heart problems or risk factors for heart disease?" (e.g., angina, prior heart attack; diabetes, high blood pressure, high cholesterol, smoker, or strong family history of heart disease)     No    ED has found no heart problems.    The chest x-ray shows I'm inflamed and swollen. 8. PULMONARY RISK FACTORS: "Do you have any history of lung disease?"  (e.g., blood clots in lung, asthma, emphysema, birth control pills)     I smoke.   No other risks. 9. CAUSE: "What do you think is causing the chest pain?"     I don't know.   The pain is similair to the pain I had in the past. 10. OTHER SYMPTOMS: "Do you have any other symptoms?" (e.g., dizziness, nausea, vomiting, sweating, fever, difficulty breathing, cough)       Difficulty breathing short winded with activity.  No nausea.  No other symptoms.    When I take a deep breath it hurts a little. 11. PREGNANCY: "Is there any chance you are pregnant?" "When was your last menstrual period?"       Maybe  Protocols used: CHEST PAIN-A-AH

## 2019-01-10 ENCOUNTER — Other Ambulatory Visit: Payer: Self-pay

## 2019-01-10 DIAGNOSIS — Z20822 Contact with and (suspected) exposure to covid-19: Secondary | ICD-10-CM

## 2019-01-11 LAB — NOVEL CORONAVIRUS, NAA: SARS-CoV-2, NAA: NOT DETECTED

## 2019-05-27 ENCOUNTER — Encounter (HOSPITAL_BASED_OUTPATIENT_CLINIC_OR_DEPARTMENT_OTHER): Payer: Self-pay | Admitting: Emergency Medicine

## 2019-05-27 ENCOUNTER — Emergency Department (HOSPITAL_BASED_OUTPATIENT_CLINIC_OR_DEPARTMENT_OTHER)
Admission: EM | Admit: 2019-05-27 | Discharge: 2019-05-27 | Disposition: A | Discharge status: Home / Self Care | Payer: BC Managed Care – PPO | Attending: Emergency Medicine | Admitting: Emergency Medicine

## 2019-05-27 ENCOUNTER — Other Ambulatory Visit: Payer: Self-pay

## 2019-05-27 DIAGNOSIS — Z87891 Personal history of nicotine dependence: Secondary | ICD-10-CM | POA: Insufficient documentation

## 2019-05-27 DIAGNOSIS — Y929 Unspecified place or not applicable: Secondary | ICD-10-CM | POA: Insufficient documentation

## 2019-05-27 DIAGNOSIS — W260XXA Contact with knife, initial encounter: Secondary | ICD-10-CM | POA: Diagnosis not present

## 2019-05-27 DIAGNOSIS — Y999 Unspecified external cause status: Secondary | ICD-10-CM | POA: Insufficient documentation

## 2019-05-27 DIAGNOSIS — Y939 Activity, unspecified: Secondary | ICD-10-CM | POA: Insufficient documentation

## 2019-05-27 DIAGNOSIS — Z79899 Other long term (current) drug therapy: Secondary | ICD-10-CM | POA: Diagnosis not present

## 2019-05-27 DIAGNOSIS — S61211A Laceration without foreign body of left index finger without damage to nail, initial encounter: Secondary | ICD-10-CM | POA: Insufficient documentation

## 2019-05-27 MED ORDER — LIDOCAINE-EPINEPHRINE (PF) 1 %-1:200000 IJ SOLN
INTRAMUSCULAR | Status: AC
  Filled 2019-05-27: qty 30

## 2019-05-27 NOTE — ED Provider Notes (Signed)
[facility] Provider Note: Georgena Spurling, MD, FACEP  CSN: [id number] MRN: [id number] ARRIVAL: 05/27/19 at 2111 ROOM: [place]  Extremity Laceration   HISTORY OF PRESENT ILLNESS  05/27/19 11:01 PM Karen Mcmahon is a 29 y.o. female who cut his left index finger with a knife just prior to arrival.  He rates associated pain is a 7 out of 10, worse with movement or palpation.  He describes the pain is throbbing in nature.  Bleeding has been controlled.  The knife was clean.  Tetanus is up-to-date.   Past Medical History:  Diagnosis Date  . Arthritis   . Hx of gonorrhea   . Keloid    Left Upper Lateral [place]  . Pregnant    hx  . Trichomonas contact, treated     Past Surgical History:  Procedure Laterality Date  . EAR CYST EXCISION Left 04/05/2014   Procedure: EXCISION EAR CYST;  Surgeon: Ruby Cola, MD;  Location: [facility];  Service: ENT;  Laterality: Left;  Excision of keloid  . EXTERNAL EAR SURGERY Left 12    Family History  Problem Relation Age of Onset  . Diabetes Mother   . Hypertension Mother   . Other Neg Hx     Social History   Tobacco Use  . Smoking status: Former Smoker    Packs/day: 0.50    Years: 2.00    Pack years: 1.00    Types: Cigarettes    Quit date: 03/18/2014    Years since quitting: 5.1  . Smokeless tobacco: Never Used  Substance Use Topics  . Alcohol use: Yes    Comment: 1 mixed drink daily.  . Drug use: Yes    Types: Marijuana    Comment: 1 monthlast    Prior to Admission medications   Medication Sig Start Date End Date Taking? Authorizing Provider  Multiple Vitamin (MULTI VITAMIN PO) Take 1 tablet by mouth daily.    [provider]  Multiple Vitamins-Minerals (HAIR/SKIN/NAILS PO) Take 1 tablet by mouth daily.    [provider]    Allergies Hydrocodone   REVIEW OF SYSTEMS  Negative except as noted here or in the History of Present Illness.   PHYSICAL EXAMINATION  Initial  Vital Signs Blood pressure 102/70, pulse 88, temperature 98 F (36.7 C), temperature source Oral, resp. rate 16, height 5\' 8"  (1.727 m), weight 81.6 kg, SpO2 100 %.  Examination General: Well-developed, well-nourished female in no acute distress; appearance consistent with age of record HENT: normocephalic; atraumatic Eyes: Normal appearance Neck: supple Heart: regular rate and rhythm Lungs: clear to auscultation bilaterally Abdomen: soft; nondistended; nontender; bowel sounds present Extremities: No deformity; full range of motion; pulses normal; laceration tip of left index finger, finger distally neurovascularly intact Neurologic: Awake, alert and oriented; motor function intact in all extremities and symmetric; no facial droop Skin: Warm and dry Psychiatric: Normal mood and affect   RESULTS  Summary of this visit's results, reviewed and interpreted by myself:   EKG Interpretation  Date/Time:    Ventricular Rate:    PR Interval:    QRS Duration:   QT Interval:    QTC Calculation:   R Axis:     Text Interpretation:        Laboratory Studies: No results found for this or any previous visit (from the past 24 hour(s)). Imaging Studies: No results found.  ED COURSE and MDM  Nursing notes, initial and subsequent vitals signs, including pulse oximetry, reviewed  and interpreted by myself.  Vitals:   05/27/19 2117 05/27/19 2119  BP:  102/70  Pulse:  88  Resp:  16  Temp:  98 F (36.7 C)  TempSrc:  Oral  SpO2:  100%  Weight: 81.6 kg   Height: 5\' 8"  (1.727 m)    Medications  lidocaine-EPINEPHrine (XYLOCAINE-EPINEPHrine) 1 %-1:200000 (PF) injection (has no administration in time range)   Wound is clean and I do not believe antibiotic prophylaxis is indicated at this time.  She was advised to return if signs of infection develop.   PROCEDURES  Procedures  LACERATION REPAIR Performed by: Dietra Stokely Authorized by: Carlisle Beers Valentine Barney Consent: Verbal consent  obtained. Risks and benefits: risks, benefits and alternatives were discussed Consent given by: patient Patient identity confirmed: provided demographic data Prepped and Draped in normal sterile fashion Wound explored  Laceration Location: Left index finger  Laceration Length: 2 cm  No Foreign Bodies seen or palpated  Anesthesia: local infiltration  Local anesthetic: lidocaine 1% with epinephrine  Anesthetic total: 2 ml  Irrigation method: syringe Amount of cleaning: standard  Skin closure: 4-0 Prolene  Number of sutures: 3  Technique: Simple interrupted  Patient tolerance: Patient tolerated the procedure well with no immediate complications.    ED DIAGNOSES     ICD-10-CM   1. Laceration of left index finger without foreign body without damage to nail, initial encounter  Carlisle Beers        [id number], MD 05/27/19 2324

## 2019-05-27 NOTE — ED Notes (Signed)
Pharmacy and medications updated with patient 

## 2019-05-27 NOTE — ED Triage Notes (Signed)
Reports cutting left pointer finger with a knife while trying to cut something.

## 2019-05-27 NOTE — ED Notes (Signed)
Suture cart in room, setup on left side of bed for procedure

## 2019-05-27 NOTE — ED Notes (Signed)
ED Provider at bedside. 

## 2020-02-25 ENCOUNTER — Encounter (HOSPITAL_BASED_OUTPATIENT_CLINIC_OR_DEPARTMENT_OTHER): Payer: Self-pay | Admitting: *Deleted

## 2020-02-25 ENCOUNTER — Other Ambulatory Visit: Payer: Self-pay

## 2020-02-25 DIAGNOSIS — Z5321 Procedure and treatment not carried out due to patient leaving prior to being seen by health care provider: Secondary | ICD-10-CM | POA: Insufficient documentation

## 2020-02-25 DIAGNOSIS — H938X1 Other specified disorders of right ear: Secondary | ICD-10-CM | POA: Insufficient documentation

## 2020-02-25 MED ORDER — ACETAMINOPHEN 325 MG PO TABS
650.0000 mg | ORAL_TABLET | Freq: Once | ORAL | Status: AC
  Administered 2020-02-25: 650 mg via ORAL

## 2020-02-25 MED ORDER — ACETAMINOPHEN 325 MG PO TABS
ORAL_TABLET | ORAL | Status: AC
  Filled 2020-02-25: qty 2

## 2020-02-25 NOTE — ED Triage Notes (Signed)
Swelling and pain to her right ear for a few days.

## 2020-02-26 ENCOUNTER — Emergency Department (HOSPITAL_BASED_OUTPATIENT_CLINIC_OR_DEPARTMENT_OTHER)
Admission: EM | Admit: 2020-02-26 | Discharge: 2020-02-26 | Disposition: A | Discharge status: Home / Self Care | Payer: BC Managed Care – PPO | Attending: Emergency Medicine | Admitting: Emergency Medicine

## 2020-03-01 NOTE — L&D Delivery Note (Signed)
LABOR COURSE Patient is a 30 y.o. [id number] at [redacted]w[redacted]d who presented for IOL due to mild pre-eclampsia and A1GDM on 12/24/20. Induction was performed with cytotec x2, foley balloon, and pitocin. AROM around 11 AM on 12/25/20 with clear fluid. Patient received epidural for pain relief. Complete and then labored down .    Delivery Note Called to room and patient was complete and pushing. Head delivered LOA. No nuchal cord present. Shoulder and body delivered in usual fashion. At  2123 a viable female was delivered via Vaginal, Spontaneous (Presentation: vertex; LOA).  Infant with spontaneous cry, placed on mother's abdomen, dried and stimulated. Cord clamped x 2 after 2-minute delay, and cut by father of baby. Cord blood drawn. Placenta delivered spontaneously with gentle cord traction. Appears intact. Fundus firm with massage and Pitocin. Labia, perineum, vagina, and cervix inspected with no evidence of trauma.    APGAR: 8,9; weight  4252 g.   Cord: 3VC with the following complications:none.   Cord pH: n/a  Anesthesia: Epidural Episiotomy: None Lacerations: None Suture Repair:  n/a Est. Blood Loss (mL): 850  Mom to postpartum.  Baby to Couplet care / Skin to Skin.  Reeves Forth, MD 12/25/20 9:48 PM

## 2020-05-28 ENCOUNTER — Other Ambulatory Visit: Payer: Self-pay

## 2020-05-28 ENCOUNTER — Ambulatory Visit (INDEPENDENT_AMBULATORY_CARE_PROVIDER_SITE_OTHER): Payer: Medicaid Other

## 2020-05-28 VITALS — BP 117/76 | HR 86 | Ht 67.0 in | Wt 195.1 lb

## 2020-05-28 DIAGNOSIS — O3680X0 Pregnancy with inconclusive fetal viability, not applicable or unspecified: Secondary | ICD-10-CM | POA: Diagnosis not present

## 2020-05-28 DIAGNOSIS — Z3401 Encounter for supervision of normal first pregnancy, first trimester: Secondary | ICD-10-CM

## 2020-05-28 DIAGNOSIS — Z789 Other specified health status: Secondary | ICD-10-CM

## 2020-05-28 DIAGNOSIS — Z3A08 8 weeks gestation of pregnancy: Secondary | ICD-10-CM | POA: Diagnosis not present

## 2020-05-28 DIAGNOSIS — Z3481 Encounter for supervision of other normal pregnancy, first trimester: Secondary | ICD-10-CM | POA: Diagnosis not present

## 2020-05-28 DIAGNOSIS — O219 Vomiting of pregnancy, unspecified: Secondary | ICD-10-CM

## 2020-05-28 DIAGNOSIS — Z3491 Encounter for supervision of normal pregnancy, unspecified, first trimester: Secondary | ICD-10-CM | POA: Insufficient documentation

## 2020-05-28 DIAGNOSIS — O0990 Supervision of high risk pregnancy, unspecified, unspecified trimester: Secondary | ICD-10-CM | POA: Insufficient documentation

## 2020-05-28 MED ORDER — DICLEGIS 10-10 MG PO TBEC: 2.0000 | DELAYED_RELEASE_TABLET | Freq: Every day | ORAL | 2 refills | Status: DC

## 2020-05-28 NOTE — Progress Notes (Signed)
PRENATAL INTAKE SUMMARY  Ms. Robledo presents today New OB Nurse Interview.  OB History    Gravida  4   Para  1   Term  1   Preterm      AB  2   Living  1     SAB  1   IAB  1   Ectopic      Multiple      Live Births  1          I have reviewed the patient's medical, obstetrical, social, and family histories, medications, and available lab results.  SUBJECTIVE She has no unusual complaints  OBJECTIVE Initial Physical Exam (New OB)  GENERAL APPEARANCE: alert, well appearing   ASSESSMENT Normal pregnancy  PLAN Prenatal care to be completed at [facility] labs to be completed at [facility] OB visit Baby Scripts ordered Blood pressure kit given U/S performed today reveals single live IUP at [redacted]w[redacted]d by [place]. FHR 165 PHQ 2 score: 1 GAD 7 score: 5

## 2020-06-09 ENCOUNTER — Encounter: Payer: Medicaid Other | Admitting: Obstetrics and Gynecology

## 2020-06-09 ENCOUNTER — Other Ambulatory Visit (HOSPITAL_COMMUNITY)
Admission: RE | Admit: 2020-06-09 | Discharge: 2020-06-09 | Disposition: A | Discharge status: Home / Self Care | Payer: Medicaid Other | Source: Ambulatory Visit | Attending: Obstetrics and Gynecology | Admitting: Obstetrics and Gynecology

## 2020-06-09 ENCOUNTER — Ambulatory Visit (INDEPENDENT_AMBULATORY_CARE_PROVIDER_SITE_OTHER): Payer: Medicaid Other | Admitting: Obstetrics and Gynecology

## 2020-06-09 ENCOUNTER — Encounter: Payer: Self-pay | Admitting: Obstetrics and Gynecology

## 2020-06-09 ENCOUNTER — Other Ambulatory Visit: Payer: Self-pay

## 2020-06-09 VITALS — BP 113/74 | HR 92 | Wt 197.0 lb

## 2020-06-09 DIAGNOSIS — Z3A10 10 weeks gestation of pregnancy: Secondary | ICD-10-CM | POA: Diagnosis not present

## 2020-06-09 DIAGNOSIS — Z3481 Encounter for supervision of other normal pregnancy, first trimester: Secondary | ICD-10-CM

## 2020-06-09 DIAGNOSIS — O99210 Obesity complicating pregnancy, unspecified trimester: Secondary | ICD-10-CM | POA: Insufficient documentation

## 2020-06-09 LAB — OB RESULTS CONSOLE GC/CHLAMYDIA: Gonorrhea: NEGATIVE

## 2020-06-09 MED ORDER — PREPLUS 27-1 MG PO TABS: 1.0000 | ORAL_TABLET | Freq: Every day | ORAL | 13 refills | Status: DC

## 2020-06-09 MED ORDER — ASPIRIN EC 81 MG PO TBEC: 81.0000 mg | DELAYED_RELEASE_TABLET | Freq: Every day | ORAL | 2 refills | Status: DC

## 2020-06-09 NOTE — Progress Notes (Signed)
Pt presents today for NOB visit. Pt states she may have yeast infection.

## 2020-06-09 NOTE — Patient Instructions (Signed)
 Obstetrics: Normal and Problem Pregnancies (7th ed., pp. 102-121). [place], [place]: [employer]."> Textbook of Family Medicine (9th ed., pp. 365-410). [place], [place]: [employer].">  First Trimester of Pregnancy  The first trimester of pregnancy starts on the first day of your last menstrual period until the end of week 12. This is months 1 through 3 of pregnancy. A week after a sperm fertilizes an egg, the egg will implant into the wall of the uterus and begin to develop into a baby. By the end of 12 weeks, all the baby's organs will be formed and the baby will be 2-3 inches in size. Body changes during your first trimester Your body goes through many changes during pregnancy. The changes vary and generally return to normal after your baby is born. Physical changes  You may gain or lose weight.  Your breasts may begin to grow larger and become tender. The tissue that surrounds your nipples (areola) may become darker.  Dark spots or blotches (chloasma or mask of pregnancy) may develop on your face.  You may have changes in your hair. These can include thickening or thinning of your hair or changes in texture. Health changes  You may feel nauseous, and you may vomit.  You may have heartburn.  You may develop headaches.  You may develop constipation.  Your gums may bleed and may be sensitive to brushing and flossing. Other changes  You may tire easily.  You may urinate more often.  Your menstrual periods will stop.  You may have a loss of appetite.  You may develop cravings for certain kinds of food.  You may have changes in your emotions from day to day.  You may have more vivid and strange dreams. Follow these instructions at home: Medicines  Follow your health care provider's instructions regarding medicine use. Specific medicines may be either safe or unsafe to take during pregnancy. Do not take any medicines unless told to by your health care provider.  Take  a prenatal vitamin that contains at least 600 micrograms (mcg) of folic acid. Eating and drinking  Eat a healthy diet that includes fresh fruits and vegetables, whole grains, good sources of protein such as meat, eggs, or tofu, and low-fat dairy products.  Avoid raw meat and unpasteurized juice, milk, and cheese. These carry germs that can harm you and your baby.  If you feel nauseous or you vomit: ? Eat 4 or 5 small meals a day instead of 3 large meals. ? Try eating a few soda crackers. ? Drink liquids between meals instead of during meals.  You may need to take these actions to prevent or treat constipation: ? Drink enough fluid to keep your urine pale yellow. ? Eat foods that are high in fiber, such as beans, whole grains, and fresh fruits and vegetables. ? Limit foods that are high in fat and processed sugars, such as fried or sweet foods. Activity  Exercise only as directed by your health care provider. Most people can continue their usual exercise routine during pregnancy. Try to exercise for 30 minutes at least 5 days a week.  Stop exercising if you develop pain or cramping in the lower abdomen or lower back.  Avoid exercising if it is very hot or humid or if you are at high altitude.  Avoid heavy lifting.  If you choose to, you may have sex unless your health care provider tells you not to. Relieving pain and discomfort  Wear a good support bra to relieve   breast tenderness.  Rest with your legs elevated if you have leg cramps or low back pain.  If you develop bulging veins (varicose veins) in your legs: ? Wear support hose as told by your health care provider. ? Elevate your feet for 15 minutes, 3-4 times a day. ? Limit salt in your diet. Safety  Wear your seat belt at all times when driving or riding in a car.  Talk with your health care provider if someone is verbally or physically abusive to you.  Talk with your health care provider if you are feeling sad or have  thoughts of hurting yourself. Lifestyle  Do not use hot tubs, steam rooms, or saunas.  Do not douche. Do not use tampons or scented sanitary pads.  Do not use herbal remedies, alcohol, illegal drugs, or medicines that are not approved by your health care provider. Chemicals in these products can harm your baby.  Do not use any products that contain nicotine or tobacco, such as cigarettes, e-cigarettes, and chewing tobacco. If you need help quitting, ask your health care provider.  Avoid cat litter boxes and soil used by cats. These carry germs that can cause birth defects in the baby and possibly loss of the unborn baby (fetus) by miscarriage or stillbirth. General instructions  During routine prenatal visits in the first trimester, your health care provider will do a physical exam, perform necessary tests, and ask you how things are going. Keep all follow-up visits. This is important.  Ask for help if you have counseling or nutritional needs during pregnancy. Your health care provider can offer advice or refer you to specialists for help with various needs.  Schedule a dentist appointment. At home, brush your teeth with a soft toothbrush. Floss gently.  Write down your questions. Take them to your prenatal visits. Where to find more information  American Pregnancy Association: americanpregnancy.org  American College of Obstetricians and Gynecologists: acog.org/en/Womens%20Health/Pregnancy  Office on Women's Health: womenshealth.gov/pregnancy Contact a health care provider if you have:  Dizziness.  A fever.  Mild pelvic cramps, pelvic pressure, or nagging pain in the abdominal area.  Nausea, vomiting, or diarrhea that lasts for 24 hours or longer.  A bad-smelling vaginal discharge.  Pain when you urinate.  Known exposure to a contagious illness, such as chickenpox, measles, Zika virus, HIV, or hepatitis. Get help right away if you have:  Spotting or bleeding from your  vagina.  Severe abdominal cramping or pain.  Shortness of breath or chest pain.  Any kind of trauma, such as from a fall or a car crash.  New or increased pain, swelling, or redness in an arm or leg. Summary  The first trimester of pregnancy starts on the first day of your last menstrual period until the end of week 12 (months 1 through 3).  Eating 4 or 5 small meals a day rather than 3 large meals may help to relieve nausea and vomiting.  Do not use any products that contain nicotine or tobacco, such as cigarettes, e-cigarettes, and chewing tobacco. If you need help quitting, ask your health care provider.  Keep all follow-up visits. This is important. This information is not intended to replace advice given to you by your health care provider. Make sure you discuss any questions you have with your health care provider. Document Revised: 07/25/2019 Document Reviewed: 05/31/2019 Else[employer] Patient Education  2021 [employer]   Second Trimester of Pregnancy  The second trimester of pregnancy is from week 13 through week   27. This is months 4 through 6 of pregnancy. The second trimester is often a time when you feel your best. Your body has adjusted to being pregnant, and you begin to feel better physically. During the second trimester:  Morning sickness has lessened or stopped completely.  You may have more energy.  You may have an increase in appetite. The second trimester is also a time when the unborn baby (fetus) is growing rapidly. At the end of the sixth month, the fetus may be up to 12 inches long and weigh about 1 pounds. You will likely begin to feel the baby move (quickening) between 16 and 20 weeks of pregnancy. Body changes during your second trimester Your body continues to go through many changes during your second trimester. The changes vary and generally return to normal after the baby is born. Physical changes  Your weight will continue to increase. You will  notice your lower abdomen bulging out.  You may begin to get stretch marks on your hips, abdomen, and breasts.  Your breasts will continue to grow and to become tender.  Dark spots or blotches (chloasma or mask of pregnancy) may develop on your face.  A dark line from your belly button to the pubic area (linea nigra) may appear.  You may have changes in your hair. These can include thickening of your hair, rapid growth, and changes in texture. Some people also have hair loss during or after pregnancy, or hair that feels dry or thin. Health changes  You may develop headaches.  You may have heartburn.  You may develop constipation.  You may develop hemorrhoids or swollen, bulging veins (varicose veins).  Your gums may bleed and may be sensitive to brushing and flossing.  You may urinate more often because the fetus is pressing on your bladder.  You may have back pain. This is caused by: ? Weight gain. ? Pregnancy hormones that are relaxing the joints in your pelvis. ? A shift in weight and the muscles that support your balance. Follow these instructions at home: Medicines  Follow your health care provider's instructions regarding medicine use. Specific medicines may be either safe or unsafe to take during pregnancy. Do not take any medicines unless approved by your health care provider.  Take a prenatal vitamin that contains at least 600 micrograms (mcg) of folic acid. Eating and drinking  Eat a healthy diet that includes fresh fruits and vegetables, whole grains, good sources of protein such as meat, eggs, or tofu, and low-fat dairy products.  Avoid raw meat and unpasteurized juice, milk, and cheese. These carry germs that can harm you and your baby.  You may need to take these actions to prevent or treat constipation: ? Drink enough fluid to keep your urine pale yellow. ? Eat foods that are high in fiber, such as beans, whole grains, and fresh fruits and  vegetables. ? Limit foods that are high in fat and processed sugars, such as fried or sweet foods. Activity  Exercise only as directed by your health care provider. Most people can continue their usual exercise routine during pregnancy. Try to exercise for 30 minutes at least 5 days a week. Stop exercising if you develop contractions in your uterus.  Stop exercising if you develop pain or cramping in the lower abdomen or lower back.  Avoid exercising if it is very hot or humid or if you are at a high altitude.  Avoid heavy lifting.  If you choose to, you may have   sex unless your health care provider tells you not to. Relieving pain and discomfort  Wear a supportive bra to prevent discomfort from breast tenderness.  Take warm sitz baths to soothe any pain or discomfort caused by hemorrhoids. Use hemorrhoid cream if your health care provider approves.  Rest with your legs raised (elevated) if you have leg cramps or low back pain.  If you develop varicose veins: ? Wear support hose as told by your health care provider. ? Elevate your feet for 15 minutes, 3-4 times a day. ? Limit salt in your diet. Safety  Wear your seat belt at all times when driving or riding in a car.  Talk with your health care provider if someone is verbally or physically abusive to you. Lifestyle  Do not use hot tubs, steam rooms, or saunas.  Do not douche. Do not use tampons or scented sanitary pads.  Avoid cat litter boxes and soil used by cats. These carry germs that can cause birth defects in the baby and possibly loss of the fetus by miscarriage or stillbirth.  Do not use herbal remedies, alcohol, illegal drugs, or medicines that are not approved by your health care provider. Chemicals in these products can harm your baby.  Do not use any products that contain nicotine or tobacco, such as cigarettes, e-cigarettes, and chewing tobacco. If you need help quitting, ask your health care provider. General  instructions  During a routine prenatal visit, your health care provider will do a physical exam and other tests. He or she will also discuss your overall health. Keep all follow-up visits. This is important.  Ask your health care provider for a referral to a local prenatal education class.  Ask for help if you have counseling or nutritional needs during pregnancy. Your health care provider can offer advice or refer you to specialists for help with various needs. Where to find more information  American Pregnancy Association: americanpregnancy.org  American College of Obstetricians and Gynecologists: acog.org/en/Womens%20Health/Pregnancy  Office on Women's Health: womenshealth.gov/pregnancy Contact a health care provider if you have:  A headache that does not go away when you take medicine.  Vision changes or you see spots in front of your eyes.  Mild pelvic cramps, pelvic pressure, or nagging pain in the abdominal area.  Persistent nausea, vomiting, or diarrhea.  A bad-smelling vaginal discharge or foul-smelling urine.  Pain when you urinate.  Sudden or extreme swelling of your face, hands, ankles, feet, or legs.  A fever. Get help right away if you:  Have fluid leaking from your vagina.  Have spotting or bleeding from your vagina.  Have severe abdominal cramping or pain.  Have difficulty breathing.  Have chest pain.  Have fainting spells.  Have not felt your baby move for the time period told by your health care provider.  Have new or increased pain, swelling, or redness in an arm or leg. Summary  The second trimester of pregnancy is from week 13 through week 27 (months 4 through 6).  Do not use herbal remedies, alcohol, illegal drugs, or medicines that are not approved by your health care provider. Chemicals in these products can harm your baby.  Exercise only as directed by your health care provider. Most people can continue their usual exercise routine  during pregnancy.  Keep all follow-up visits. This is important. This information is not intended to replace advice given to you by your health care provider. Make sure you discuss any questions you have with your health care   provider. Document Revised: 07/25/2019 Document Reviewed: 05/31/2019 Else[employer] Patient Education  2021 [employer]   Contraception Choices Contraception, also called birth control, refers to methods or devices that prevent pregnancy. Hormonal methods Contraceptive implant A contraceptive implant is a thin, plastic tube that contains a hormone that prevents pregnancy. It is different from an intrauterine device (IUD). It is inserted into the upper part of the arm by a health care provider. Implants can be effective for up to 3 years. Progestin-only injections Progestin-only injections are injections of progestin, a synthetic form of the hormone progesterone. They are given every 3 months by a health care provider. Birth control pills Birth control pills are pills that contain hormones that prevent pregnancy. They must be taken once a day, preferably at the same time each day. A prescription is needed to use this method of contraception. Birth control patch The birth control patch contains hormones that prevent pregnancy. It is placed on the skin and must be changed once a week for three weeks and removed on the fourth week. A prescription is needed to use this method of contraception. Vaginal ring A vaginal ring contains hormones that prevent pregnancy. It is placed in the vagina for three weeks and removed on the fourth week. After that, the process is repeated with a new ring. A prescription is needed to use this method of contraception. Emergency contraceptive Emergency contraceptives prevent pregnancy after unprotected sex. They come in pill form and can be taken up to 5 days after sex. They work best the sooner they are taken after having sex. Most emergency  contraceptives are available without a prescription. This method should not be used as your only form of birth control.   Barrier methods Female condom A female condom is a thin sheath that is worn over the penis during sex. Condoms keep sperm from going inside a woman's body. They can be used with a sperm-killing substance (spermicide) to increase their effectiveness. They should be thrown away after one use. Female condom A female condom is a soft, loose-fitting sheath that is put into the vagina before sex. The condom keeps sperm from going inside a woman's body. They should be thrown away after one use. Diaphragm A diaphragm is a soft, dome-shaped barrier. It is inserted into the vagina before sex, along with a spermicide. The diaphragm blocks sperm from entering the uterus, and the spermicide kills sperm. A diaphragm should be left in the vagina for 6-8 hours after sex and removed within 24 hours. A diaphragm is prescribed and fitted by a health care provider. A diaphragm should be replaced every 1-2 years, after giving birth, after gaining more than 15 lb (6.8 kg), and after pelvic surgery. Cervical cap A cervical cap is a round, soft latex or plastic cup that fits over the cervix. It is inserted into the vagina before sex, along with spermicide. It blocks sperm from entering the uterus. The cap should be left in place for 6-8 hours after sex and removed within 48 hours. A cervical cap must be prescribed and fitted by a health care provider. It should be replaced every 2 years. Sponge A sponge is a soft, circular piece of polyurethane foam with spermicide in it. The sponge helps block sperm from entering the uterus, and the spermicide kills sperm. To use it, you make it wet and then insert it into the vagina. It should be inserted before sex, left in for at least 6 hours after sex, and removed and thrown away   within 30 hours. Spermicides Spermicides are chemicals that kill or block sperm from  entering the cervix and uterus. They can come as a cream, jelly, suppository, foam, or tablet. A spermicide should be inserted into the vagina with an applicator at least 10-15 minutes before sex to allow time for it to work. The process must be repeated every time you have sex. Spermicides do not require a prescription.   Intrauterine contraception Intrauterine device (IUD) An IUD is a T-shaped device that is put in a woman's uterus. There are two types:  Hormone IUD.This type contains progestin, a synthetic form of the hormone progesterone. This type can stay in place for 3-5 years.  Copper IUD.This type is wrapped in copper wire. It can stay in place for 10 years. Permanent methods of contraception Female tubal ligation In this method, a woman's fallopian tubes are sealed, tied, or blocked during surgery to prevent eggs from traveling to the uterus. Hysteroscopic sterilization In this method, a small, flexible insert is placed into each fallopian tube. The inserts cause scar tissue to form in the fallopian tubes and block them, so sperm cannot reach an egg. The procedure takes about 3 months to be effective. Another form of birth control must be used during those 3 months. Female sterilization This is a procedure to tie off the tubes that carry sperm (vasectomy). After the procedure, the man can still ejaculate fluid (semen). Another form of birth control must be used for 3 months after the procedure. Natural planning methods Natural family planning In this method, a couple does not have sex on days when the woman could become pregnant. Calendar method In this method, the woman keeps track of the length of each menstrual cycle, identifies the days when pregnancy can happen, and does not have sex on those days. Ovulation method In this method, a couple avoids sex during ovulation. Symptothermal method This method involves not having sex during ovulation. The woman typically checks for  ovulation by watching changes in her temperature and in the consistency of cervical mucus. Post-ovulation method In this method, a couple waits to have sex until after ovulation. Where to find more information  Centers for Disease Control and Prevention: www.cdc.gov Summary  Contraception, also called birth control, refers to methods or devices that prevent pregnancy.  Hormonal methods of contraception include implants, injections, pills, patches, vaginal rings, and emergency contraceptives.  Barrier methods of contraception can include female condoms, female condoms, diaphragms, cervical caps, sponges, and spermicides.  There are two types of IUDs (intrauterine devices). An IUD can be put in a woman's uterus to prevent pregnancy for 3-5 years.  Permanent sterilization can be done through a procedure for males and females. Natural family planning methods involve nothaving sex on days when the woman could become pregnant. This information is not intended to replace advice given to you by your health care provider. Make sure you discuss any questions you have with your health care provider. Document Revised: 07/23/2019 Document Reviewed: 07/23/2019 Elsevier Patient Education  2021 [employer].   Breastfeeding  Choosing to breastfeed is one of the best decisions you can make for yourself and your baby. A change in hormones during pregnancy causes your breasts to make breast milk in your milk-producing glands. Hormones prevent breast milk from being released before your baby is born. They also prompt milk flow after birth. Once breastfeeding has begun, thoughts of your baby, as well as his or her sucking or crying, can stimulate the release of milk   from your milk-producing glands. Benefits of breastfeeding Research shows that breastfeeding offers many health benefits for infants and mothers. It also offers a cost-free and convenient way to feed your baby. For your baby  Your first milk  (colostrum) helps your baby's digestive system to function better.  Special cells in your milk (antibodies) help your baby to fight off infections.  Breastfed babies are less likely to develop asthma, allergies, obesity, or type 2 diabetes. They are also at lower risk for sudden infant death syndrome (SIDS).  Nutrients in breast milk are better able to meet your baby's needs compared to infant formula.  Breast milk improves your baby's brain development. For you  Breastfeeding helps to create a very special bond between you and your baby.  Breastfeeding is convenient. Breast milk costs nothing and is always available at the correct temperature.  Breastfeeding helps to burn calories. It helps you to lose the weight that you gained during pregnancy.  Breastfeeding makes your uterus return faster to its size before pregnancy. It also slows bleeding (lochia) after you give birth.  Breastfeeding helps to lower your risk of developing type 2 diabetes, osteoporosis, rheumatoid arthritis, cardiovascular disease, and breast, ovarian, uterine, and endometrial cancer later in life. Breastfeeding basics Starting breastfeeding  Find a comfortable place to sit or lie down, with your neck and back well-supported.  Place a pillow or a rolled-up blanket under your baby to bring him or her to the level of your breast (if you are seated). Nursing pillows are specially designed to help support your arms and your baby while you breastfeed.  Make sure that your baby's tummy (abdomen) is facing your abdomen.  Gently massage your breast. With your fingertips, massage from the outer edges of your breast inward toward the nipple. This encourages milk flow. If your milk flows slowly, you may need to continue this action during the feeding.  Support your breast with 4 fingers underneath and your thumb above your nipple (make the letter "C" with your hand). Make sure your fingers are well away from your nipple and  your baby's mouth.  Stroke your baby's lips gently with your finger or nipple.  When your baby's mouth is open wide enough, quickly bring your baby to your breast, placing your entire nipple and as much of the areola as possible into your baby's mouth. The areola is the colored area around your nipple. ? More areola should be visible above your baby's upper lip than below the lower lip. ? Your baby's lips should be opened and extended outward (flanged) to ensure an adequate, comfortable latch. ? Your baby's tongue should be between his or her lower gum and your breast.  Make sure that your baby's mouth is correctly positioned around your nipple (latched). Your baby's lips should create a seal on your breast and be turned out (everted).  It is common for your baby to suck about 2-3 minutes in order to start the flow of breast milk. Latching Teaching your baby how to latch onto your breast properly is very important. An improper latch can cause nipple pain, decreased milk supply, and poor weight gain in your baby. Also, if your baby is not latched onto your nipple properly, he or she may swallow some air during feeding. This can make your baby fussy. Burping your baby when you switch breasts during the feeding can help to get rid of the air. However, teaching your baby to latch on properly is still the best way to   prevent fussiness from swallowing air while breastfeeding. Signs that your baby has successfully latched onto your nipple  Silent tugging or silent sucking, without causing you pain. Infant's lips should be extended outward (flanged).  Swallowing heard between every 3-4 sucks once your milk has started to flow (after your let-down milk reflex occurs).  Muscle movement above and in front of his or her ears while sucking. Signs that your baby has not successfully latched onto your nipple  Sucking sounds or smacking sounds from your baby while breastfeeding.  Nipple pain. If you think  your baby has not latched on correctly, slip your finger into the corner of your baby's mouth to break the suction and place it between your baby's gums. Attempt to start breastfeeding again. Signs of successful breastfeeding Signs from your baby  Your baby will gradually decrease the number of sucks or will completely stop sucking.  Your baby will fall asleep.  Your baby's body will relax.  Your baby will retain a small amount of milk in his or her mouth.  Your baby will let go of your breast by himself or herself. Signs from you  Breasts that have increased in firmness, weight, and size 1-3 hours after feeding.  Breasts that are softer immediately after breastfeeding.  Increased milk volume, as well as a change in milk consistency and color by the fifth day of breastfeeding.  Nipples that are not sore, cracked, or bleeding. Signs that your baby is getting enough milk  Wetting at least 1-2 diapers during the first 24 hours after birth.  Wetting at least 5-6 diapers every 24 hours for the first week after birth. The urine should be clear or pale yellow by the age of 5 days.  Wetting 6-8 diapers every 24 hours as your baby continues to grow and develop.  At least 3 stools in a 24-hour period by the age of 5 days. The stool should be soft and yellow.  At least 3 stools in a 24-hour period by the age of 7 days. The stool should be seedy and yellow.  No loss of weight greater than 10% of birth weight during the first 3 days of life.  Average weight gain of 4-7 oz (113-198 g) per week after the age of 4 days.  Consistent daily weight gain by the age of 5 days, without weight loss after the age of 2 weeks. After a feeding, your baby may spit up a small amount of milk. This is normal. Breastfeeding frequency and duration Frequent feeding will help you make more milk and can prevent sore nipples and extremely full breasts (breast engorgement). Breastfeed when you feel the need to  reduce the fullness of your breasts or when your baby shows signs of hunger. This is called "breastfeeding on demand." Signs that your baby is hungry include:  Increased alertness, activity, or restlessness.  Movement of the head from side to side.  Opening of the mouth when the corner of the mouth or cheek is stroked (rooting).  Increased sucking sounds, smacking lips, cooing, sighing, or squeaking.  Hand-to-mouth movements and sucking on fingers or hands.  Fussing or crying. Avoid introducing a pacifier to your baby in the first 4-6 weeks after your baby is born. After this time, you may choose to use a pacifier. Research has shown that pacifier use during the first year of a baby's life decreases the risk of sudden infant death syndrome (SIDS). Allow your baby to feed on each breast as long as   he or she wants. When your baby unlatches or falls asleep while feeding from the first breast, offer the second breast. Because newborns are often sleepy in the first few weeks of life, you may need to awaken your baby to get him or her to feed. Breastfeeding times will vary from baby to baby. However, the following rules can serve as a guide to help you make sure that your baby is properly fed:  Newborns (babies 4 weeks of age or younger) may breastfeed every 1-3 hours.  Newborns should not go without breastfeeding for longer than 3 hours during the day or 5 hours during the night.  You should breastfeed your baby a minimum of 8 times in a 24-hour period. Breast milk pumping Pumping and storing breast milk allows you to make sure that your baby is exclusively fed your breast milk, even at times when you are unable to breastfeed. This is especially important if you go back to work while you are still breastfeeding, or if you are not able to be present during feedings. Your lactation consultant can help you find a method of pumping that works best for you and give you guidelines about how long it is  safe to store breast milk.      Caring for your breasts while you breastfeed Nipples can become dry, cracked, and sore while breastfeeding. The following recommendations can help keep your breasts moisturized and healthy:  Avoid using soap on your nipples.  Wear a supportive bra designed especially for nursing. Avoid wearing underwire-style bras or extremely tight bras (sports bras).  Air-dry your nipples for 3-4 minutes after each feeding.  Use only cotton bra pads to absorb leaked breast milk. Leaking of breast milk between feedings is normal.  Use lanolin on your nipples after breastfeeding. Lanolin helps to maintain your skin's normal moisture barrier. Pure lanolin is not harmful (not toxic) to your baby. You may also hand express a few drops of breast milk and gently massage that milk into your nipples and allow the milk to air-dry. In the first few weeks after giving birth, some women experience breast engorgement. Engorgement can make your breasts feel heavy, warm, and tender to the touch. Engorgement peaks within 3-5 days after you give birth. The following recommendations can help to ease engorgement:  Completely empty your breasts while breastfeeding or pumping. You may want to start by applying warm, moist heat (in the shower or with warm, water-soaked hand towels) just before feeding or pumping. This increases circulation and helps the milk flow. If your baby does not completely empty your breasts while breastfeeding, pump any extra milk after he or she is finished.  Apply ice packs to your breasts immediately after breastfeeding or pumping, unless this is too uncomfortable for you. To do this: ? Put ice in a plastic bag. ? Place a towel between your skin and the bag. ? Leave the ice on for 20 minutes, 2-3 times a day.  Make sure that your baby is latched on and positioned properly while breastfeeding. If engorgement persists after 48 hours of following these recommendations,  contact your health care provider or a lactation consultant. Overall health care recommendations while breastfeeding  Eat 3 healthy meals and 3 snacks every day. Well-nourished mothers who are breastfeeding need an additional 450-500 calories a day. You can meet this requirement by increasing the amount of a balanced diet that you eat.  Drink enough water to keep your urine pale yellow or clear.  Rest   often, relax, and continue to take your prenatal vitamins to prevent fatigue, stress, and low vitamin and mineral levels in your body (nutrient deficiencies).  Do not use any products that contain nicotine or tobacco, such as cigarettes and e-cigarettes. Your baby may be harmed by chemicals from cigarettes that pass into breast milk and exposure to secondhand smoke. If you need help quitting, ask your health care provider.  Avoid alcohol.  Do not use illegal drugs or marijuana.  Talk with your health care provider before taking any medicines. These include over-the-counter and prescription medicines as well as vitamins and herbal supplements. Some medicines that may be harmful to your baby can pass through breast milk.  It is possible to become pregnant while breastfeeding. If birth control is desired, ask your health care provider about options that will be safe while breastfeeding your baby. Where to find more information: [employer]: www.llli.org Contact a health care provider if:  You feel like you want to stop breastfeeding or have become frustrated with breastfeeding.  Your nipples are cracked or bleeding.  Your breasts are red, tender, or warm.  You have: ? Painful breasts or nipples. ? A swollen area on either breast. ? A fever or chills. ? Nausea or vomiting. ? Drainage other than breast milk from your nipples.  Your breasts do not become full before feedings by the fifth day after you give birth.  You feel sad and depressed.  Your baby is: ? Too  sleepy to eat well. ? Having trouble sleeping. ? More than 1 week old and wetting fewer than 6 diapers in a 24-hour period. ? Not gaining weight by 5 days of age.  Your baby has fewer than 3 stools in a 24-hour period.  Your baby's skin or the white parts of his or her eyes become yellow. Get help right away if:  Your baby is overly tired (lethargic) and does not want to wake up and feed.  Your baby develops an unexplained fever. Summary  Breastfeeding offers many health benefits for infant and mothers.  Try to breastfeed your infant when he or she shows early signs of hunger.  Gently tickle or stroke your baby's lips with your finger or nipple to allow the baby to open his or her mouth. Bring the baby to your breast. Make sure that much of the areola is in your baby's mouth. Offer one side and burp the baby before you offer the other side.  Talk with your health care provider or lactation consultant if you have questions or you face problems as you breastfeed. This information is not intended to replace advice given to you by your health care provider. Make sure you discuss any questions you have with your health care provider. Document Revised: 05/12/2017 Document Reviewed: 03/19/2016 Elsevier Patient Education  2021 Elsevier Inc.  

## 2020-06-09 NOTE — Addendum Note (Signed)
Addended by: Cheree Ditto, Sonia Bromell A on: 06/09/2020 02:05 PM   Modules accepted: Orders

## 2020-06-09 NOTE — Progress Notes (Signed)
Subjective:    Karen Mcmahon is a [id number] [redacted]w[redacted]d being seen today for her first obstetrical visit.  Her obstetrical history is significant for obesity. Patient does intend to breast feed. Pregnancy history fully reviewed.  Patient reports no complaints.  Vitals:   06/09/20 0819  BP: 113/74  Pulse: 92  Weight: 197 lb (89.4 kg)    HISTORY: OB History  Gravida Para Term Preterm AB Living  4 1 1   2 1   SAB IAB Ectopic Multiple Live Births  1 1     1     # Outcome Date GA Lbr Len/2nd Weight Sex Delivery Anes PTL Lv  4 Current           3 Term 03/02/12 [redacted]w[redacted]d 32:38 / 00:33 8 lb 13.6 oz (4.015 kg) M Vag-Spont EPI  LIV     Birth Comments: caput  2 SAB 2011          1 IAB 2010 [redacted]w[redacted]d          Past Medical History:  Diagnosis Date  . Arthritis   . Hx of gonorrhea   . Keloid    Left Upper Lateral Earlobe  . Pregnant    hx  . Trichomonas contact, treated    Past Surgical History:  Procedure Laterality Date  . EAR CYST EXCISION Left 04/05/2014   Procedure: EXCISION EAR CYST;  Surgeon: [redacted]w[redacted]d, MD;  Location: [facility] OR;  Service: ENT;  Laterality: Left;  Excision of keloid  . EXTERNAL EAR SURGERY Left 12   Family History  Problem Relation Age of Onset  . Diabetes Mother   . Hypertension Mother   . Pancreatic cancer Father   . Diabetes Maternal Grandmother   . Hypertension Maternal Grandmother   . Other Neg Hx      Exam    Uterus:     Pelvic Exam:    Perineum: Normal Perineum   Vulva: normal   Vagina:  normal mucosa, normal discharge   pH:    Cervix: multiparous appearance and cervix is closed and long   Adnexa: no mass, fullness, tenderness   Bony Pelvis: gynecoid  System: Breast:  normal appearance, no masses or tenderness   Skin: normal coloration and turgor, no rashes    Neurologic: oriented, no focal deficits   Extremities: normal strength, tone, and muscle mass   HEENT extra ocular movement intact   Mouth/Teeth mucous membranes moist, pharynx normal  without lesions and dental hygiene good   Neck no masses   Cardiovascular: regular rate and rhythm   Respiratory:  appears well, vitals normal, no respiratory distress, acyanotic, normal RR, neck free of mass or lymphadenopathy, chest clear, no wheezing, crepitations, rhonchi, normal symmetric air entry   Abdomen: soft, non-tender; bowel sounds normal; no masses,  no organomegaly   Urinary:       Assessment:    Pregnancy: Melvenia Beam Patient Active Problem List   Diagnosis Date Noted  . Maternal obesity affecting pregnancy, antepartum 06/09/2020  . Encounter for supervision of normal pregnancy in first trimester 05/28/2020  . History of recurrent vaginal discharge 11/30/2018  . Left arm pain 11/15/2018  . Keloid scar of skin 03/22/2012        Plan:     Initial labs drawn. Prenatal vitamins. Problem list reviewed and updated. Genetic Screening discussed : panorama ordered.  Ultrasound discussed; fetal survey: ordered.  Follow up in 4 weeks. 50% of 30 min visit spent on counseling and coordination of care.  Tonya Carlile 06/09/2020

## 2020-06-10 LAB — CBC/D/PLT+RPR+RH+ABO+RUB AB...
Antibody Screen: NEGATIVE
Basophils Absolute: 0 10*3/uL (ref 0.0–0.2)
Basos: 0 %
EOS (ABSOLUTE): 0.3 10*3/uL (ref 0.0–0.4)
Eos: 5 %
HCV Ab: <0.1 s/co ratio (ref 0.0–0.9)
HIV Screen 4th Generation wRfx: NONREACTIVE
Hematocrit: 39.7 % (ref 34.0–46.6)
Hemoglobin: 13.3 g/dL (ref 11.1–15.9)
Hepatitis B Surface Ag: NEGATIVE
Immature Grans (Abs): 0 10*3/uL (ref 0.0–0.1)
Immature Granulocytes: 0 %
Lymphocytes Absolute: 1.6 10*3/uL (ref 0.7–3.1)
Lymphs: 23 %
MCH: 29 pg (ref 26.6–33.0)
MCHC: 33.5 g/dL (ref 31.5–35.7)
MCV: 87 fL (ref 79–97)
Monocytes Absolute: 0.7 10*3/uL (ref 0.1–0.9)
Monocytes: 10 %
Neutrophils Absolute: 4.2 10*3/uL (ref 1.4–7.0)
Neutrophils: 62 %
Platelets: 234 10*3/uL (ref 150–450)
RBC: 4.59 x10E6/uL (ref 3.77–5.28)
RDW: 13.9 % (ref 11.7–15.4)
RPR Ser Ql: NONREACTIVE
Rh Factor: POSITIVE
Rubella Antibodies, IGG: 1.63 index (ref >0.99)
WBC: 6.8 10*3/uL (ref 3.4–10.8)

## 2020-06-10 LAB — HEMOGLOBIN A1C
Est. average glucose Bld gHb Est-mCnc: 100 mg/dL
Hgb A1c MFr Bld: 5.1 % (ref 4.8–5.6)

## 2020-06-10 LAB — CERVICOVAGINAL ANCILLARY ONLY
Bacterial Vaginitis (gardnerella): NEGATIVE
Candida Glabrata: NEGATIVE
Candida Vaginitis: POSITIVE — AB
Chlamydia: NEGATIVE
Comment: NEGATIVE
Comment: NEGATIVE
Comment: NEGATIVE
Comment: NEGATIVE
Comment: NEGATIVE
Comment: NORMAL
Neisseria Gonorrhea: NEGATIVE
Trichomonas: NEGATIVE

## 2020-06-10 LAB — HCV INTERPRETATION

## 2020-06-10 MED ORDER — TERCONAZOLE 0.8 % VA CREA: 1.0000 | TOPICAL_CREAM | Freq: Every day | VAGINAL | 0 refills | Status: DC

## 2020-06-10 NOTE — Addendum Note (Signed)
Addended by: Catalina Antigua on: 06/10/2020 03:21 PM   Modules accepted: Orders

## 2020-06-11 LAB — CULTURE, OB URINE

## 2020-06-11 LAB — URINE CULTURE, OB REFLEX

## 2020-06-17 ENCOUNTER — Other Ambulatory Visit: Payer: Self-pay

## 2020-06-17 ENCOUNTER — Encounter: Payer: Self-pay | Admitting: Obstetrics and Gynecology

## 2020-06-19 ENCOUNTER — Encounter: Payer: Self-pay | Admitting: *Deleted

## 2020-06-24 ENCOUNTER — Encounter: Payer: Self-pay | Admitting: Obstetrics and Gynecology

## 2020-06-24 DIAGNOSIS — D563 Thalassemia minor: Secondary | ICD-10-CM | POA: Insufficient documentation

## 2020-07-07 ENCOUNTER — Encounter: Payer: Self-pay | Admitting: Obstetrics and Gynecology

## 2020-07-07 ENCOUNTER — Ambulatory Visit (INDEPENDENT_AMBULATORY_CARE_PROVIDER_SITE_OTHER): Payer: Medicaid Other | Admitting: Obstetrics and Gynecology

## 2020-07-07 ENCOUNTER — Other Ambulatory Visit: Payer: Self-pay

## 2020-07-07 VITALS — BP 111/70 | HR 86 | Wt 210.4 lb

## 2020-07-07 DIAGNOSIS — O99210 Obesity complicating pregnancy, unspecified trimester: Secondary | ICD-10-CM

## 2020-07-07 DIAGNOSIS — D563 Thalassemia minor: Secondary | ICD-10-CM

## 2020-07-07 DIAGNOSIS — Z3481 Encounter for supervision of other normal pregnancy, first trimester: Secondary | ICD-10-CM

## 2020-07-07 NOTE — Progress Notes (Signed)
Patient presents for ROB. Patient has no concerns today. 

## 2020-07-07 NOTE — Progress Notes (Signed)
   PRENATAL VISIT NOTE  Subjective:  Karen Mcmahon is a 30 y.o. [id number] at [redacted]w[redacted]d being seen today for ongoing prenatal care.  She is currently monitored for the following issues for this high-risk pregnancy and has Keloid scar of skin; Left arm pain; History of recurrent vaginal discharge; Encounter for supervision of normal pregnancy in first trimester; Maternal obesity affecting pregnancy, antepartum; and Alpha thalassemia silent carrier on their problem list.  Patient reports no complaints.  Contractions: Not present. Vag. Bleeding: None.  Movement: Present. Denies leaking of fluid.   The following portions of the patient's history were reviewed and updated as appropriate: allergies, current medications, past family history, past medical history, past social history, past surgical history and problem list.   Objective:   Vitals:   07/07/20 1519  BP: 111/70  Pulse: 86  Weight: 210 lb 6.4 oz (95.4 kg)    Fetal Status:     Movement: Present     General:  Alert, oriented and cooperative. Patient is in no acute distress.  Skin: Skin is warm and dry. No rash noted.   Cardiovascular: Normal heart rate noted  Respiratory: Normal respiratory effort, no problems with respiration noted  Abdomen: Soft, gravid, appropriate for gestational age.  Pain/Pressure: Absent     Pelvic: Cervical exam deferred        Extremities: Normal range of motion.  Edema: None  Mental Status: Normal mood and affect. Normal behavior. Normal judgment and thought content.   Assessment and Plan:  Pregnancy: [id number] at [redacted]w[redacted]d 1. Encounter for supervision of other normal pregnancy in first trimester Patient is doing well without complaints AFP next visit Anatomy scheduled  2. Alpha thalassemia silent carrier Follow up genetic counseling  3. Maternal obesity affecting pregnancy, antepartum Continue ASA   Preterm labor symptoms and general obstetric precautions including but not limited to vaginal bleeding,  contractions, leaking of fluid and fetal movement were reviewed in detail with the patient. Please refer to After Visit Summary for other counseling recommendations.   Return in about 4 weeks (around 08/04/2020) for in person, ROB, Low risk.  Future Appointments  Date Time Provider Department Center  07/07/2020  3:30 PM Anaira Seay, Gigi Gin, MD [facility] None  08/11/2020 10:15 AM [facility]    Catalina Antigua, MD

## 2020-07-14 ENCOUNTER — Telehealth: Payer: Self-pay

## 2020-07-14 NOTE — Telephone Encounter (Signed)
Patient called stating that she is experiencing a vaginal odor. States that it does not smell fishy. She denies having any discharge or vaginal irritation. She states that sx has been present since the beginning of her pregnancy. Patient scheduled for nurse visit self swab.

## 2020-07-17 ENCOUNTER — Other Ambulatory Visit: Payer: Self-pay

## 2020-07-17 ENCOUNTER — Ambulatory Visit: Payer: Medicaid Other

## 2020-07-17 ENCOUNTER — Other Ambulatory Visit (HOSPITAL_COMMUNITY)
Admission: RE | Admit: 2020-07-17 | Discharge: 2020-07-17 | Disposition: A | Discharge status: Home / Self Care | Payer: Medicaid Other | Source: Ambulatory Visit | Attending: Obstetrics | Admitting: Obstetrics

## 2020-07-17 VITALS — BP 113/77 | HR 75

## 2020-07-17 DIAGNOSIS — N898 Other specified noninflammatory disorders of vagina: Secondary | ICD-10-CM | POA: Insufficient documentation

## 2020-07-17 NOTE — Progress Notes (Signed)
SUBJECTIVE:  30 y.o. female complains of  vag discharge for a couple of days. Denies abnormal vaginal bleeding or significant pelvic pain or fever. No UTI symptoms. Denies history of known exposure to STD.  Patient's last menstrual period was 03/29/2020.  OBJECTIVE:  She appears well, afebrile. Urine dipstick: none   ASSESSMENT:  Vaginal Discharge: yes   Vaginal Odor:none    PLAN:  GC, chlamydia, trichomonas, BVAG, CVAG probe sent to lab. Treatment: To be determined once lab results are received ROV prn if symptoms persist or worsen.

## 2020-07-19 ENCOUNTER — Other Ambulatory Visit: Payer: Self-pay

## 2020-07-19 ENCOUNTER — Emergency Department (HOSPITAL_BASED_OUTPATIENT_CLINIC_OR_DEPARTMENT_OTHER): Payer: Medicaid Other

## 2020-07-19 ENCOUNTER — Encounter (HOSPITAL_BASED_OUTPATIENT_CLINIC_OR_DEPARTMENT_OTHER): Payer: Self-pay | Admitting: *Deleted

## 2020-07-19 ENCOUNTER — Emergency Department (HOSPITAL_BASED_OUTPATIENT_CLINIC_OR_DEPARTMENT_OTHER)
Admission: EM | Admit: 2020-07-19 | Discharge: 2020-07-19 | Disposition: A | Discharge status: Home / Self Care | Payer: Medicaid Other | Attending: Emergency Medicine | Admitting: Emergency Medicine

## 2020-07-19 DIAGNOSIS — O26892 Other specified pregnancy related conditions, second trimester: Secondary | ICD-10-CM | POA: Diagnosis not present

## 2020-07-19 DIAGNOSIS — Z3A16 16 weeks gestation of pregnancy: Secondary | ICD-10-CM | POA: Diagnosis not present

## 2020-07-19 DIAGNOSIS — Z87891 Personal history of nicotine dependence: Secondary | ICD-10-CM | POA: Diagnosis not present

## 2020-07-19 DIAGNOSIS — Z7982 Long term (current) use of aspirin: Secondary | ICD-10-CM | POA: Diagnosis not present

## 2020-07-19 DIAGNOSIS — O26899 Other specified pregnancy related conditions, unspecified trimester: Secondary | ICD-10-CM

## 2020-07-19 LAB — BASIC METABOLIC PANEL
Anion gap: 8 (ref 5–15)
BUN: 13 mg/dL (ref 6–20)
CO2: 20 mmol/L — ABNORMAL LOW (ref 22–32)
Calcium: 8.7 mg/dL — ABNORMAL LOW (ref 8.9–10.3)
Chloride: 105 mmol/L (ref 98–111)
Creatinine, Ser: 0.59 mg/dL (ref 0.44–1.00)
GFR, Estimated: >60 mL/min (ref >60)
Glucose, Bld: 80 mg/dL (ref 70–99)
Potassium: 3.5 mmol/L (ref 3.5–5.1)
Sodium: 133 mmol/L — ABNORMAL LOW (ref 135–145)

## 2020-07-19 LAB — CBC WITH DIFFERENTIAL/PLATELET
Abs Immature Granulocytes: 0.15 10*3/uL — ABNORMAL HIGH (ref 0.00–0.07)
Basophils Absolute: 0 10*3/uL (ref 0.0–0.1)
Basophils Relative: 0 %
Eosinophils Absolute: 0.3 10*3/uL (ref 0.0–0.5)
Eosinophils Relative: 3 %
HCT: 35.3 % — ABNORMAL LOW (ref 36.0–46.0)
Hemoglobin: 12.2 g/dL (ref 12.0–15.0)
Immature Granulocytes: 2 %
Lymphocytes Relative: 20 %
Lymphs Abs: 1.8 10*3/uL (ref 0.7–4.0)
MCH: 30.3 pg (ref 26.0–34.0)
MCHC: 34.6 g/dL (ref 30.0–36.0)
MCV: 87.6 fL (ref 80.0–100.0)
Monocytes Absolute: 1 10*3/uL (ref 0.1–1.0)
Monocytes Relative: 11 %
Neutro Abs: 5.9 10*3/uL (ref 1.7–7.7)
Neutrophils Relative %: 64 %
Platelets: 207 10*3/uL (ref 150–400)
RBC: 4.03 MIL/uL (ref 3.87–5.11)
RDW: 15 % (ref 11.5–15.5)
WBC: 9.2 10*3/uL (ref 4.0–10.5)
nRBC: 0 % (ref 0.0–0.2)

## 2020-07-19 LAB — URINALYSIS, ROUTINE W REFLEX MICROSCOPIC
Bilirubin Urine: NEGATIVE
Glucose, UA: NEGATIVE mg/dL
Hgb urine dipstick: NEGATIVE
Ketones, ur: NEGATIVE mg/dL
Leukocytes,Ua: NEGATIVE
Nitrite: NEGATIVE
Protein, ur: NEGATIVE mg/dL
Specific Gravity, Urine: 1.015 (ref 1.005–1.030)
pH: 6 (ref 5.0–8.0)

## 2020-07-19 LAB — WET PREP, GENITAL
Sperm: NONE SEEN
Trich, Wet Prep: NONE SEEN
Yeast Wet Prep HPF POC: NONE SEEN

## 2020-07-19 LAB — HCG, QUANTITATIVE, PREGNANCY: hCG, Beta Chain, Quant, S: 23102 m[IU]/mL — ABNORMAL HIGH (ref <5)

## 2020-07-19 NOTE — Discharge Instructions (Addendum)
You were seen in the emergency room today for your abdominal pressure.  Your physical exam, vital signs, blood work, and ultrasound are very reassuring.  Your baby is measuring 16 weeks and 3 days on ultrasound today.  This was not a diagnostic anatomy ultrasound, please follow-up with your OB/GYN as previously scheduled.  While the exact cause of your abdominal pressure remains unclear, there does not appear to be any emergent source for your symptoms at this time.  There are no signs of infection in your urine either.  You were swabbed today to look for bacterial vaginosis and vaginal yeast.  You requested to be discharged prior to those results.  You may follow the results in the MyChart app.  Please discuss the results with your OB/GYN at your next visit.  I would recommend to call them to be seen in the next week.  Return to the emergency department with any worsening abdominal pain, nausea does not stop, decreased fetal movement, vaginal bleeding or discharge, or any other new severe symptoms.

## 2020-07-19 NOTE — ED Provider Notes (Signed)
[facility] EMERGENCY DEPARTMENT Provider Note   CSN: [id number] Arrival date & time: 07/19/20  1855     History Chief Complaint  Patient presents with  . Abdominal Pain    16 wks preg    Karen Mcmahon is a 30 y.o. female G2, P1 currently 16-weeks pregnant who presents with concern for 2 weeks of progressively worsening lower abdominal pressure and urinary urgency.  In addition she has foul vaginal odor but denies any vaginal bleeding or discharge.  She is beginning to feel the fetus move and states that this has been consistent for the last week.  She denies any fevers or chills, nausea, vomiting at home, denies any back pain.  Patient is followed with [facility].  States that she went in for a self swab this past week for foul vaginal odor but did not perform a urine test and has not heard results of the self swab.  A BRH in the system is a be positive, 05/2020.  I personally reviewed this patient's medical records.  She has history of alpha thalassemia silent carrier, and history of sexually transmitted infections in the past.   HPI     Past Medical History:  Diagnosis Date  . Arthritis   . Hx of gonorrhea   . Keloid    Left Upper Lateral [place]  . Pregnant    hx  . Trichomonas contact, treated     Patient Active Problem List   Diagnosis Date Noted  . Alpha thalassemia silent carrier 06/24/2020  . Maternal obesity affecting pregnancy, antepartum 06/09/2020  . Encounter for supervision of normal pregnancy in first trimester 05/28/2020  . History of recurrent vaginal discharge 11/30/2018  . Left arm pain 11/15/2018  . Keloid scar of skin 03/22/2012    Past Surgical History:  Procedure Laterality Date  . EAR CYST EXCISION Left 04/05/2014   Procedure: EXCISION EAR CYST;  Surgeon: Melvenia Beam, MD;  Location: [facility] OR;  Service: ENT;  Laterality: Left;  Excision of keloid  . EXTERNAL EAR SURGERY Left 12     OB History    Gravida  4   Para  1   Term   1   Preterm      AB  2   Living  1     SAB  1   IAB  1   Ectopic      Multiple      Live Births  1           Family History  Problem Relation Age of Onset  . Diabetes Mother   . Hypertension Mother   . Pancreatic cancer Father   . Diabetes Maternal Grandmother   . Hypertension Maternal Grandmother   . Other Neg Hx     Social History   Tobacco Use  . Smoking status: Former Smoker    Packs/day: 0.50    Years: 2.00    Pack years: 1.00    Types: Cigarettes    Quit date: 03/18/2014    Years since quitting: 6.3  . Smokeless tobacco: Never Used  Vaping Use  . Vaping Use: Never used  Substance Use Topics  . Alcohol use: Not Currently    Comment: 1 mixed drink daily. Not since confirmed pregnancy  . Drug use: Not Currently    Types: Marijuana    Comment: 1 monthlast. Not since confirmed pregnancy    Home Medications Prior to Admission medications   Medication Sig Start Date End Date Taking? Authorizing  Provider  aspirin EC 81 MG tablet Take 1 tablet (81 mg total) by mouth daily. Take after 12 weeks for prevention of preeclampsia later in pregnancy 06/09/20  Yes Constant, Peggy, MD  Prenat-Fe Carbonyl-FA-Omega 3 (ONE-A-DAY WOMENS PRENATAL 1) 28-0.8-235 MG CAPS Take by mouth.   Yes [provider]  Prenatal Vit-Fe Fumarate-FA (PREPLUS) 27-1 MG TABS Take 1 tablet by mouth daily. 06/09/20  Yes Constant, Peggy, MD  DICLEGIS 10-10 MG TBEC Take 2 tablets by mouth at bedtime. If symptoms persist, add one tablet in the morning and one in the afternoon 05/28/20   Adam PhenixArnold, James G, MD    Allergies    Hydrocodone  Review of Systems   Review of Systems  Constitutional: Negative.  Negative for activity change, appetite change, chills, fatigue and fever.  HENT: Negative.   Eyes: Negative.   Respiratory: Negative.   Cardiovascular: Negative.   Gastrointestinal: Positive for abdominal pain. Negative for anal bleeding, blood in stool, constipation, diarrhea, nausea  and vomiting.  Genitourinary: Positive for urgency. Negative for decreased urine volume, difficulty urinating, dyspareunia, dysuria, enuresis, flank pain, frequency, genital sores, hematuria, menstrual problem, pelvic pain, vaginal bleeding, vaginal discharge and vaginal pain.       Foul vaginal odor.  Musculoskeletal: Negative.   Skin: Negative.   Hematological: Negative.     Physical Exam Updated Vital Signs BP 118/81 (BP Location: Right Arm)   Pulse 85   Temp 98.7 F (37.1 C)   Resp 18   Ht 5\' 7"  (1.702 m)   Wt 97.2 kg   LMP 03/29/2020   SpO2 100%   BMI 33.56 kg/m   Physical Exam Vitals and nursing note reviewed.  Constitutional:      Appearance: She is obese. She is not ill-appearing or toxic-appearing.  HENT:     Head: Normocephalic and atraumatic.     Nose: Nose normal.     Mouth/Throat:     Mouth: Mucous membranes are moist.     Pharynx: Oropharynx is clear. Uvula midline. No oropharyngeal exudate, posterior oropharyngeal erythema or uvula swelling.     Tonsils: No tonsillar exudate.  Eyes:     General: Lids are normal. Vision grossly intact.        Right eye: No discharge.        Left eye: No discharge.     Extraocular Movements: Extraocular movements intact.     Conjunctiva/sclera: Conjunctivae normal.     Pupils: Pupils are equal, round, and reactive to light.  Neck:     Trachea: Trachea and phonation normal.  Cardiovascular:     Rate and Rhythm: Normal rate and regular rhythm.     Pulses: Normal pulses.     Heart sounds: Normal heart sounds. No murmur heard.   Pulmonary:     Effort: Pulmonary effort is normal. No tachypnea, bradypnea, accessory muscle usage, prolonged expiration or respiratory distress.     Breath sounds: Normal breath sounds. No wheezing or rales.  Chest:     Chest wall: No mass, lacerations, deformity, swelling, tenderness, crepitus or edema.  Abdominal:     General: Bowel sounds are normal. There is no distension.     Palpations:  Abdomen is soft.     Tenderness: There is abdominal tenderness in the suprapubic area. There is no right CVA tenderness, left CVA tenderness, guarding or rebound. Negative signs include Murphy's sign and McBurney's sign.    Musculoskeletal:        General: No deformity.     Cervical  back: Normal range of motion and neck supple. No edema, rigidity or crepitus. No pain with movement.     Right lower leg: No edema.     Left lower leg: No edema.  Lymphadenopathy:     Cervical: No cervical adenopathy.  Skin:    General: Skin is warm and dry.     Capillary Refill: Capillary refill takes less than 2 seconds.     Findings: No rash.  Neurological:     Mental Status: She is alert. Mental status is at baseline.     Gait: Gait is intact.  Psychiatric:        Mood and Affect: Mood normal.     ED Results / Procedures / Treatments   Labs (all labs ordered are listed, but only abnormal results are displayed) Labs Reviewed  WET PREP, GENITAL - Abnormal; Notable for the following components:      Result Value   Clue Cells Wet Prep HPF POC PRESENT (*)    WBC, Wet Prep HPF POC FEW (*)    All other components within normal limits  BASIC METABOLIC PANEL - Abnormal; Notable for the following components:   Sodium 133 (*)    CO2 20 (*)    Calcium 8.7 (*)    All other components within normal limits  CBC WITH DIFFERENTIAL/PLATELET - Abnormal; Notable for the following components:   HCT 35.3 (*)    Abs Immature Granulocytes 0.15 (*)    All other components within normal limits  HCG, QUANTITATIVE, PREGNANCY - Abnormal; Notable for the following components:   hCG, Beta Chain, Quant, S 23,102 (*)    All other components within normal limits  URINE CULTURE  URINALYSIS, ROUTINE W REFLEX MICROSCOPIC    EKG None  Radiology US OB Limited  Result Date: 07/19/2020 CLINICAL DATA:  Suprapubic pain. EXAM: LIMITED OBSTETRIC ULTRASOUND COMPARISON:  May 28, 2020. FINDINGS: Number of Fetuses: 1 Heart  Rate:  162 bpm Movement: Yes Presentation: Cephalic Placental Location: Anterior Previa: No Amniotic Fluid (Subjective):  Within normal limits. BPD: 2.14 cm 16 w  3 d MATERNAL FINDINGS: Cervix:  Appears closed. Uterus/Adnexae: No abnormality visualized. IMPRESSION: Single live intrauterine gestation of 16 weeks 3 days. This exam is performed on an emergent basis and does not comprehensively evaluate fetal size, dating, or anatomy; follow-up complete OB US should be considered if further fetal assessment is warranted. Electronically Signed   By: Lupita Raider M.D.   On: 07/19/2020 20:41    Procedures Procedures   Medications Ordered in ED Medications - No data to display  ED Course  I have reviewed the triage vital signs and the nursing notes.  Pertinent labs & imaging results that were available during my care of the patient were reviewed by me and considered in my medical decision making (see chart for details).    MDM Rules/Calculators/A&P                         30 year old female who is [redacted] weeks pregnant who presents with concern for lower abdominal pressure and discomfort x2 weeks associated with urinary urgency and foul vaginal odor.  No vaginal bleeding, discharge, or decrease in fetal movement.  Differential diagnosis includes is not limited to pressure from gravid uterus, incarcerated uterus, urinary tract infection/cystitis/pyelonephritis, round ligament pain, threatened abortion, intrauterine infection.  Vital signs are normal on intake.  Cardiopulmonary exam is normal, abdominal exam is significant only for suprapubic tenderness to palpation without guarding.  Patient is neurovascularly intact in all 4 extremities.  ABO/Rh is in the system, B positive.  We will proceed with labs, urinalysis, and OB ultrasound.  CBC unremarkable, BMP with mild hyponatremia 133, otherwise unremarkable.  Quantitative hCG 23,102.  UA unremarkable.  Obstetric ultrasound was performed and revealed a  single live intrauterine gestation of approximately 16 weeks and 3 days.  Fetal heart rate 162.  Placental location is anterior without previa.  Normal amniotic fluid.  Patient underwent a self swab wet mount at OB/GYN, however results are not released.  She is requesting another swab today.  Refused pelvic exam, requesting self swab.  Wet mount was self collected, pending at this time.  Patient requesting discharge to follow-up on results in MyChart app.  Discharge paperwork was written, and prior to patient leaving the department, wet mount was resulted.  She has 1 mild positive for clue cells, concerning for bacterial vaginosis.  Antibiotic treatment was offered as patient is symptomatic with foul odor, however she declined as she does not want to take oral antibiotics.  Recommend close follow-up with OB/GYN.  No further work-up warranted in ED at this time.  While the exact etiology of the patient's symptoms remains unclear, there is not appear to be any emergent problem upon her evaluation in the ED today.  Recommend very close OB/GYN follow-up. Karen Mcmahon voiced understanding of her medical evaluation and treatment plan.  Each of her questions was answered to her expressed satisfaction.  Return precautions are given.  Patient is well-appearing, stable, and appropriate for discharge at this time.  This chart was dictated using voice recognition software, Dragon. Despite the best efforts of this provider to proofread and correct errors, errors may still occur which can change documentation meaning.  Final Clinical Impression(s) / ED Diagnoses Final diagnoses:  Abdominal pain affecting pregnancy  Abdominal pain during pregnancy in second trimester    Rx / DC Orders ED Discharge Orders    None       Sherrilee Gilles 07/19/20 2218    Mesner, Barbara Cower, MD 07/23/20 1761

## 2020-07-19 NOTE — ED Notes (Signed)
ED Provider at bedside. 

## 2020-07-19 NOTE — ED Triage Notes (Signed)
Pt is [redacted] weeks pregnant  (G2 P1). Reports lower abd pressure for "weeks". States it was worse today when she got off work. Denies vaginal bleeding/discharge. Denies dysuria

## 2020-07-19 NOTE — ED Notes (Signed)
AMA process explained and MSE waiver signed by patient. 

## 2020-07-21 ENCOUNTER — Other Ambulatory Visit: Payer: Self-pay

## 2020-07-21 DIAGNOSIS — N898 Other specified noninflammatory disorders of vagina: Secondary | ICD-10-CM

## 2020-07-21 LAB — CERVICOVAGINAL ANCILLARY ONLY
Bacterial Vaginitis (gardnerella): NEGATIVE
Candida Glabrata: NEGATIVE
Candida Vaginitis: NEGATIVE
Chlamydia: NEGATIVE
Comment: NEGATIVE
Comment: NEGATIVE
Comment: NEGATIVE
Comment: NEGATIVE
Comment: NEGATIVE
Comment: NORMAL
Neisseria Gonorrhea: NEGATIVE
Trichomonas: NEGATIVE

## 2020-07-21 LAB — URINE CULTURE: Culture: NO GROWTH

## 2020-07-21 MED ORDER — METRONIDAZOLE 0.75 % VA GEL: 1.0000 | Freq: Every day | VAGINAL | 0 refills | Status: DC

## 2020-07-21 NOTE — ED Notes (Signed)
Pt had called requested work note for [employer], information provided on the guidelines of work notes from an ED visit. Note was provided stating that client is to follow up with her Primary MD / Primary OB/GYN for any letters stating availability for work and or restrictions.

## 2020-07-29 ENCOUNTER — Ambulatory Visit (INDEPENDENT_AMBULATORY_CARE_PROVIDER_SITE_OTHER): Payer: Medicaid Other | Admitting: Obstetrics

## 2020-07-29 ENCOUNTER — Other Ambulatory Visit: Payer: Self-pay

## 2020-07-29 ENCOUNTER — Encounter: Payer: Self-pay | Admitting: Obstetrics

## 2020-07-29 VITALS — BP 122/78 | HR 85 | Wt 219.2 lb

## 2020-07-29 DIAGNOSIS — Z3480 Encounter for supervision of other normal pregnancy, unspecified trimester: Secondary | ICD-10-CM

## 2020-07-29 NOTE — Progress Notes (Signed)
Patient presents for ROB. Patient has no concerns.  

## 2020-07-29 NOTE — Progress Notes (Signed)
Subjective:  Karen Mcmahon is a 30 y.o. [phone] at [redacted]w[redacted]d being seen today for ongoing prenatal care.  She is currently monitored for the following issues for this low-risk pregnancy and has Keloid scar of skin; Left arm pain; History of recurrent vaginal discharge; Encounter for supervision of normal pregnancy in first trimester; Maternal obesity affecting pregnancy, antepartum; and Alpha thalassemia silent carrier on their problem list.  Patient reports pelvic pressure.  Contractions: Not present. Vag. Bleeding: None.  Movement: Present. Denies leaking of fluid.   The following portions of the patient's history were reviewed and updated as appropriate: allergies, current medications, past family history, past medical history, past social history, past surgical history and problem list. Problem list updated.  Objective:   Vitals:   07/29/20 1458  BP: 122/78  Pulse: 85  Weight: 219 lb 3.2 oz (99.4 kg)    Fetal Status:     Movement: Present     General:  Alert, oriented and cooperative. Patient is in no acute distress.  Skin: Skin is warm and dry. No rash noted.   Cardiovascular: Normal heart rate noted  Respiratory: Normal respiratory effort, no problems with respiration noted  Abdomen: Soft, gravid, appropriate for gestational age. Pain/Pressure: Absent     Pelvic:  Cervical exam deferred        Extremities: Normal range of motion.  Edema: None  Mental Status: Normal mood and affect. Normal behavior. Normal judgment and thought content.   Urinalysis:      Assessment and Plan:  Pregnancy: G4P1021 at [redacted]w[redacted]d  1. Supervision of other normal pregnancy, antepartum Rx: - AFP, Serum, Open Spina Bifida   Preterm labor symptoms and general obstetric precautions including but not limited to vaginal bleeding, contractions, leaking of fluid and fetal movement were reviewed in detail with the patient. Please refer to After Visit Summary for other counseling recommendations.   Return in about  4 weeks (around 08/26/2020) for ROB.   Brock Bad, MD  07/29/20

## 2020-08-04 ENCOUNTER — Other Ambulatory Visit: Payer: Self-pay

## 2020-08-04 ENCOUNTER — Ambulatory Visit (INDEPENDENT_AMBULATORY_CARE_PROVIDER_SITE_OTHER): Payer: Medicaid Other | Admitting: Advanced Practice Midwife

## 2020-08-04 VITALS — BP 127/85 | HR 94 | Wt 228.0 lb

## 2020-08-04 DIAGNOSIS — Z3480 Encounter for supervision of other normal pregnancy, unspecified trimester: Secondary | ICD-10-CM

## 2020-08-04 DIAGNOSIS — N760 Acute vaginitis: Secondary | ICD-10-CM

## 2020-08-04 DIAGNOSIS — B9689 Other specified bacterial agents as the cause of diseases classified elsewhere: Secondary | ICD-10-CM

## 2020-08-04 DIAGNOSIS — Z3A18 18 weeks gestation of pregnancy: Secondary | ICD-10-CM

## 2020-08-04 NOTE — Patient Instructions (Signed)
Some natural remedies/prevention to try for bacterial vaginosis: --Take a probiotic tablet/capsule every day for at least 1-2 months.   --Whenever you have symptoms, use boric acid suppositories or tampons with coconut oil and 2-3 drops of tea tree oil vaginally every night for a week.   --Do not use scented soaps/perfumes in the vaginal area, and do not overwash multiple times daily. --Wear breathable cotton underwear and do not wear tight restrictive clothing. --Limit pantyliner use, change your underwear several times daily instead. --Use condoms during intercourse.  

## 2020-08-04 NOTE — Progress Notes (Signed)
   PRENATAL VISIT NOTE  Subjective:  Karen Mcmahon is a 30 y.o. [id number] at [redacted]w[redacted]d being seen today for ongoing prenatal care.  She is currently monitored for the following issues for this low-risk pregnancy and has Keloid scar of skin; Left arm pain; History of recurrent vaginal discharge; Encounter for supervision of normal pregnancy in first trimester; Maternal obesity affecting pregnancy, antepartum; and Alpha thalassemia silent carrier on their problem list.  Patient reports vaginal discharge with odor.  Contractions: Not present. Vag. Bleeding: None.  Movement: Present. Denies leaking of fluid.   The following portions of the patient's history were reviewed and updated as appropriate: allergies, current medications, past family history, past medical history, past social history, past surgical history and problem list.   Objective:   Vitals:   08/04/20 1052  BP: 127/85  Pulse: 94  Weight: 228 lb (103.4 kg)    Fetal Status: Fetal Heart Rate (bpm): 154   Movement: Present     General:  Alert, oriented and cooperative. Patient is in no acute distress.  Skin: Skin is warm and dry. No rash noted.   Cardiovascular: Normal heart rate noted  Respiratory: Normal respiratory effort, no problems with respiration noted  Abdomen: Soft, gravid, appropriate for gestational age.  Pain/Pressure: Present     Pelvic: Cervical exam deferred        Extremities: Normal range of motion.  Edema: None  Mental Status: Normal mood and affect. Normal behavior. Normal judgment and thought content.   Assessment and Plan:  Pregnancy: G4P1021 at [redacted]w[redacted]d 1. Supervision of other normal pregnancy, antepartum --Anticipatory guidance about next visits/weeks of pregnancy given. --anatomy US scheduled with MFM next week  --Next visit in 4 weeks  2. [redacted] weeks gestation of pregnancy   3. Bacterial vaginosis --Pt using metrogel but feels like it is not helping. Has vaginal discharge with odor. --Denies any STD  risks, recent testing in pregnancy negative --Discussed options, including tx with Flagyl, and preventive/homeopathic strategies. Pt would like to try prevention/homeopathic options. --Teaching about BV prevention, including use of probiotics, breathable cotton underwear, washing less frequently, boric acid or tea tree oil suppositories discussed/printed info for patient. --F/U at next visit if persists.  Preterm labor symptoms and general obstetric precautions including but not limited to vaginal bleeding, contractions, leaking of fluid and fetal movement were reviewed in detail with the patient. Please refer to After Visit Summary for other counseling recommendations.   Return in about 4 weeks (around 09/01/2020).  Future Appointments  Date Time Provider Department Center  08/11/2020 10:15 AM [facility]  09/02/2020 10:15 AM [facility], Wilmer Floor, CNM [facility] None    Sharen Counter, CNM

## 2020-08-07 ENCOUNTER — Other Ambulatory Visit: Payer: Self-pay

## 2020-08-07 ENCOUNTER — Telehealth: Payer: Self-pay

## 2020-08-07 ENCOUNTER — Emergency Department (HOSPITAL_BASED_OUTPATIENT_CLINIC_OR_DEPARTMENT_OTHER)
Admission: EM | Admit: 2020-08-07 | Discharge: 2020-08-07 | Disposition: A | Discharge status: Home / Self Care | Payer: Medicaid Other | Attending: Emergency Medicine | Admitting: Emergency Medicine

## 2020-08-07 ENCOUNTER — Encounter (HOSPITAL_BASED_OUTPATIENT_CLINIC_OR_DEPARTMENT_OTHER): Payer: Self-pay | Admitting: *Deleted

## 2020-08-07 DIAGNOSIS — O132 Gestational [pregnancy-induced] hypertension without significant proteinuria, second trimester: Secondary | ICD-10-CM | POA: Insufficient documentation

## 2020-08-07 DIAGNOSIS — Z7982 Long term (current) use of aspirin: Secondary | ICD-10-CM | POA: Insufficient documentation

## 2020-08-07 DIAGNOSIS — R7401 Elevation of levels of liver transaminase levels: Secondary | ICD-10-CM | POA: Diagnosis not present

## 2020-08-07 DIAGNOSIS — Z3A18 18 weeks gestation of pregnancy: Secondary | ICD-10-CM | POA: Diagnosis not present

## 2020-08-07 DIAGNOSIS — O2692 Pregnancy related conditions, unspecified, second trimester: Secondary | ICD-10-CM | POA: Insufficient documentation

## 2020-08-07 DIAGNOSIS — Z87891 Personal history of nicotine dependence: Secondary | ICD-10-CM | POA: Diagnosis not present

## 2020-08-07 DIAGNOSIS — O1202 Gestational edema, second trimester: Secondary | ICD-10-CM | POA: Insufficient documentation

## 2020-08-07 DIAGNOSIS — Z3492 Encounter for supervision of normal pregnancy, unspecified, second trimester: Secondary | ICD-10-CM

## 2020-08-07 LAB — COMPREHENSIVE METABOLIC PANEL
ALT: 79 U/L — ABNORMAL HIGH (ref 0–44)
AST: 49 U/L — ABNORMAL HIGH (ref 15–41)
Albumin: 3.3 g/dL — ABNORMAL LOW (ref 3.5–5.0)
Alkaline Phosphatase: 62 U/L (ref 38–126)
Anion gap: 6 (ref 5–15)
BUN: 10 mg/dL (ref 6–20)
CO2: 23 mmol/L (ref 22–32)
Calcium: 8.7 mg/dL — ABNORMAL LOW (ref 8.9–10.3)
Chloride: 106 mmol/L (ref 98–111)
Creatinine, Ser: 0.59 mg/dL (ref 0.44–1.00)
GFR, Estimated: >60 mL/min (ref >60)
Glucose, Bld: 75 mg/dL (ref 70–99)
Potassium: 3.7 mmol/L (ref 3.5–5.1)
Sodium: 135 mmol/L (ref 135–145)
Total Bilirubin: 0.3 mg/dL (ref 0.3–1.2)
Total Protein: 6.5 g/dL (ref 6.5–8.1)

## 2020-08-07 LAB — CBC WITH DIFFERENTIAL/PLATELET
Abs Immature Granulocytes: 0.19 10*3/uL — ABNORMAL HIGH (ref 0.00–0.07)
Basophils Absolute: 0 10*3/uL (ref 0.0–0.1)
Basophils Relative: 0 %
Eosinophils Absolute: 0.4 10*3/uL (ref 0.0–0.5)
Eosinophils Relative: 4 %
HCT: 35.9 % — ABNORMAL LOW (ref 36.0–46.0)
Hemoglobin: 12.2 g/dL (ref 12.0–15.0)
Immature Granulocytes: 2 %
Lymphocytes Relative: 12 %
Lymphs Abs: 1.2 10*3/uL (ref 0.7–4.0)
MCH: 30.3 pg (ref 26.0–34.0)
MCHC: 34 g/dL (ref 30.0–36.0)
MCV: 89.1 fL (ref 80.0–100.0)
Monocytes Absolute: 0.9 10*3/uL (ref 0.1–1.0)
Monocytes Relative: 10 %
Neutro Abs: 6.8 10*3/uL (ref 1.7–7.7)
Neutrophils Relative %: 72 %
Platelets: 205 10*3/uL (ref 150–400)
RBC: 4.03 MIL/uL (ref 3.87–5.11)
RDW: 14.9 % (ref 11.5–15.5)
WBC: 9.5 10*3/uL (ref 4.0–10.5)
nRBC: 0 % (ref 0.0–0.2)

## 2020-08-07 LAB — URINALYSIS, ROUTINE W REFLEX MICROSCOPIC
Bilirubin Urine: NEGATIVE
Glucose, UA: NEGATIVE mg/dL
Hgb urine dipstick: NEGATIVE
Ketones, ur: NEGATIVE mg/dL
Leukocytes,Ua: NEGATIVE
Nitrite: NEGATIVE
Protein, ur: NEGATIVE mg/dL
Specific Gravity, Urine: >1.03 — ABNORMAL HIGH (ref 1.005–1.030)
pH: 6 (ref 5.0–8.0)

## 2020-08-07 NOTE — ED Provider Notes (Signed)
[facility] EMERGENCY DEPARTMENT Provider Note   CSN: [id number] Arrival date & time: 08/07/20  1416     History Chief Complaint  Patient presents with   Leg Swelling    Pregnant    Karen Mcmahon is a 30 y.o. female who is G2P1 and is currently [redacted] weeks pregnant who presents for evaluation of hypertension and swelling in bilateral feet.  Patient reports that this morning, she felt like her bilateral feet were swollen.  She states she took her blood pressure and noted it to be 171/118 at home.  She called her OB/GYN and they recommended her coming to the emergency department.  Patient states she has no prior history of hypertension.  She states that she has not had a swelling that extends up the leg.  She denies any abdominal pain, nausea/vomiting, headache, blurry vision.  Denies any chest pain, difficulty breathing.  She does report she is occasionally had some scratchy throat.  She has allergies and routinely experiences postnasal drip. She is followed my [employer].   The history is provided by the patient.      Past Medical History:  Diagnosis Date   Arthritis    Hx of gonorrhea    Keloid    Left Upper Lateral [place]   Pregnant    hx   Trichomonas contact, treated     Patient Active Problem List   Diagnosis Date Noted   Alpha thalassemia silent carrier 06/24/2020   Maternal obesity affecting pregnancy, antepartum 06/09/2020   Encounter for supervision of normal pregnancy in first trimester 05/28/2020   History of recurrent vaginal discharge 11/30/2018   Left arm pain 11/15/2018   Keloid scar of skin 03/22/2012    Past Surgical History:  Procedure Laterality Date   EAR CYST EXCISION Left 04/05/2014   Procedure: EXCISION EAR CYST;  Surgeon: Ruby Cola, MD;  Location: [facility];  Service: ENT;  Laterality: Left;  Excision of keloid   EXTERNAL EAR SURGERY Left 12     OB History     Gravida  4   Para  1   Term  1   Preterm      AB  2   Living  1       SAB  1   IAB  1   Ectopic      Multiple      Live Births  1           Family History  Problem Relation Age of Onset   Diabetes Mother    Hypertension Mother    Pancreatic cancer Father    Diabetes Maternal Grandmother    Hypertension Maternal Grandmother    Other Neg Hx     Social History   Tobacco Use   Smoking status: Former    Packs/day: 0.50    Years: 2.00    Pack years: 1.00    Types: Cigarettes    Quit date: 03/18/2014    Years since quitting: 6.3   Smokeless tobacco: Never  Vaping Use   Vaping Use: Never used  Substance Use Topics   Alcohol use: Not Currently    Comment: 1 mixed drink daily. Not since confirmed pregnancy   Drug use: Not Currently    Types: Marijuana    Comment: 1 monthlast. Not since confirmed pregnancy    Home Medications Prior to Admission medications   Medication Sig Start Date End Date Taking? Authorizing Provider  aspirin EC 81 MG tablet Take 1 tablet (81  mg total) by mouth daily. Take after 12 weeks for prevention of preeclampsia later in pregnancy 06/09/20  Yes Constant, Peggy, MD  Prenatal Vit-Fe Fumarate-FA (PREPLUS) 27-1 MG TABS Take 1 tablet by mouth daily. 06/09/20  Yes Constant, Peggy, MD  DICLEGIS 10-10 MG TBEC Take 2 tablets by mouth at bedtime. If symptoms persist, add one tablet in the morning and one in the afternoon Patient not taking: No sig reported 05/28/20   Woodroe Mode, MD  metroNIDAZOLE (METROGEL) 0.75 % vaginal gel Place 1 Applicatorful vaginally at bedtime. Apply one applicatorful to vagina at bedtime for 5 days Patient not taking: No sig reported 07/21/20   Shelly Bombard, MD  Prenat-Fe Carbonyl-FA-Omega 3 (ONE-A-DAY WOMENS PRENATAL 1) 28-0.8-235 MG CAPS Take by mouth.    [provider]    Allergies    Hydrocodone  Review of Systems   Review of Systems  Constitutional:  Negative for fever.  Respiratory:  Negative for cough and shortness of breath.   Cardiovascular:  Positive for  leg swelling. Negative for chest pain.  Gastrointestinal:  Negative for abdominal pain, nausea and vomiting.  Genitourinary:  Negative for dysuria and hematuria.  Neurological:  Negative for headaches.  All other systems reviewed and are negative.  Physical Exam Updated Vital Signs BP 120/80 (BP Location: Right Arm)   Pulse 96   Temp 98.4 F (36.9 C) (Oral)   Resp 16   Ht $R'5\' 7"'Rt$  (1.702 m)   Wt 99.8 kg   LMP 03/29/2020   SpO2 100%   BMI 34.46 kg/m   Physical Exam Vitals and nursing note reviewed.  Constitutional:      Appearance: Normal appearance. She is well-developed.  HENT:     Head: Normocephalic and atraumatic.  Eyes:     General: Lids are normal.     Conjunctiva/sclera: Conjunctivae normal.     Pupils: Pupils are equal, round, and reactive to light.     Comments: PERRL. EOMs intact. No nystagmus. No neglect.   Cardiovascular:     Rate and Rhythm: Normal rate and regular rhythm.     Pulses: Normal pulses.     Heart sounds: Normal heart sounds. No murmur heard.   No friction rub. No gallop.  Pulmonary:     Effort: Pulmonary effort is normal.     Breath sounds: Normal breath sounds.     Comments: Lungs clear to auscultation bilaterally.  Symmetric chest rise.  No wheezing, rales, rhonchi. Abdominal:     Palpations: Abdomen is soft. Abdomen is not rigid.     Tenderness: There is no abdominal tenderness. There is no guarding.     Comments: Gravid abdomen. No tenderness.   Musculoskeletal:        General: Normal range of motion.     Cervical back: Full passive range of motion without pain.     Comments: BLE are symmetric in appearance with no overlying warmth or erythema. No appreciable edema noted on my exam.   Skin:    General: Skin is warm and dry.     Capillary Refill: Capillary refill takes less than 2 seconds.  Neurological:     Mental Status: She is alert and oriented to person, place, and time.  Psychiatric:        Speech: Speech normal.    ED Results /  Procedures / Treatments   Labs (all labs ordered are listed, but only abnormal results are displayed) Labs Reviewed  URINALYSIS, ROUTINE W REFLEX MICROSCOPIC - Abnormal; Notable for  the following components:      Result Value   Specific Gravity, Urine >1.030 (*)    All other components within normal limits  COMPREHENSIVE METABOLIC PANEL - Abnormal; Notable for the following components:   Calcium 8.7 (*)    Albumin 3.3 (*)    AST 49 (*)    ALT 79 (*)    All other components within normal limits  CBC WITH DIFFERENTIAL/PLATELET - Abnormal; Notable for the following components:   HCT 35.9 (*)    Abs Immature Granulocytes 0.19 (*)    All other components within normal limits    EKG None  Radiology No results found.  Procedures Procedures   Medications Ordered in ED Medications - No data to display  ED Course  I have reviewed the triage vital signs and the nursing notes.  Pertinent labs & imaging results that were available during my care of the patient were reviewed by me and considered in my medical decision making (see chart for details).    MDM Rules/Calculators/A&P                          30 year old female who presents for evaluation of leg swelling, hypertension.  She is G2, P1 and is currently [redacted] weeks pregnant.  She reports that at home, she checked her blood pressure think it was elevated with systolic blood pressure in the 170s.  She states she also noted some swelling in her feet.  She called her OB/GYN who advised her to come to the emergency department.  She denies any abdominal pain, nausea/vomiting, headache, vision changes.  No prior history of hypertension.  On initial arrival, she is afebrile, toxic appearing.  Her blood pressure here is 105/74.  On my exam, she is well-appearing.  I do not notice any appreciable swelling to her legs.  Question of there is some mild swelling to her feet.  This may be her normal related to pregnancy.  No pitting edema noted.  No  swelling around her face.  We will monitor her and recheck her blood pressure as well as basic labs, urine.  UA shows no bacteria.  No evidence of protein in the urine.  CBC shows no leukocytosis.  Hemoglobin stable at 12.2. CMP shows AST of 49, ALT of 79.   Patients blood pressure stable here in the ED. her liver enzymes are slightly elevated at 49 and 79 today but her other lab work, including total bili, alk phos, platelets are reassuring and not indicative of help syndrome.  At this time, patient is not hypertensive in the ED.  Her work-up is not consistent with preeclampsia.  I did discuss with patient.  Instructed her to follow-up with her OB/GYN in 1 week forwarding her lab work. At this time, patient exhibits no emergent life-threatening condition that require further evaluation in ED. Discussed patient with Dr. Joya Gaskins who is agreeable to plan. Patient had ample opportunity for questions and discussion. All patient's questions were answered with full understanding. Strict return precautions discussed. Patient expresses understanding and agreement to plan.   Portions of this note were generated with [employer]. Dictation errors may occur despite best attempts at proofreading.  Final Clinical Impression(s) / ED Diagnoses Final diagnoses:  Encounter for pregnancy related examination in second trimester  Transaminitis    Rx / DC Orders ED Discharge Orders     None        Desma Mcgregor 08/07/20 1904  Arnaldo Natal, MD 08/07/20 2337

## 2020-08-07 NOTE — Telephone Encounter (Signed)
Return call to pt regarding swelling and elevated B/P reading at home. Pt denies any visual changes or dizziness no HA's. States B/P 30 mins ago was 174/80  I had pt repeat B/P while on phone reading was 143/93 pt noted no fetal movement today I advised this is a point where FM may began as pregnancy progresses Pt states she usually has FM all the time. Pt advised for assurance she could go to [facility].

## 2020-08-07 NOTE — Discharge Instructions (Signed)
As we discussed, your work-up looked reassuring today.  Your blood pressures here were reassuring.  Your lab work did show that your liver enzymes were just slightly elevated.  This needs to be rechecked by your primary care doctor or your OB/GYN in 1 week.  Return to the emergency department if you have any abdominal pain, nausea/vomiting, headache, vision changes, any worsening swelling in your legs or face.

## 2020-08-07 NOTE — ED Notes (Signed)
Attempted to draw labs, unsuccessful. Tol well.

## 2020-08-07 NOTE — ED Triage Notes (Signed)
States she is [redacted] weeks pregnant. Here with swelling in her both feet and elevated BP since yesterday.

## 2020-08-08 ENCOUNTER — Telehealth: Payer: Self-pay

## 2020-08-08 NOTE — Telephone Encounter (Signed)
Pt caled letting us know she recently tested + for COVID . Went over safe medications if having cold /fever sx's Went over precautions to report to ER Pt noted +FM, no contractions, no bleeding Advised pt to try a cool mist vaporizer to help with congestions. Pt agreeable and voiced understanding

## 2020-08-11 ENCOUNTER — Ambulatory Visit: Payer: Medicaid Other | Attending: Obstetrics and Gynecology

## 2020-08-11 ENCOUNTER — Other Ambulatory Visit: Payer: Self-pay

## 2020-08-13 ENCOUNTER — Other Ambulatory Visit: Payer: Self-pay | Admitting: *Deleted

## 2020-08-13 ENCOUNTER — Ambulatory Visit: Payer: Medicaid Other | Attending: Obstetrics and Gynecology

## 2020-08-13 ENCOUNTER — Other Ambulatory Visit: Payer: Self-pay

## 2020-08-13 DIAGNOSIS — D563 Thalassemia minor: Secondary | ICD-10-CM

## 2020-08-13 DIAGNOSIS — Z3482 Encounter for supervision of other normal pregnancy, second trimester: Secondary | ICD-10-CM | POA: Insufficient documentation

## 2020-08-13 DIAGNOSIS — Z363 Encounter for antenatal screening for malformations: Secondary | ICD-10-CM | POA: Diagnosis not present

## 2020-08-13 DIAGNOSIS — Z3A19 19 weeks gestation of pregnancy: Secondary | ICD-10-CM | POA: Diagnosis not present

## 2020-08-13 DIAGNOSIS — Z3481 Encounter for supervision of other normal pregnancy, first trimester: Secondary | ICD-10-CM

## 2020-09-02 ENCOUNTER — Other Ambulatory Visit: Payer: Self-pay

## 2020-09-02 ENCOUNTER — Ambulatory Visit (INDEPENDENT_AMBULATORY_CARE_PROVIDER_SITE_OTHER): Payer: Medicaid Other | Admitting: Advanced Practice Midwife

## 2020-09-02 VITALS — BP 114/80 | HR 96 | Wt 245.0 lb

## 2020-09-02 DIAGNOSIS — Z3480 Encounter for supervision of other normal pregnancy, unspecified trimester: Secondary | ICD-10-CM

## 2020-09-02 DIAGNOSIS — Z3A22 22 weeks gestation of pregnancy: Secondary | ICD-10-CM

## 2020-09-02 NOTE — Progress Notes (Signed)
   PRENATAL VISIT NOTE  Subjective:  Karen Mcmahon is a 30 y.o. [phone] at [redacted]w[redacted]d being seen today for ongoing prenatal care.  She is currently monitored for the following issues for this low-risk pregnancy and has Keloid scar of skin; Left arm pain; History of recurrent vaginal discharge; Encounter for supervision of normal pregnancy in first trimester; Maternal obesity affecting pregnancy, antepartum; and Alpha thalassemia silent carrier on their problem list.  Patient reports no complaints.  Contractions: Irritability. Vag. Bleeding: None.  Movement: Present. Denies leaking of fluid.   The following portions of the patient's history were reviewed and updated as appropriate: allergies, current medications, past family history, past medical history, past social history, past surgical history and problem list.   Objective:   Vitals:   09/02/20 1019  BP: 114/80  Pulse: 96  Weight: 245 lb (111.1 kg)    Fetal Status: Fetal Heart Rate (bpm): 146   Movement: Present     General:  Alert, oriented and cooperative. Patient is in no acute distress.  Skin: Skin is warm and dry. No rash noted.   Cardiovascular: Normal heart rate noted  Respiratory: Normal respiratory effort, no problems with respiration noted  Abdomen: Soft, gravid, appropriate for gestational age.  Pain/Pressure: Present     Pelvic: Cervical exam deferred        Extremities: Normal range of motion.  Edema: None  Mental Status: Normal mood and affect. Normal behavior. Normal judgment and thought content.   Assessment and Plan:  Pregnancy: G4P1021 at [redacted]w[redacted]d 1. Supervision of other normal pregnancy, antepartum --Anticipatory guidance about next visits/weeks of pregnancy given. --Questions answered about safety of caffeine/coffee in pregnancy --Next visit in 4 weeks for GTT, discussed need to be fasting and appt in am  2. [redacted] weeks gestation of pregnancy   Preterm labor symptoms and general obstetric precautions including but  not limited to vaginal bleeding, contractions, leaking of fluid and fetal movement were reviewed in detail with the patient. Please refer to After Visit Summary for other counseling recommendations.   No follow-ups on file.  Future Appointments  Date Time Provider Department Center  09/10/2020  8:45 AM [facility] NURSE [facility]  09/10/2020  9:00 AM [facility] US1 [facility]    Sharen Counter, CNM

## 2020-09-10 ENCOUNTER — Other Ambulatory Visit: Payer: Self-pay

## 2020-09-10 ENCOUNTER — Ambulatory Visit: Payer: Medicaid Other | Admitting: *Deleted

## 2020-09-10 ENCOUNTER — Ambulatory Visit: Payer: Medicaid Other | Attending: Obstetrics and Gynecology

## 2020-09-10 ENCOUNTER — Encounter: Payer: Self-pay | Admitting: *Deleted

## 2020-09-10 VITALS — BP 111/73 | HR 94

## 2020-09-10 DIAGNOSIS — Z363 Encounter for antenatal screening for malformations: Secondary | ICD-10-CM | POA: Diagnosis not present

## 2020-09-10 DIAGNOSIS — E669 Obesity, unspecified: Secondary | ICD-10-CM | POA: Diagnosis not present

## 2020-09-10 DIAGNOSIS — D563 Thalassemia minor: Secondary | ICD-10-CM

## 2020-09-10 DIAGNOSIS — O99210 Obesity complicating pregnancy, unspecified trimester: Secondary | ICD-10-CM

## 2020-09-10 DIAGNOSIS — O99212 Obesity complicating pregnancy, second trimester: Secondary | ICD-10-CM

## 2020-09-10 DIAGNOSIS — Z148 Genetic carrier of other disease: Secondary | ICD-10-CM

## 2020-09-10 DIAGNOSIS — Z3A23 23 weeks gestation of pregnancy: Secondary | ICD-10-CM

## 2020-09-18 ENCOUNTER — Telehealth: Payer: Self-pay

## 2020-09-18 NOTE — Telephone Encounter (Signed)
Patient called complaining of having some mild cramping that started yesterday. Patient states that she is feeling baby move well. Patient advised that some mild cramping can be normal in pregnancy as her uterus grows. Advised patient to increase water intake, take tylenol, and to rest as much as possible. If pain worsens then she may need to be evaluated.

## 2020-09-30 ENCOUNTER — Other Ambulatory Visit: Payer: Medicaid Other

## 2020-09-30 ENCOUNTER — Telehealth: Payer: Self-pay | Admitting: Licensed Clinical Social Worker

## 2020-09-30 ENCOUNTER — Ambulatory Visit (INDEPENDENT_AMBULATORY_CARE_PROVIDER_SITE_OTHER): Payer: Medicaid Other | Admitting: Obstetrics and Gynecology

## 2020-09-30 ENCOUNTER — Other Ambulatory Visit: Payer: Self-pay

## 2020-09-30 DIAGNOSIS — Z3481 Encounter for supervision of other normal pregnancy, first trimester: Secondary | ICD-10-CM

## 2020-09-30 DIAGNOSIS — Z3A26 26 weeks gestation of pregnancy: Secondary | ICD-10-CM | POA: Insufficient documentation

## 2020-09-30 NOTE — Progress Notes (Signed)
   PRENATAL VISIT NOTE  Subjective:  Karen Mcmahon is a 30 y.o. [phone] at [redacted]w[redacted]d being seen today for ongoing prenatal care.  She is currently monitored for the following issues for this low-risk pregnancy and has Keloid scar of skin; Left arm pain; History of recurrent vaginal discharge; Encounter for supervision of normal pregnancy in first trimester; Maternal obesity affecting pregnancy, antepartum; Alpha thalassemia silent carrier; and [redacted] weeks gestation of pregnancy on their problem list.  Patient doing well with no acute concerns today. She reports no complaints.  Contractions: Irritability. Vag. Bleeding: None.  Movement: Present. Denies leaking of fluid.   Pt did note some contractions on Sunday.  None today.  Advised increased hydration.  The following portions of the patient's history were reviewed and updated as appropriate: allergies, current medications, past family history, past medical history, past social history, past surgical history and problem list. Problem list updated.  Objective:   Vitals:   09/30/20 0815  BP: 123/79  Pulse: (!) 102  Weight: 246 lb (111.6 kg)    Fetal Status: Fetal Heart Rate (bpm): 144   Movement: Present     General:  Alert, oriented and cooperative. Patient is in no acute distress.  Skin: Skin is warm and dry. No rash noted.   Cardiovascular: Normal heart rate noted  Respiratory: Normal respiratory effort, no problems with respiration noted  Abdomen: Soft, gravid, appropriate for gestational age.  Pain/Pressure: Present     Pelvic: Cervical exam deferred        Extremities: Normal range of motion.  Edema: Trace  Mental Status:  Normal mood and affect. Normal behavior. Normal judgment and thought content.   Assessment and Plan:  Pregnancy: G4P1021 at [redacted]w[redacted]d  1. Encounter for supervision of other normal pregnancy in first trimester Continue routine care - RPR - HIV antibody (with reflex) - CBC - Glucose Tolerance, 2 Hours w/1 Hour  2. [redacted]  weeks gestation of pregnancy   Preterm labor symptoms and general obstetric precautions including but not limited to vaginal bleeding, contractions, leaking of fluid and fetal movement were reviewed in detail with the patient.  Please refer to After Visit Summary for other counseling recommendations.   Return in about 2 weeks (around 10/14/2020) for ROB, in person.   Mariel Aloe, MD Faculty Attending Center for [facility]

## 2020-09-30 NOTE — Progress Notes (Signed)
ROB/GTT.  Pt declined TDAP vaccine.

## 2020-09-30 NOTE — Telephone Encounter (Signed)
Called pt to offer integrated behavioral health services due to [id number]. Left message requesting callback

## 2020-10-01 LAB — GLUCOSE TOLERANCE, 2 HOURS W/ 1HR
Glucose, 1 hour: 192 mg/dL — ABNORMAL HIGH (ref 65–179)
Glucose, 2 hour: 145 mg/dL (ref 65–152)
Glucose, Fasting: 97 mg/dL — ABNORMAL HIGH (ref 65–91)

## 2020-10-01 LAB — CBC
Hematocrit: 37.7 % (ref 34.0–46.6)
Hemoglobin: 12.6 g/dL (ref 11.1–15.9)
MCH: 29.8 pg (ref 26.6–33.0)
MCHC: 33.4 g/dL (ref 31.5–35.7)
MCV: 89 fL (ref 79–97)
Platelets: 191 10*3/uL (ref 150–450)
RBC: 4.23 x10E6/uL (ref 3.77–5.28)
RDW: 12.8 % (ref 11.7–15.4)
WBC: 10.9 10*3/uL — ABNORMAL HIGH (ref 3.4–10.8)

## 2020-10-01 LAB — RPR: RPR Ser Ql: NONREACTIVE

## 2020-10-01 LAB — HIV ANTIBODY (ROUTINE TESTING W REFLEX): HIV Screen 4th Generation wRfx: NONREACTIVE

## 2020-10-02 ENCOUNTER — Other Ambulatory Visit: Payer: Self-pay

## 2020-10-02 DIAGNOSIS — O24419 Gestational diabetes mellitus in pregnancy, unspecified control: Secondary | ICD-10-CM

## 2020-10-02 MED ORDER — ACCU-CHEK SOFTCLIX LANCETS MISC: 12 refills | Status: DC

## 2020-10-02 MED ORDER — ACCU-CHEK GUIDE W/DEVICE KIT: 1.0000 | PACK | Freq: Four times a day (QID) | 0 refills | Status: DC

## 2020-10-02 MED ORDER — ACCU-CHEK GUIDE VI STRP: ORAL_STRIP | 12 refills | Status: DC

## 2020-10-02 NOTE — Progress Notes (Signed)
Diabetic supplies.

## 2020-10-14 ENCOUNTER — Ambulatory Visit (INDEPENDENT_AMBULATORY_CARE_PROVIDER_SITE_OTHER): Payer: Medicaid Other | Admitting: Advanced Practice Midwife

## 2020-10-14 ENCOUNTER — Other Ambulatory Visit: Payer: Self-pay

## 2020-10-14 VITALS — BP 124/79 | HR 97 | Wt 255.0 lb

## 2020-10-14 DIAGNOSIS — O24419 Gestational diabetes mellitus in pregnancy, unspecified control: Secondary | ICD-10-CM

## 2020-10-14 DIAGNOSIS — N898 Other specified noninflammatory disorders of vagina: Secondary | ICD-10-CM

## 2020-10-14 DIAGNOSIS — Z3A28 28 weeks gestation of pregnancy: Secondary | ICD-10-CM

## 2020-10-14 DIAGNOSIS — Z3481 Encounter for supervision of other normal pregnancy, first trimester: Secondary | ICD-10-CM

## 2020-10-14 DIAGNOSIS — O26893 Other specified pregnancy related conditions, third trimester: Secondary | ICD-10-CM

## 2020-10-14 MED ORDER — METRONIDAZOLE 500 MG PO TABS: 500.0000 mg | ORAL_TABLET | Freq: Two times a day (BID) | ORAL | 0 refills | Status: AC

## 2020-10-14 NOTE — Progress Notes (Signed)
   PRENATAL VISIT NOTE  Subjective:  Karen Mcmahon is a 30 y.o. [phone] at [redacted]w[redacted]d being seen today for ongoing prenatal care.  She is currently monitored for the following issues for this low-risk pregnancy and has Keloid scar of skin; Left arm pain; History of recurrent vaginal discharge; Encounter for supervision of normal pregnancy in first trimester; Maternal obesity affecting pregnancy, antepartum; Alpha thalassemia silent carrier; and [redacted] weeks gestation of pregnancy on their problem list.  Patient reports occasional contractions.  Contractions: Not present. Vag. Bleeding: None.  Movement: Present. Denies leaking of fluid.   The following portions of the patient's history were reviewed and updated as appropriate: allergies, current medications, past family history, past medical history, past social history, past surgical history and problem list.   Objective:   Vitals:   10/14/20 1047  BP: 124/79  Pulse: 97  Weight: 255 lb (115.7 kg)    Fetal Status: Fetal Heart Rate (bpm): 153   Movement: Present     General:  Alert, oriented and cooperative. Patient is in no acute distress.  Skin: Skin is warm and dry. No rash noted.   Cardiovascular: Normal heart rate noted  Respiratory: Normal respiratory effort, no problems with respiration noted  Abdomen: Soft, gravid, appropriate for gestational age.  Pain/Pressure: Present     Pelvic: Cervical exam deferred        Extremities: Normal range of motion.     Mental Status: Normal mood and affect. Normal behavior. Normal judgment and thought content.   Assessment and Plan:  Pregnancy: G4P1021 at [redacted]w[redacted]d 1. Gestational diabetes mellitus (GDM), antepartum, gestational diabetes method of control unspecified --Pt has meter but has not yet had class. Reviewed basics of CBG QID, fasting and PP values. Pt to keep appt for class and bring log at next visit.  --Reviewed ADA diet changes  2. Encounter for supervision of other normal pregnancy in first  trimester --Anticipatory guidance about next visits/weeks of pregnancy given. --Some cramping that resolved with increased PO fluids, PTL/warning signs reviewed --FH a little ahead, growth [place] due to GDM ordered --next visit in 2 weeks  3. [redacted] weeks gestation of pregnancy   Preterm labor symptoms and general obstetric precautions including but not limited to vaginal bleeding, contractions, leaking of fluid and fetal movement were reviewed in detail with the patient. Please refer to After Visit Summary for other counseling recommendations.   No follow-ups on file.  Future Appointments  Date Time Provider Department Center  10/22/2020  9:00 AM [facility] GDM CLASS [facility]  10/28/2020  4:10 PM Constant, Gigi Gin, MD [facility] None    Sharen Counter, CNM

## 2020-10-14 NOTE — Patient Instructions (Signed)
Some natural remedies/prevention to try for bacterial vaginosis: --Take a probiotic tablet/capsule every day for at least 1-2 months.   --Whenever you have symptoms, use boric acid suppositories or tampons with coconut oil and 2-3 drops of tea tree oil vaginally every night for a week.   --Do not use scented soaps/perfumes in the vaginal area, and do not overwash multiple times daily. --Wear breathable cotton underwear and do not wear tight restrictive clothing. --Limit pantyliner use, change your underwear several times daily instead. --Use condoms during intercourse.  

## 2020-10-14 NOTE — Progress Notes (Signed)
Pt states she has been having some tingling/numbness in fingers. Please discuss glucose levels and goals with pt. Glucose logs given to pt today.

## 2020-10-22 ENCOUNTER — Encounter: Payer: Medicaid Other | Attending: Obstetrics and Gynecology | Admitting: Registered"

## 2020-10-27 ENCOUNTER — Ambulatory Visit: Payer: Medicaid Other | Attending: Advanced Practice Midwife

## 2020-10-27 ENCOUNTER — Other Ambulatory Visit: Payer: Self-pay

## 2020-10-27 ENCOUNTER — Encounter: Payer: Self-pay | Admitting: *Deleted

## 2020-10-27 ENCOUNTER — Ambulatory Visit: Payer: Medicaid Other | Admitting: *Deleted

## 2020-10-27 VITALS — BP 133/76 | HR 105

## 2020-10-27 DIAGNOSIS — O99210 Obesity complicating pregnancy, unspecified trimester: Secondary | ICD-10-CM | POA: Diagnosis present

## 2020-10-27 DIAGNOSIS — Z3481 Encounter for supervision of other normal pregnancy, first trimester: Secondary | ICD-10-CM | POA: Insufficient documentation

## 2020-10-27 DIAGNOSIS — O24419 Gestational diabetes mellitus in pregnancy, unspecified control: Secondary | ICD-10-CM | POA: Insufficient documentation

## 2020-10-27 DIAGNOSIS — Z362 Encounter for other antenatal screening follow-up: Secondary | ICD-10-CM

## 2020-10-27 DIAGNOSIS — D563 Thalassemia minor: Secondary | ICD-10-CM

## 2020-10-27 DIAGNOSIS — E669 Obesity, unspecified: Secondary | ICD-10-CM | POA: Diagnosis not present

## 2020-10-27 DIAGNOSIS — Z3A30 30 weeks gestation of pregnancy: Secondary | ICD-10-CM | POA: Diagnosis not present

## 2020-10-27 DIAGNOSIS — Z148 Genetic carrier of other disease: Secondary | ICD-10-CM | POA: Insufficient documentation

## 2020-10-27 DIAGNOSIS — O99213 Obesity complicating pregnancy, third trimester: Secondary | ICD-10-CM | POA: Diagnosis not present

## 2020-10-28 ENCOUNTER — Encounter: Payer: Self-pay | Admitting: Obstetrics

## 2020-10-28 ENCOUNTER — Ambulatory Visit (INDEPENDENT_AMBULATORY_CARE_PROVIDER_SITE_OTHER): Payer: Medicaid Other | Admitting: Obstetrics and Gynecology

## 2020-10-28 ENCOUNTER — Encounter: Payer: Self-pay | Admitting: Obstetrics and Gynecology

## 2020-10-28 ENCOUNTER — Other Ambulatory Visit: Payer: Self-pay | Admitting: *Deleted

## 2020-10-28 VITALS — BP 115/77 | HR 99 | Wt 255.0 lb

## 2020-10-28 DIAGNOSIS — O0990 Supervision of high risk pregnancy, unspecified, unspecified trimester: Secondary | ICD-10-CM

## 2020-10-28 DIAGNOSIS — O99210 Obesity complicating pregnancy, unspecified trimester: Secondary | ICD-10-CM

## 2020-10-28 DIAGNOSIS — O24419 Gestational diabetes mellitus in pregnancy, unspecified control: Secondary | ICD-10-CM

## 2020-10-28 DIAGNOSIS — Z6831 Body mass index (BMI) 31.0-31.9, adult: Secondary | ICD-10-CM

## 2020-10-28 DIAGNOSIS — D563 Thalassemia minor: Secondary | ICD-10-CM

## 2020-10-28 DIAGNOSIS — O24410 Gestational diabetes mellitus in pregnancy, diet controlled: Secondary | ICD-10-CM

## 2020-10-28 NOTE — Progress Notes (Signed)
ROB [redacted]w[redacted]d  GDM teaching was R/S per pt.  CC: Finger tips are still numb.

## 2020-10-28 NOTE — Progress Notes (Signed)
   PRENATAL VISIT NOTE  Subjective:  Karen Mcmahon is a 30 y.o. [phone] at [redacted]w[redacted]d being seen today for ongoing prenatal care.  She is currently monitored for the following issues for this high-risk pregnancy and has Keloid scar of skin; Left arm pain; History of recurrent vaginal discharge; Supervision of high risk pregnancy, antepartum; Maternal obesity affecting pregnancy, antepartum; Alpha thalassemia silent carrier; [redacted] weeks gestation of pregnancy; and GDM (gestational diabetes mellitus) on their problem list.  Patient reports bilateral hand numbness.  Contractions: Irritability. Vag. Bleeding: None.  Movement: Present. Denies leaking of fluid.   The following portions of the patient's history were reviewed and updated as appropriate: allergies, current medications, past family history, past medical history, past social history, past surgical history and problem list.   Objective:   Vitals:   10/28/20 1614  BP: 115/77  Pulse: 99  Weight: 255 lb (115.7 kg)    Fetal Status: Fetal Heart Rate (bpm): 150 Fundal Height: 32 cm Movement: Present     General:  Alert, oriented and cooperative. Patient is in no acute distress.  Skin: Skin is warm and dry. No rash noted.   Cardiovascular: Normal heart rate noted  Respiratory: Normal respiratory effort, no problems with respiration noted  Abdomen: Soft, gravid, appropriate for gestational age.  Pain/Pressure: Present     Pelvic: Cervical exam deferred        Extremities: Normal range of motion.  Edema: None  Mental Status: Normal mood and affect. Normal behavior. Normal judgment and thought content.   Assessment and Plan:  Pregnancy: G4P1021 at [redacted]w[redacted]d 1. Supervision of high risk pregnancy, antepartum Patient is doing well Advised to use bilateral wrist brace for carpal tunnel  2. Maternal obesity affecting pregnancy, antepartum Continue ASA  3. Diet controlled gestational diabetes mellitus (GDM) in third trimester Patient reports all  CBGs within range. She did not bring log or meter for fever Patient scheduled for diabetes education next week The implications and management of diabetes in pregnancy  was discussed in detail with the patient. She was advised that  our goals for her blood glucose values are fasting values of  90-95 or less and two-hour postprandials of 120 or less.  Should her blood glucose values be above these values, her  insulin regimen should be adjusted to help her achieve better  glycemic control. The patient was advised that getting her  fingerstick values as close to these goals as possible would  provide her with the most optimal obstetrical outcome. Follow up growth ultrasound scheduled   Preterm labor symptoms and general obstetric precautions including but not limited to vaginal bleeding, contractions, leaking of fluid and fetal movement were reviewed in detail with the patient. Please refer to After Visit Summary for other counseling recommendations.   Return in about 2 weeks (around 11/11/2020) for in person, ROB, High risk.  Future Appointments  Date Time Provider Department Center  11/05/2020  9:00 AM [facility] GDM CLASS [facility]  11/11/2020 10:35 AM Adam Phenix, MD [facility] None  11/24/2020  3:30 PM [facility] NURSE [facility]  11/24/2020  3:45 PM [facility] US4 [facility]    Catalina Antigua, MD

## 2020-11-05 ENCOUNTER — Encounter (HOSPITAL_COMMUNITY): Payer: Self-pay | Admitting: Obstetrics and Gynecology

## 2020-11-05 ENCOUNTER — Inpatient Hospital Stay (HOSPITAL_COMMUNITY)
Admission: AD | Admit: 2020-11-05 | Discharge: 2020-11-05 | Disposition: A | Discharge status: Home / Self Care | Payer: Medicaid Other | Attending: Obstetrics and Gynecology | Admitting: Obstetrics and Gynecology

## 2020-11-05 ENCOUNTER — Encounter: Payer: Medicaid Other | Attending: Obstetrics and Gynecology | Admitting: Registered"

## 2020-11-05 ENCOUNTER — Other Ambulatory Visit: Payer: Self-pay

## 2020-11-05 DIAGNOSIS — R109 Unspecified abdominal pain: Secondary | ICD-10-CM | POA: Insufficient documentation

## 2020-11-05 DIAGNOSIS — O26893 Other specified pregnancy related conditions, third trimester: Secondary | ICD-10-CM | POA: Diagnosis not present

## 2020-11-05 DIAGNOSIS — Z87891 Personal history of nicotine dependence: Secondary | ICD-10-CM | POA: Diagnosis not present

## 2020-11-05 DIAGNOSIS — Z3689 Encounter for other specified antenatal screening: Secondary | ICD-10-CM

## 2020-11-05 DIAGNOSIS — Z3A31 31 weeks gestation of pregnancy: Secondary | ICD-10-CM | POA: Insufficient documentation

## 2020-11-05 HISTORY — DX: Gestational diabetes mellitus in pregnancy, unspecified control: O24.419

## 2020-11-05 LAB — URINALYSIS, ROUTINE W REFLEX MICROSCOPIC
Bilirubin Urine: NEGATIVE
Glucose, UA: NEGATIVE mg/dL
Hgb urine dipstick: NEGATIVE
Ketones, ur: NEGATIVE mg/dL
Leukocytes,Ua: NEGATIVE
Nitrite: NEGATIVE
Protein, ur: NEGATIVE mg/dL
Specific Gravity, Urine: 1.01 (ref 1.005–1.030)
pH: 6.5 (ref 5.0–8.0)

## 2020-11-05 NOTE — MAU Provider Note (Signed)
History     CSN: [id number]  Arrival date and time: 11/05/20 1009   Event Date/Time   First Provider Initiated Contact with Patient 11/05/20 1055      Chief Complaint  Patient presents with   Abdominal Pain   HPI Karen Mcmahon is a 30 y.o. [id number] at [redacted]w[redacted]d who presents with abdominal pain. Symptoms started last night. Reports intermittent right sided pain that occurs when the baby moves. Feels a fetal part in that location. Can't tell how often it occurs. Rates pain 8/10. Hasn't treated symptoms. Denies n/v/d, fever, dysuria, vaginal bleeding, vaginal discharge, or recent intercourse. Reports good fetal movement.   OB History     Gravida  4   Para  1   Term  1   Preterm      AB  2   Living  1      SAB  1   IAB  1   Ectopic      Multiple      Live Births  1           Past Medical History:  Diagnosis Date   Gestational diabetes    Hx of gonorrhea    Keloid    Left Upper Lateral Earlobe   Pregnant    hx   Trichomonas contact, treated     Past Surgical History:  Procedure Laterality Date   EAR CYST EXCISION Left 04/05/2014   Procedure: EXCISION EAR CYST;  Surgeon: Melvenia Beam, MD;  Location[facility] OR;  Service: ENT;  Laterality: Left;  Excision of keloid   EXTERNAL EAR SURGERY Left 12    Family History  Problem Relation Age of Onset   Kidney disease Mother        was on dialysis, rec'd a new kidney   Diabetes Mother    Hypertension Mother    Cancer Father        pancreatic   Pancreatic cancer Father    Diabetes Maternal Grandmother    Hypertension Maternal Grandmother    Other Neg Hx     Social History   Tobacco Use   Smoking status: Former    Packs/day: 0.50    Years: 2.00    Pack years: 1.00    Types: Cigarettes    Quit date: 03/18/2014    Years since quitting: 6.6   Smokeless tobacco: Never  Vaping Use   Vaping Use: Former  Substance Use Topics   Alcohol use: Not Currently    Comment: 1 mixed drink daily. Not since confirmed  pregnancy   Drug use: Not Currently    Types: Marijuana    Comment: not since + preg    Allergies:  Allergies  Allergen Reactions   Hydrocodone Itching    No medications prior to admission.    Review of Systems  Constitutional: Negative.   Gastrointestinal:  Positive for abdominal pain.  Genitourinary: Negative.   Physical Exam   Blood pressure 121/75, pulse (!) 101, temperature 98.3 F (36.8 C), temperature source Oral, resp. rate 19, height 5\' 7"  (1.702 m), weight 116.2 kg, last menstrual period 03/29/2020, SpO2 99 %.  Physical Exam Constitutional:      General: She is not in acute distress.    Appearance: She is well-developed.  HENT:     Head: Normocephalic and atraumatic.  Pulmonary:     Effort: Pulmonary effort is normal. No respiratory distress.  Abdominal:     Palpations: Abdomen is soft.     Tenderness: There is no  abdominal tenderness.     Hernia: No hernia is present.     Comments: Gravid uterus  Genitourinary:    Comments: Dilation: Closed Effacement (%): Thick Cervical Position: Posterior Station: Ballotable Exam by:: Lyman Bishop, NP  Skin:    General: Skin is warm and dry.  Neurological:     Mental Status: She is alert.  Psychiatric:        Mood and Affect: Mood normal.        Behavior: Behavior normal.   NST:  Baseline: 140 bpm, Variability: Good {> 6 bpm), Accelerations: Reactive, Decelerations: Absent, and no contractions  MAU Course  Procedures Results for orders placed or performed during the hospital encounter of 11/05/20 (from the past 24 hour(s))  Urinalysis, Routine w reflex microscopic Urine, Clean Catch     Status: None   Collection Time: 11/05/20 10:31 AM  Result Value Ref Range   Color, Urine YELLOW YELLOW   APPearance CLEAR CLEAR   Specific Gravity, Urine 1.010 1.005 - 1.030   pH 6.5 5.0 - 8.0   Glucose, UA NEGATIVE NEGATIVE mg/dL   Hgb urine dipstick NEGATIVE NEGATIVE   Bilirubin Urine NEGATIVE NEGATIVE   Ketones, ur  NEGATIVE NEGATIVE mg/dL   Protein, ur NEGATIVE NEGATIVE mg/dL   Nitrite NEGATIVE NEGATIVE   Leukocytes,Ua NEGATIVE NEGATIVE    MDM Reactive tracing. No contractions on monitor & cervix closed/thick. Fetal parts felt in area of pain & pain only occurs with movement. Discussed position changes that can help adjust fetal positioning. Reviewed PTL precautions & reasons to return to MAU.  Assessment and Plan   1. Abdominal pain in pregnancy, third trimester   2. NST (non-stress test) reactive   3. [redacted] weeks gestation of pregnancy    -discharge home -reviewed reasons to return to MAU  Judeth Horn 11/05/2020, 1:05 PM

## 2020-11-05 NOTE — MAU Note (Signed)
Presents stating she's having intermittent sharp abdominal pain that began this morning.  Denies VB or LOF.  Endorses +FM.

## 2020-11-06 ENCOUNTER — Encounter: Payer: Self-pay | Admitting: *Deleted

## 2020-11-11 ENCOUNTER — Other Ambulatory Visit: Payer: Self-pay

## 2020-11-11 ENCOUNTER — Ambulatory Visit (INDEPENDENT_AMBULATORY_CARE_PROVIDER_SITE_OTHER): Payer: Medicaid Other | Admitting: Obstetrics & Gynecology

## 2020-11-11 VITALS — BP 108/69 | HR 105 | Wt 256.0 lb

## 2020-11-11 DIAGNOSIS — O24410 Gestational diabetes mellitus in pregnancy, diet controlled: Secondary | ICD-10-CM

## 2020-11-11 DIAGNOSIS — O0990 Supervision of high risk pregnancy, unspecified, unspecified trimester: Secondary | ICD-10-CM

## 2020-11-11 DIAGNOSIS — O99210 Obesity complicating pregnancy, unspecified trimester: Secondary | ICD-10-CM

## 2020-11-11 NOTE — Progress Notes (Signed)
   PRENATAL VISIT NOTE  Subjective:  Karen Mcmahon is a 30 y.o. [id number] at [redacted]w[redacted]d being seen today for ongoing prenatal care.  She is currently monitored for the following issues for this high-risk pregnancy and has Keloid scar of skin; Left arm pain; History of recurrent vaginal discharge; Supervision of high risk pregnancy, antepartum; Maternal obesity affecting pregnancy, antepartum; Alpha thalassemia silent carrier; and GDM (gestational diabetes mellitus) on their problem list.  Patient reports occasional contractions.  Contractions: Irritability. Vag. Bleeding: None.  Movement: Present. Denies leaking of fluid.   The following portions of the patient's history were reviewed and updated as appropriate: allergies, current medications, past family history, past medical history, past social history, past surgical history and problem list.   Objective:   Vitals:   11/11/20 1051  BP: 108/69  Pulse: (!) 105  Weight: 256 lb (116.1 kg)    Fetal Status: Fetal Heart Rate (bpm): 152   Movement: Present     General:  Alert, oriented and cooperative. Patient is in no acute distress.  Skin: Skin is warm and dry. No rash noted.   Cardiovascular: Normal heart rate noted  Respiratory: Normal respiratory effort, no problems with respiration noted  Abdomen: Soft, gravid, appropriate for gestational age.  Pain/Pressure: Present     Pelvic: Cervical exam deferred        Extremities: Normal range of motion.  Edema: Trace  Mental Status: Normal mood and affect. Normal behavior. Normal judgment and thought content.   Assessment and Plan:  Pregnancy: G4P1021 at [redacted]w[redacted]d 1. Diet controlled gestational diabetes mellitus (GDM) in third trimester FBS sometimes elevated as she snacks at night, urged diet compliance especially avoiding sugar  2. Maternal obesity affecting pregnancy, antepartum Body mass index is 40.1 kg/m.   3. Supervision of high risk pregnancy, antepartum F/u US 2 weeks  Preterm labor  symptoms and general obstetric precautions including but not limited to vaginal bleeding, contractions, leaking of fluid and fetal movement were reviewed in detail with the patient. Please refer to After Visit Summary for other counseling recommendations.   Return in about 2 weeks (around 11/25/2020).  Future Appointments  Date Time Provider Department Center  11/24/2020  3:30 PM [facility] NURSE [facility]  11/24/2020  3:45 PM [facility] US[facility]    Scheryl Darter, MD

## 2020-11-11 NOTE — Progress Notes (Signed)
+   fetal movement. No complaints. Pt has glucose monitor with her to review numbers. States sugar levels have been running within range.

## 2020-11-19 ENCOUNTER — Encounter: Payer: Self-pay | Admitting: Obstetrics and Gynecology

## 2020-11-24 ENCOUNTER — Ambulatory Visit: Payer: Medicaid Other | Attending: Obstetrics and Gynecology | Admitting: *Deleted

## 2020-11-24 ENCOUNTER — Ambulatory Visit: Payer: Medicaid Other

## 2020-11-24 ENCOUNTER — Ambulatory Visit (HOSPITAL_BASED_OUTPATIENT_CLINIC_OR_DEPARTMENT_OTHER): Payer: Medicaid Other

## 2020-11-24 ENCOUNTER — Encounter: Payer: Self-pay | Admitting: *Deleted

## 2020-11-24 ENCOUNTER — Other Ambulatory Visit: Payer: Self-pay

## 2020-11-24 VITALS — BP 132/79 | HR 97

## 2020-11-24 DIAGNOSIS — O99213 Obesity complicating pregnancy, third trimester: Secondary | ICD-10-CM | POA: Insufficient documentation

## 2020-11-24 DIAGNOSIS — D563 Thalassemia minor: Secondary | ICD-10-CM | POA: Diagnosis not present

## 2020-11-24 DIAGNOSIS — O24410 Gestational diabetes mellitus in pregnancy, diet controlled: Secondary | ICD-10-CM

## 2020-11-24 DIAGNOSIS — E669 Obesity, unspecified: Secondary | ICD-10-CM

## 2020-11-24 DIAGNOSIS — O24419 Gestational diabetes mellitus in pregnancy, unspecified control: Secondary | ICD-10-CM | POA: Diagnosis not present

## 2020-11-24 DIAGNOSIS — Z148 Genetic carrier of other disease: Secondary | ICD-10-CM | POA: Insufficient documentation

## 2020-11-24 DIAGNOSIS — Z3A34 34 weeks gestation of pregnancy: Secondary | ICD-10-CM

## 2020-11-24 DIAGNOSIS — Z6831 Body mass index (BMI) 31.0-31.9, adult: Secondary | ICD-10-CM

## 2020-11-24 DIAGNOSIS — O0990 Supervision of high risk pregnancy, unspecified, unspecified trimester: Secondary | ICD-10-CM

## 2020-11-24 DIAGNOSIS — O26893 Other specified pregnancy related conditions, third trimester: Secondary | ICD-10-CM | POA: Insufficient documentation

## 2020-11-24 DIAGNOSIS — O99210 Obesity complicating pregnancy, unspecified trimester: Secondary | ICD-10-CM

## 2020-11-25 ENCOUNTER — Other Ambulatory Visit: Payer: Self-pay | Admitting: *Deleted

## 2020-11-25 ENCOUNTER — Ambulatory Visit (INDEPENDENT_AMBULATORY_CARE_PROVIDER_SITE_OTHER): Payer: Medicaid Other | Admitting: Obstetrics and Gynecology

## 2020-11-25 VITALS — BP 137/87 | HR 94 | Wt 259.0 lb

## 2020-11-25 DIAGNOSIS — O24410 Gestational diabetes mellitus in pregnancy, diet controlled: Secondary | ICD-10-CM

## 2020-11-25 DIAGNOSIS — O0990 Supervision of high risk pregnancy, unspecified, unspecified trimester: Secondary | ICD-10-CM

## 2020-11-25 DIAGNOSIS — O3663X1 Maternal care for excessive fetal growth, third trimester, fetus 1: Secondary | ICD-10-CM

## 2020-11-25 DIAGNOSIS — Z6831 Body mass index (BMI) 31.0-31.9, adult: Secondary | ICD-10-CM

## 2020-11-25 DIAGNOSIS — Z3A34 34 weeks gestation of pregnancy: Secondary | ICD-10-CM

## 2020-11-25 DIAGNOSIS — D563 Thalassemia minor: Secondary | ICD-10-CM

## 2020-11-25 DIAGNOSIS — O24419 Gestational diabetes mellitus in pregnancy, unspecified control: Secondary | ICD-10-CM

## 2020-11-25 DIAGNOSIS — O99210 Obesity complicating pregnancy, unspecified trimester: Secondary | ICD-10-CM

## 2020-11-25 NOTE — Progress Notes (Signed)
   PRENATAL VISIT NOTE  Subjective:  Karen Mcmahon is a 30 y.o. [id number] at [redacted]w[redacted]d being seen today for ongoing prenatal care.  She is currently monitored for the following issues for this high-risk pregnancy and has Keloid scar of skin; Left arm pain; History of recurrent vaginal discharge; Supervision of high risk pregnancy, antepartum; Maternal obesity affecting pregnancy, antepartum; Alpha thalassemia silent carrier; GDM (gestational diabetes mellitus); [redacted] weeks gestation of pregnancy; and LGA (large for gestational age) fetus affecting management of mother, third trimester, fetus 1 on their problem list.  Patient doing well with no acute concerns today. She reports no complaints.  Contractions: Irregular. Vag. Bleeding: None.  Movement: Present. Denies leaking of fluid.   The following portions of the patient's history were reviewed and updated as appropriate: allergies, current medications, past family history, past medical history, past social history, past surgical history and problem list. Problem list updated.  Objective:   Vitals:   11/25/20 1014  BP: 137/87  Pulse: 94  Weight: 259 lb (117.5 kg)    Fetal Status: Fetal Heart Rate (bpm): 123 Fundal Height: 37 cm Movement: Present     General:  Alert, oriented and cooperative. Patient is in no acute distress.  Skin: Skin is warm and dry. No rash noted.   Cardiovascular: Normal heart rate noted  Respiratory: Normal respiratory effort, no problems with respiration noted  Abdomen: Soft, gravid, appropriate for gestational age.  Pain/Pressure: Absent     Pelvic: Cervical exam deferred        Extremities: Normal range of motion.  Edema: Trace  Mental Status:  Normal mood and affect. Normal behavior. Normal judgment and thought content.   Assessment and Plan:  Pregnancy: G4P1021 at [redacted]w[redacted]d  1. Alpha thalassemia silent carrier   2. Maternal obesity affecting pregnancy, antepartum Weight is stable  3. Supervision of high risk  pregnancy, antepartum Continue routine care  4. Diet controlled gestational diabetes mellitus (GDM) in third trimester Pt did not bring in blood sugars and could not recall the values.  Pt advised to bring in blood sugars at the next visit  5. [redacted] weeks gestation of pregnancy   6. LGA (large for gestational age) fetus affecting management of mother, third trimester, fetus 1 EFW 84%, AC 99% per MFM repeat growth in 4 weeks  Preterm labor symptoms and general obstetric precautions including but not limited to vaginal bleeding, contractions, leaking of fluid and fetal movement were reviewed in detail with the patient.  Please refer to After Visit Summary for other counseling recommendations.   Return in about 2 weeks (around 12/09/2020) for [facility], in person, 36 weeks swabs.   Mariel Aloe, MD Faculty Attending Center for [facility]

## 2020-12-03 ENCOUNTER — Inpatient Hospital Stay (HOSPITAL_COMMUNITY)
Admission: AD | Admit: 2020-12-03 | Discharge: 2020-12-03 | Disposition: A | Discharge status: Home / Self Care | Payer: Medicaid Other | Attending: Obstetrics & Gynecology | Admitting: Obstetrics & Gynecology

## 2020-12-03 ENCOUNTER — Encounter (HOSPITAL_COMMUNITY): Payer: Self-pay | Admitting: Obstetrics & Gynecology

## 2020-12-03 DIAGNOSIS — O3663X0 Maternal care for excessive fetal growth, third trimester, not applicable or unspecified: Secondary | ICD-10-CM | POA: Diagnosis not present

## 2020-12-03 DIAGNOSIS — Z87891 Personal history of nicotine dependence: Secondary | ICD-10-CM | POA: Diagnosis not present

## 2020-12-03 DIAGNOSIS — Z3A35 35 weeks gestation of pregnancy: Secondary | ICD-10-CM | POA: Diagnosis not present

## 2020-12-03 DIAGNOSIS — O99213 Obesity complicating pregnancy, third trimester: Secondary | ICD-10-CM | POA: Insufficient documentation

## 2020-12-03 DIAGNOSIS — O0990 Supervision of high risk pregnancy, unspecified, unspecified trimester: Secondary | ICD-10-CM

## 2020-12-03 DIAGNOSIS — O4703 False labor before 37 completed weeks of gestation, third trimester: Secondary | ICD-10-CM | POA: Diagnosis not present

## 2020-12-03 DIAGNOSIS — O99210 Obesity complicating pregnancy, unspecified trimester: Secondary | ICD-10-CM

## 2020-12-03 DIAGNOSIS — D563 Thalassemia minor: Secondary | ICD-10-CM

## 2020-12-03 DIAGNOSIS — Z3689 Encounter for other specified antenatal screening: Secondary | ICD-10-CM

## 2020-12-03 DIAGNOSIS — O24410 Gestational diabetes mellitus in pregnancy, diet controlled: Secondary | ICD-10-CM

## 2020-12-03 LAB — URINALYSIS, ROUTINE W REFLEX MICROSCOPIC
Bilirubin Urine: NEGATIVE
Glucose, UA: NEGATIVE mg/dL
Hgb urine dipstick: NEGATIVE
Ketones, ur: NEGATIVE mg/dL
Leukocytes,Ua: NEGATIVE
Nitrite: NEGATIVE
Protein, ur: 30 mg/dL — AB
Specific Gravity, Urine: 1.017 (ref 1.005–1.030)
pH: 6 (ref 5.0–8.0)

## 2020-12-03 MED ORDER — NIFEDIPINE 10 MG PO CAPS
10.0000 mg | ORAL_CAPSULE | ORAL | Status: AC
  Administered 2020-12-03: 10 mg via ORAL
  Filled 2020-12-03: qty 1

## 2020-12-03 NOTE — MAU Note (Signed)
.  Karen Mcmahon is a 30 y.o. at [redacted]w[redacted]d here in MAU reporting: ctx that are 3 minutes apart that started around 1600. Denies VB, LOF, or recent intercourse. Endorses good fetal movement.   Pain score: 10 Vitals:   12/03/20 1848 12/03/20 1849  BP: 138/85   Pulse: (!) 103 98  Resp: 18   Temp: 98.4 F (36.9 C)   SpO2: 99%      FHT:137 Lab orders placed from triage:  UA

## 2020-12-03 NOTE — MAU Provider Note (Signed)
History     CSN: [id number]  Arrival date and time: 12/03/20 1817   Event Date/Time   First Provider Initiated Contact with Patient 12/03/20 1903      Chief Complaint  Patient presents with   Contractions   30 y.o. [id number] $[id number]'@[id number]$ .4 wks presenting with ctx. Reports onset last night but became more consistent around 4pm. Reports frequency q2-3 min. Denies VB or LOF. +FM. Her pregnancy is complicated by GDM, obesity, alpha thal carrier, and LGA fetus.    OB History     Gravida  4   Para  1   Term  1   Preterm      AB  2   Living  1      SAB  1   IAB  1   Ectopic      Multiple      Live Births  1           Past Medical History:  Diagnosis Date   Gestational diabetes    Hx of gonorrhea    Keloid    Left Upper Lateral Earlobe   Pregnant    hx   Trichomonas contact, treated     Past Surgical History:  Procedure Laterality Date   EAR CYST EXCISION Left 04/05/2014   Procedure: EXCISION EAR CYST;  Surgeon: Ruby Cola, MD;  Location: [facility];  Service: ENT;  Laterality: Left;  Excision of keloid   EXTERNAL EAR SURGERY Left 12    Family History  Problem Relation Age of Onset   Kidney disease Mother        was on dialysis, rec'd a new kidney   Diabetes Mother    Hypertension Mother    Cancer Father        pancreatic   Pancreatic cancer Father    Diabetes Maternal Grandmother    Hypertension Maternal Grandmother    Other Neg Hx     Social History   Tobacco Use   Smoking status: Former    Packs/day: 0.50    Years: 2.00    Pack years: 1.00    Types: Cigarettes    Quit date: 03/18/2014    Years since quitting: 6.7   Smokeless tobacco: Never  Vaping Use   Vaping Use: Former  Substance Use Topics   Alcohol use: Not Currently    Comment: 1 mixed drink daily. Not since confirmed pregnancy   Drug use: Not Currently    Types: Marijuana    Comment: not since + preg    Allergies:  Allergies  Allergen Reactions   Hydrocodone Itching     Medications Prior to Admission  Medication Sig Dispense Refill Last Dose   Blood Glucose Monitoring Suppl (ACCU-CHEK GUIDE) w/Device KIT 1 Device by Does not apply route 4 (four) times daily. 1 kit 0 12/03/2020   glucose blood (ACCU-CHEK GUIDE) test strip Use to check blood sugars four times a day was instructed 50 each 12 12/03/2020   Prenatal Vit-Fe Fumarate-FA (PREPLUS) 27-1 MG TABS Take 1 tablet by mouth daily. 30 tablet 13 12/03/2020   Accu-Chek Softclix Lancets lancets 4 times daily 100 each 12    Prenat-Fe Carbonyl-FA-Omega 3 (ONE-A-DAY WOMENS PRENATAL 1) 28-0.8-235 MG CAPS Take by mouth.       Review of Systems  Gastrointestinal:  Positive for abdominal pain.  Genitourinary:  Negative for vaginal bleeding and vaginal discharge.  Physical Exam   Blood pressure 138/85, pulse 98, temperature 98.4 F (36.9 C), temperature source Oral, resp. rate 18,  last menstrual period 03/29/2020, SpO2 99 %.  Physical Exam Vitals and nursing note reviewed. Exam conducted with a chaperone present.  Constitutional:      General: She is not in acute distress.    Appearance: Normal appearance.  HENT:     Head: Normocephalic.  Cardiovascular:     Rate and Rhythm: Normal rate.  Pulmonary:     Effort: Pulmonary effort is normal. No respiratory distress.  Genitourinary:    Comments: VE: closed/thick Musculoskeletal:        General: Normal range of motion.  Skin:    General: Skin is warm and dry.  Neurological:     General: No focal deficit present.     Mental Status: She is alert and oriented to person, place, and time.  Psychiatric:        Mood and Affect: Mood normal.        Behavior: Behavior normal.  EFM: 140 bpm, mod variability, + accels, no decels Toco: q11-13 w/UI  No results found for this or any previous visit (from the past 24 hour(s)).  MAU Course  Procedures Procardia x1  MDM Pt unable to give urine specimen. Pt requesting discharge home about 30 min after Procardia  dose. Ctx rare on toco, no signs of PTL. Stable for discharge home.   Assessment and Plan  [redacted] weeks gestation NST reactive Preterm contractions  Discharge home Follow up at [facility] as scheduled PTL precautions  Allergies as of 12/03/2020       Reactions   Hydrocodone Itching        Medication List     TAKE these medications    Accu-Chek Guide test strip Generic drug: glucose blood Use to check blood sugars four times a day was instructed   Accu-Chek Guide w/Device Kit 1 Device by Does not apply route 4 (four) times daily.   Accu-Chek Softclix Lancets lancets 4 times daily   One-A-Day Womens Prenatal 1 28-0.8-235 MG Caps Take by mouth.   PrePLUS 27-1 MG Tabs Take 1 tablet by mouth daily.       Julianne Handler, CNM 12/03/2020, 7:51 PM

## 2020-12-04 ENCOUNTER — Telehealth: Payer: Self-pay

## 2020-12-04 NOTE — Telephone Encounter (Signed)
Telephone from patient- she reported going to restroom a couple hours ago and noticed some bleeding after wiping. She reports + fetal movement at this time. Patient was seen yesterday at MAU and cervix was check. She was advised she may have some spotting after having her cervix check. I have advised patient to monitored her bleeding. It bleeding becomes heavy soaking 1-2 pads per hour or bright red blood please go directly to MAU for follow up care follow up care. Patient voice understanding.

## 2020-12-09 ENCOUNTER — Other Ambulatory Visit (HOSPITAL_COMMUNITY)
Admission: RE | Admit: 2020-12-09 | Discharge: 2020-12-09 | Disposition: A | Discharge status: Home / Self Care | Payer: Medicaid Other | Source: Ambulatory Visit | Attending: Obstetrics & Gynecology | Admitting: Obstetrics & Gynecology

## 2020-12-09 ENCOUNTER — Encounter: Payer: Self-pay | Admitting: Obstetrics & Gynecology

## 2020-12-09 ENCOUNTER — Ambulatory Visit (INDEPENDENT_AMBULATORY_CARE_PROVIDER_SITE_OTHER): Payer: Medicaid Other | Admitting: Obstetrics & Gynecology

## 2020-12-09 ENCOUNTER — Other Ambulatory Visit: Payer: Self-pay

## 2020-12-09 VITALS — BP 116/78 | HR 109 | Wt 258.7 lb

## 2020-12-09 DIAGNOSIS — O0990 Supervision of high risk pregnancy, unspecified, unspecified trimester: Secondary | ICD-10-CM | POA: Diagnosis present

## 2020-12-09 DIAGNOSIS — Z3A36 36 weeks gestation of pregnancy: Secondary | ICD-10-CM

## 2020-12-09 DIAGNOSIS — O3663X1 Maternal care for excessive fetal growth, third trimester, fetus 1: Secondary | ICD-10-CM | POA: Insufficient documentation

## 2020-12-09 DIAGNOSIS — O24410 Gestational diabetes mellitus in pregnancy, diet controlled: Secondary | ICD-10-CM

## 2020-12-09 LAB — OB RESULTS CONSOLE GC/CHLAMYDIA: Gonorrhea: NEGATIVE

## 2020-12-09 NOTE — Patient Instructions (Signed)
Return to office for any scheduled appointments. Call the office or go to the MAU at [facility] [place] if:  You begin to have strong, frequent contractions  Your water breaks.  Sometimes it is a big gush of fluid, sometimes it is just a trickle that keeps getting your panties wet or running down your legs  You have vaginal bleeding.  It is normal to have a small amount of spotting if your cervix was checked.   You do not feel your baby moving like normal.  If you do not, get something to eat and drink and lay down and focus on feeling your baby move.   If your baby is still not moving like normal, you should call the office or go to MAU.  Any other obstetric concerns.   

## 2020-12-09 NOTE — Progress Notes (Signed)
PRENATAL VISIT NOTE  Subjective:  Karen Mcmahon is a 30 y.o. [phone] at [redacted]w[redacted]d being seen today for ongoing prenatal care.  She is currently monitored for the following issues for this high-risk pregnancy and has Keloid scar of skin; Left arm pain; History of recurrent vaginal discharge; Supervision of high risk pregnancy, antepartum; Maternal obesity affecting pregnancy, antepartum; Alpha thalassemia silent carrier; GDM (gestational diabetes mellitus); and LGA (large for gestational age) fetus affecting management of mother, third trimester, fetus 1 on their problem list.  Patient reports occasional contractions.  Contractions: Irregular. Vag. Bleeding: None.  Movement: Present. Denies leaking of fluid.   The following portions of the patient's history were reviewed and updated as appropriate: allergies, current medications, past family history, past medical history, past social history, past surgical history and problem list.   Objective:   Vitals:   12/09/20 1355  BP: 116/78  Pulse: (!) 109  Weight: 258 lb 11.2 oz (117.3 kg)    Fetal Status: Fetal Heart Rate (bpm): 149   Movement: Present  Presentation: Vertex  General:  Alert, oriented and cooperative. Patient is in no acute distress.  Skin: Skin is warm and dry. No rash noted.   Cardiovascular: Normal heart rate noted  Respiratory: Normal respiratory effort, no problems with respiration noted  Abdomen: Soft, gravid, appropriate for gestational age.  Pain/Pressure: Present     Pelvic: Cervical exam performed in the presence of a chaperone Dilation: Closed Effacement (%): Thick Station: Ballotable  Extremities: Normal range of motion.  Edema: Trace  Mental Status: Normal mood and affect. Normal behavior. Normal judgment and thought content.   [place] MFM OB FOLLOW UP  Result Date: 11/24/2020 ----------------------------------------------------------------------  OBSTETRICS REPORT                       (Signed Final 11/24/2020 04:21  pm) ---------------------------------------------------------------------- Patient Info  ID #:       [id number]                          D.O.B.:  [birth year] (30 yrs)  Name:       Karen Mcmahon                   Visit Date: 11/24/2020 03:37 pm ---------------------------------------------------------------------- Performed By  Attending:        Noralee Space MD        Ref. Address:     [address]                                                             [phone]  [place]                                                             [address]  Performed By:     Clayton Lefort RDMS       [facility]  Referred By:      [facility] ---------------------------------------------------------------------- Orders  #  Description                           Code        Ordered By  1  [place] MFM OB FOLLOW UP                   57322.02    Noralee Space ----------------------------------------------------------------------  #  Order #                     Accession #                Episode #  1  [id number]                   [id number]                 [id number] ---------------------------------------------------------------------- Indications  Gestational diabetes in pregnancy, diet        O24.410  controlled  [redacted] weeks gestation of pregnancy                Z3A.34  Obesity complicating pregnancy, third          O99.213  trimester  Genetic carrier (Silent Alpha Thal)            Z14.8  LR NIPS ---------------------------------------------------------------------- Fetal Evaluation  Num Of Fetuses:         1  Fetal Heart Rate(bpm):  126  Cardiac Activity:       Observed  Presentation:            Cephalic  Placenta:               Anterior  P. Cord Insertion:      Previously Visualized  Amniotic  Fluid  AFI FV:      Within normal limits  AFI Sum(cm)     %Tile       Largest Pocket(cm)  12.36           37          5.88  RUQ(cm)       RLQ(cm)       LUQ(cm)        LLQ(cm)  5.33          0             1.15           5.88 ---------------------------------------------------------------------- Biometry  BPD:      82.3  mm     G. Age:  33w 1d         13  %    CI:        73.21   %    70 - 86                                                          FL/HC:      23.7   %    20.1 - 22.3  HC:      305.7  mm     G. Age:  34w 0d          9  %    HC/AC:      0.90        0.93 - 1.11  AC:      338.6  mm     G. Age:  37w 5d       > 99  %    FL/BPD:     88.1   %    71 - 87  FL:       72.5  mm     G. Age:  37w 1d         94  %    FL/AC:      21.4   %    20 - 24  Est. FW:    2984  gm      6 lb 9 oz     94  % ---------------------------------------------------------------------- OB History  Blood Type:   B+  Gravidity:    4         Term:   1        Prem:   0        SAB:   1  TOP:          1       Ectopic:  0        Living: 1 ---------------------------------------------------------------------- Gestational Age  LMP:           34w 2d        Date:  03/29/20                 EDD:   01/03/21  U/S Today:     35w 4d                                        EDD:   12/25/20  Best:          Kizzie Ide  4d     Det. By:  U/S C R L  (05/28/20)    EDD:   01/01/21 ---------------------------------------------------------------------- Anatomy  Cranium:               Appears normal         LVOT:                   Previously seen  Cavum:                 Previously seen        Aortic Arch:            Previously seen  Ventricles:            Appears normal         Ductal Arch:            Previously seen  Choroid Plexus:        Previously seen        Diaphragm:              Previously seen  Cerebellum:            Previously seen        Stomach:                 Appears normal, left                                                                        sided  Posterior Fossa:       Previously seen        Abdomen:                Previously seen  Nuchal Fold:           Previously seen        Abdominal Wall:         Previously seen  Face:                  Orbits and profile     Cord Vessels:           Previously seen                         previously seen  Lips:                  Previously seen        Kidneys:                Appear normal  Palate:                Previously seen        Bladder:                Appears normal  Thoracic:              Previously seen        Spine:                  Previously seen  Heart:                 Appears normal         Upper Extremities:  Previously seen                         (4CH, axis, and                         situs)  RVOT:                  Previously seen        Lower Extremities:      Previously seen  Other:  Female gender previously seen. Nasal Bone, Lenses, 3VV and 3VTV          previously visualized. Heels/feet and open hand/5th digit previously          visualized. ---------------------------------------------------------------------- Cervix Uterus Adnexa  Cervix  Not visualized (advanced GA >24wks)  Right Ovary  Not visualized.  Left Ovary  Not visualized. ---------------------------------------------------------------------- Impression  Gestational diabetes.  Patient reports her diabetes is well  controlled on diet.  Blood pressure today at her office is 132/79 mmHg.  On today's ultrasound, the estimated fetal weight is at the  94th percentile and the abdominal circumference  measurement is at the 99th percentile.  Amniotic fluid is  normal and good fetal activity seen ---------------------------------------------------------------------- Recommendations  -An appointment was made for her to return in 4 weeks for  fetal growth assessment. ----------------------------------------------------------------------                   Noralee Space, MD Electronically Signed Final Report   11/24/2020 04:21 pm ----------------------------------------------------------------------   Assessment and Plan:  Pregnancy: [id number] at [redacted]w[redacted]d 1. Diet controlled gestational diabetes mellitus (GDM) in third trimester CBGs reviewed.  Had elevated PP in 130s-130s on 12/08/20, otherwise all all in range. Continue diet and exercise control. Scheduled for growth scan on 12/22/20. Plan for delivery aaaaaround 39-40 weeks (or earlier if needed).  2. LGA (large for gestational age) fetus affecting management of mother, third trimester, fetus 1 Follow up growth scans  3. [redacted] weeks gestation of pregnancy 4. Supervision of high risk pregnancy, antepartum Pelvic cultures done today, will follow up results and manage accordingly. - Culture, beta strep (group b only) - Cervicovaginal ancillary only( [place])  Preterm labor symptoms and general obstetric precautions including but not limited to vaginal bleeding, contractions, leaking of fluid and fetal movement were reviewed in detail with the patient. Please refer to After Visit Summary for other counseling recommendations.   Return in about 1 week (around 12/16/2020) for OFFICE OB VISIT (MD only).  Future Appointments  Date Time Provider Department Center  12/22/2020  9:30 AM [facility] NURSE [facility]  12/22/2020  9:45 AM [facility] US4 [facility]    Jaynie Collins, MD

## 2020-12-11 LAB — CERVICOVAGINAL ANCILLARY ONLY
Chlamydia: NEGATIVE
Comment: NEGATIVE
Comment: NEGATIVE
Comment: NORMAL
Neisseria Gonorrhea: NEGATIVE
Trichomonas: NEGATIVE

## 2020-12-13 LAB — CULTURE, BETA STREP (GROUP B ONLY): Strep Gp B Culture: NEGATIVE

## 2020-12-16 ENCOUNTER — Ambulatory Visit (INDEPENDENT_AMBULATORY_CARE_PROVIDER_SITE_OTHER): Payer: Medicaid Other | Admitting: Obstetrics and Gynecology

## 2020-12-16 ENCOUNTER — Other Ambulatory Visit: Payer: Self-pay

## 2020-12-16 VITALS — BP 119/82 | HR 93 | Wt 261.0 lb

## 2020-12-16 DIAGNOSIS — O24410 Gestational diabetes mellitus in pregnancy, diet controlled: Secondary | ICD-10-CM

## 2020-12-16 DIAGNOSIS — D563 Thalassemia minor: Secondary | ICD-10-CM

## 2020-12-16 DIAGNOSIS — O3663X1 Maternal care for excessive fetal growth, third trimester, fetus 1: Secondary | ICD-10-CM

## 2020-12-16 DIAGNOSIS — Z3A37 37 weeks gestation of pregnancy: Secondary | ICD-10-CM | POA: Insufficient documentation

## 2020-12-16 DIAGNOSIS — O0990 Supervision of high risk pregnancy, unspecified, unspecified trimester: Secondary | ICD-10-CM

## 2020-12-16 NOTE — Progress Notes (Signed)
   PRENATAL VISIT NOTE  Subjective:  Karen Mcmahon is a 30 y.o. [phone] at [redacted]w[redacted]d being seen today for ongoing prenatal care.  She is currently monitored for the following issues for this high-risk pregnancy and has Keloid scar of skin; Left arm pain; History of recurrent vaginal discharge; Supervision of high risk pregnancy, antepartum; Maternal obesity affecting pregnancy, antepartum; Alpha thalassemia silent carrier; GDM (gestational diabetes mellitus); LGA (large for gestational age) fetus affecting management of mother, third trimester, fetus 1; and [redacted] weeks gestation of pregnancy on their problem list.  Patient doing well with no acute concerns today. She reports backache and and pelvic pressure .  Contractions: Irregular. Vag. Bleeding: None.  Movement: Present. Denies leaking of fluid.   The following portions of the patient's history were reviewed and updated as appropriate: allergies, current medications, past family history, past medical history, past social history, past surgical history and problem list. Problem list updated.  Objective:   Vitals:   12/16/20 1609  BP: 119/82  Pulse: 93  Weight: 261 lb (118.4 kg)    Fetal Status: Fetal Heart Rate (bpm): 140 Fundal Height: 39 cm Movement: Present     General:  Alert, oriented and cooperative. Patient is in no acute distress.  Skin: Skin is warm and dry. No rash noted.   Cardiovascular: Normal heart rate noted  Respiratory: Normal respiratory effort, no problems with respiration noted  Abdomen: Soft, gravid, appropriate for gestational age.  Pain/Pressure: Present     Pelvic: Cervical exam deferred        Extremities: Normal range of motion.  Edema: Trace  Mental Status:  Normal mood and affect. Normal behavior. Normal judgment and thought content.   Assessment and Plan:  Pregnancy: G4P1021 at [redacted]w[redacted]d  1. [redacted] weeks gestation of pregnancy   2. Diet controlled gestational diabetes mellitus (GDM) in third trimester FBS:  78-92 PPBS: 98-130  Overall good control, will schedule IOL at 39 weeks for A1 GDM and LGA fetus  3. Supervision of high risk pregnancy, antepartum Continue routine care  4. LGA (large for gestational age) fetus affecting management of mother, third trimester, fetus 1 Growth scan 12/22/20, plan for IOL at 39 weeks  5. Alpha thalassemia silent carrier   Term labor symptoms and general obstetric precautions including but not limited to vaginal bleeding, contractions, leaking of fluid and fetal movement were reviewed in detail with the patient.  Please refer to After Visit Summary for other counseling recommendations.   Return in about 1 week (around 12/23/2020) for ROB, in person.   Mariel Aloe, MD Faculty Attending Center for [facility]

## 2020-12-16 NOTE — Addendum Note (Signed)
Addended by: Warden Fillers on: 12/16/2020 05:21 PM   Modules accepted: Orders, SmartSet

## 2020-12-17 ENCOUNTER — Other Ambulatory Visit: Payer: Self-pay | Admitting: Advanced Practice Midwife

## 2020-12-22 ENCOUNTER — Ambulatory Visit: Payer: Medicaid Other | Admitting: *Deleted

## 2020-12-22 ENCOUNTER — Ambulatory Visit: Payer: Medicaid Other | Attending: Obstetrics and Gynecology

## 2020-12-22 ENCOUNTER — Encounter: Payer: Self-pay | Admitting: *Deleted

## 2020-12-22 ENCOUNTER — Other Ambulatory Visit: Payer: Self-pay

## 2020-12-22 VITALS — BP 142/92 | HR 89

## 2020-12-22 DIAGNOSIS — D563 Thalassemia minor: Secondary | ICD-10-CM | POA: Diagnosis present

## 2020-12-22 DIAGNOSIS — O24410 Gestational diabetes mellitus in pregnancy, diet controlled: Secondary | ICD-10-CM | POA: Diagnosis not present

## 2020-12-22 DIAGNOSIS — Z148 Genetic carrier of other disease: Secondary | ICD-10-CM | POA: Diagnosis not present

## 2020-12-22 DIAGNOSIS — O0990 Supervision of high risk pregnancy, unspecified, unspecified trimester: Secondary | ICD-10-CM | POA: Insufficient documentation

## 2020-12-22 DIAGNOSIS — O99210 Obesity complicating pregnancy, unspecified trimester: Secondary | ICD-10-CM

## 2020-12-22 DIAGNOSIS — O0993 Supervision of high risk pregnancy, unspecified, third trimester: Secondary | ICD-10-CM | POA: Diagnosis not present

## 2020-12-22 DIAGNOSIS — O24419 Gestational diabetes mellitus in pregnancy, unspecified control: Secondary | ICD-10-CM | POA: Diagnosis present

## 2020-12-22 DIAGNOSIS — Z6831 Body mass index (BMI) 31.0-31.9, adult: Secondary | ICD-10-CM

## 2020-12-22 DIAGNOSIS — E669 Obesity, unspecified: Secondary | ICD-10-CM | POA: Diagnosis not present

## 2020-12-22 DIAGNOSIS — O99213 Obesity complicating pregnancy, third trimester: Secondary | ICD-10-CM | POA: Insufficient documentation

## 2020-12-22 DIAGNOSIS — Z3A38 38 weeks gestation of pregnancy: Secondary | ICD-10-CM | POA: Insufficient documentation

## 2020-12-23 ENCOUNTER — Encounter (HOSPITAL_COMMUNITY): Payer: Self-pay | Admitting: Obstetrics & Gynecology

## 2020-12-23 ENCOUNTER — Telehealth (INDEPENDENT_AMBULATORY_CARE_PROVIDER_SITE_OTHER): Payer: Medicaid Other | Admitting: Obstetrics & Gynecology

## 2020-12-23 ENCOUNTER — Inpatient Hospital Stay (EMERGENCY_DEPARTMENT_HOSPITAL)
Admission: AD | Admit: 2020-12-23 | Discharge: 2020-12-23 | Disposition: A | Discharge status: Home / Self Care | Payer: Medicaid Other | Source: Home / Self Care | Attending: Obstetrics & Gynecology | Admitting: Obstetrics & Gynecology

## 2020-12-23 ENCOUNTER — Telehealth (HOSPITAL_COMMUNITY): Payer: Self-pay | Admitting: *Deleted

## 2020-12-23 ENCOUNTER — Other Ambulatory Visit: Payer: Self-pay

## 2020-12-23 VITALS — BP 151/100

## 2020-12-23 DIAGNOSIS — O26893 Other specified pregnancy related conditions, third trimester: Secondary | ICD-10-CM | POA: Insufficient documentation

## 2020-12-23 DIAGNOSIS — O99210 Obesity complicating pregnancy, unspecified trimester: Secondary | ICD-10-CM

## 2020-12-23 DIAGNOSIS — R030 Elevated blood-pressure reading, without diagnosis of hypertension: Secondary | ICD-10-CM | POA: Insufficient documentation

## 2020-12-23 DIAGNOSIS — O10913 Unspecified pre-existing hypertension complicating pregnancy, third trimester: Secondary | ICD-10-CM | POA: Insufficient documentation

## 2020-12-23 DIAGNOSIS — Z3A38 38 weeks gestation of pregnancy: Secondary | ICD-10-CM | POA: Diagnosis not present

## 2020-12-23 DIAGNOSIS — D563 Thalassemia minor: Secondary | ICD-10-CM

## 2020-12-23 DIAGNOSIS — O99013 Anemia complicating pregnancy, third trimester: Secondary | ICD-10-CM

## 2020-12-23 DIAGNOSIS — O0990 Supervision of high risk pregnancy, unspecified, unspecified trimester: Secondary | ICD-10-CM

## 2020-12-23 DIAGNOSIS — Z79899 Other long term (current) drug therapy: Secondary | ICD-10-CM | POA: Insufficient documentation

## 2020-12-23 DIAGNOSIS — O1213 Gestational proteinuria, third trimester: Secondary | ICD-10-CM | POA: Insufficient documentation

## 2020-12-23 DIAGNOSIS — Z87891 Personal history of nicotine dependence: Secondary | ICD-10-CM | POA: Insufficient documentation

## 2020-12-23 DIAGNOSIS — O99213 Obesity complicating pregnancy, third trimester: Secondary | ICD-10-CM

## 2020-12-23 DIAGNOSIS — Z3689 Encounter for other specified antenatal screening: Secondary | ICD-10-CM

## 2020-12-23 DIAGNOSIS — O24410 Gestational diabetes mellitus in pregnancy, diet controlled: Secondary | ICD-10-CM

## 2020-12-23 DIAGNOSIS — E669 Obesity, unspecified: Secondary | ICD-10-CM

## 2020-12-23 LAB — URINALYSIS, ROUTINE W REFLEX MICROSCOPIC
Bilirubin Urine: NEGATIVE
Glucose, UA: NEGATIVE mg/dL
Hgb urine dipstick: NEGATIVE
Ketones, ur: NEGATIVE mg/dL
Leukocytes,Ua: NEGATIVE
Nitrite: NEGATIVE
Protein, ur: 30 mg/dL — AB
Specific Gravity, Urine: 1.011 (ref 1.005–1.030)
pH: 6 (ref 5.0–8.0)

## 2020-12-23 LAB — COMPREHENSIVE METABOLIC PANEL
ALT: 14 U/L (ref 0–44)
AST: 21 U/L (ref 15–41)
Albumin: 2.5 g/dL — ABNORMAL LOW (ref 3.5–5.0)
Alkaline Phosphatase: 219 U/L — ABNORMAL HIGH (ref 38–126)
Anion gap: 7 (ref 5–15)
BUN: 5 mg/dL — ABNORMAL LOW (ref 6–20)
CO2: 19 mmol/L — ABNORMAL LOW (ref 22–32)
Calcium: 8.6 mg/dL — ABNORMAL LOW (ref 8.9–10.3)
Chloride: 109 mmol/L (ref 98–111)
Creatinine, Ser: 0.74 mg/dL (ref 0.44–1.00)
GFR, Estimated: >60 mL/min (ref >60)
Glucose, Bld: 90 mg/dL (ref 70–99)
Potassium: 3.7 mmol/L (ref 3.5–5.1)
Sodium: 135 mmol/L (ref 135–145)
Total Bilirubin: 0.9 mg/dL (ref 0.3–1.2)
Total Protein: 5.3 g/dL — ABNORMAL LOW (ref 6.5–8.1)

## 2020-12-23 LAB — CBC
HCT: 35.5 % — ABNORMAL LOW (ref 36.0–46.0)
Hemoglobin: 11.7 g/dL — ABNORMAL LOW (ref 12.0–15.0)
MCH: 29 pg (ref 26.0–34.0)
MCHC: 33 g/dL (ref 30.0–36.0)
MCV: 87.9 fL (ref 80.0–100.0)
Platelets: 147 10*3/uL — ABNORMAL LOW (ref 150–400)
RBC: 4.04 MIL/uL (ref 3.87–5.11)
RDW: 14.1 % (ref 11.5–15.5)
WBC: 5.5 10*3/uL (ref 4.0–10.5)
nRBC: 0 % (ref 0.0–0.2)

## 2020-12-23 LAB — PROTEIN / CREATININE RATIO, URINE
Creatinine, Urine: 113.38 mg/dL
Protein Creatinine Ratio: 0.49 mg/mg{Cre} — ABNORMAL HIGH (ref 0.00–0.15)
Total Protein, Urine: 55 mg/dL

## 2020-12-23 NOTE — Progress Notes (Signed)
    TELEHEALTH OBSTETRICS VISIT ENCOUNTER NOTE  Provider location: [facility] for [facility]   Patient location: Home  I connected with Karen Mcmahon on 12/23/20 at 11:15 AM EDT by telephone at home and verified that I am speaking with the correct person using two identifiers. Of note, unable to do video encounter due to technical difficulties.    I discussed the limitations, risks, security and privacy concerns of performing an evaluation and management service by telephone and the availability of in person appointments. I also discussed with the patient that there may be a patient responsible charge related to this service. The patient expressed understanding and agreed to proceed.  Subjective:  Karen Mcmahon is a 30 y.o. G8T1959 at [redacted]w[redacted]d being followed for ongoing prenatal care.  She is currently monitored for the following issues for this high-risk pregnancy and has Keloid scar of skin; Left arm pain; History of recurrent vaginal discharge; Supervision of high risk pregnancy, antepartum; Maternal obesity affecting pregnancy, antepartum; Alpha thalassemia silent carrier; GDM (gestational diabetes mellitus); LGA (large for gestational age) fetus affecting management of mother, third trimester, fetus 1; and [redacted] weeks gestation of pregnancy on their problem list.  Patient reports no complaints. Reports fetal movement. Denies any contractions, bleeding or leaking of fluid.   The following portions of the patient's history were reviewed and updated as appropriate: allergies, current medications, past family history, past medical history, past social history, past surgical history and problem list.   Objective:  Last menstrual period 03/29/2020. General:  Alert, oriented and cooperative.   Mental Status: Normal mood and affect perceived. Normal judgment and thought content.  Rest of physical exam deferred due to type of encounter  Assessment and Plan:  Pregnancy: G4P1021 at  [redacted]w[redacted]d 1. Alpha thalassemia silent carrier   2. Maternal obesity affecting pregnancy, antepartum There is no height or weight on file to calculate BMI.   3. Supervision of high risk pregnancy, antepartum BP 151/100 at home today  4. Diet controlled gestational diabetes mellitus (GDM) in third trimester Good control, EFW 95%ile  Term labor symptoms and general obstetric precautions including but not limited to vaginal bleeding, contractions, leaking of fluid and fetal movement were reviewed in detail with the patient.  I discussed the assessment and treatment plan with the patient. The patient was provided an opportunity to ask questions and all were answered. The patient agreed with the plan and demonstrated an understanding of the instructions. The patient was advised to call back or seek an in-person office evaluation/go to MAU at [facility] for any urgent or concerning symptoms. Please refer to After Visit Summary for other counseling recommendations.  To MAU to evaluate for preeclampsia I provided 15 minutes of non-face-to-face time during this encounter.  Return in about 1 week (around 12/30/2020).  Future Appointments  Date Time Provider Department Center  12/27/2020  7:45 AM [facility] SCHED ROOM [facility] None    Scheryl Darter, MD [facility], [facility]

## 2020-12-23 NOTE — MAU Note (Signed)
.  Karen Mcmahon is a 30 y.o. at [redacted]w[redacted]d here in MAU reporting: sent from office for elevated BP. States she is not having HA, blurry vision or epigastric pain. Does have swelling in BLE. Denies VB or LOF. Endorses good fetal movement.   Pain score: 0 Vitals:   12/23/20 1233  BP: 130/85  Pulse: 98  Resp: 17  Temp: 98.7 F (37.1 C)  SpO2: 98%     FHT:159 Lab orders placed from triage:  UA

## 2020-12-23 NOTE — MAU Provider Note (Signed)
History     CSN: [id number]  Arrival date and time: 12/23/20 1225   Event Date/Time   First Provider Initiated Contact with Patient 12/23/20 1332      Chief Complaint  Patient presents with   Hypertension   Karen Mcmahon is a 30 y.o. U0R5615 at 69w3dwho receives care at [facility].  She presents today for Hypertension.  She was seen today, via virtual visit, and reported blood pressure of 150/100.  Patient was instructed to report to MAU and upon arrival bp normal.  Patient denies HA, visual disturbances, and RUQ pain.  Patient endorses fetal movement and denies    OB History     Gravida  4   Para  1   Term  1   Preterm      AB  2   Living  1      SAB  1   IAB  1   Ectopic      Multiple      Live Births  1           Past Medical History:  Diagnosis Date   Gestational diabetes    Hx of gonorrhea    Keloid    Left Upper Lateral [place]   Pregnant    hx   Trichomonas contact, treated     Past Surgical History:  Procedure Laterality Date   EAR CYST EXCISION Left 04/05/2014   Procedure: EXCISION EAR CYST;  Surgeon: MRuby Cola MD;  [place] [facility]  Service: ENT;  Laterality: Left;  Excision of keloid   EXTERNAL EAR SURGERY Left 12    Family History  Problem Relation Age of Onset   Kidney disease Mother        was on dialysis, rec'd a new kidney   Diabetes Mother    Hypertension Mother    Cancer Father        pancreatic   Pancreatic cancer Father    Diabetes Maternal Grandmother    Hypertension Maternal Grandmother    Other Neg Hx     Social History   Tobacco Use   Smoking status: Former    Packs/day: 0.50    Years: 2.00    Pack years: 1.00    Types: Cigarettes    Quit date: 03/18/2014    Years since quitting: 6.7   Smokeless tobacco: Never  Vaping Use   Vaping Use: Former  Substance Use Topics   Alcohol use: Not Currently    Comment: 1 mixed drink daily. Not since confirmed pregnancy   Drug use: Not Currently    Types:  Marijuana    Comment: not since + preg    Allergies:  Allergies  Allergen Reactions   Hydrocodone Itching    Medications Prior to Admission  Medication Sig Dispense Refill Last Dose   Accu-Chek Softclix Lancets lancets 4 times daily 100 each 12 12/23/2020   Blood Glucose Monitoring Suppl (ACCU-CHEK GUIDE) w/Device KIT 1 Device by Does not apply route 4 (four) times daily. 1 kit 0 12/23/2020   glucose blood (ACCU-CHEK GUIDE) test strip Use to check blood sugars four times a day was instructed 50 each 12 12/23/2020   Prenatal Vit-Fe Fumarate-FA (PREPLUS) 27-1 MG TABS Take 1 tablet by mouth daily. 30 tablet 13 12/23/2020   Prenat-Fe Carbonyl-FA-Omega 3 (ONE-A-DAY WOMENS PRENATAL 1) 28-0.8-235 MG CAPS Take by mouth. (Patient not taking: No sig reported)   Unknown    Review of Systems  Constitutional:  Negative for chills and  fever.  Eyes:  Negative for visual disturbance.  Respiratory:  Negative for cough and shortness of breath.   Genitourinary:  Negative for difficulty urinating, dysuria, vaginal bleeding and vaginal discharge.  Neurological:  Negative for dizziness, light-headedness and headaches.  Physical Exam   Blood pressure 126/75, pulse 82, temperature 98.1 F (36.7 C), temperature source Oral, resp. rate 18, last menstrual period 03/29/2020, SpO2 99 %.  Vitals:   12/23/20 1400 12/23/20 1415 12/23/20 1430 12/23/20 1513  BP: 120/70 122/71 126/75 122/87  Pulse: 88 83 82 89  Resp:    17  Temp:      TempSrc:    Oral  SpO2: 98% 99% 99% 100%     Physical Exam Constitutional:      Appearance: Normal appearance.  HENT:     Head: Normocephalic and atraumatic.  Eyes:     Conjunctiva/sclera: Conjunctivae normal.  Cardiovascular:     Rate and Rhythm: Normal rate.     Heart sounds: Normal heart sounds.  Pulmonary:     Effort: Pulmonary effort is normal. No respiratory distress.     Breath sounds: Normal breath sounds.  Abdominal:     General: Bowel sounds are normal.      Tenderness: There is no abdominal tenderness.     Comments: Gravid, Appears LGA  Musculoskeletal:        General: Normal range of motion.     Cervical back: Normal range of motion.  Skin:    General: Skin is warm and dry.  Neurological:     Mental Status: She is alert and oriented to person, place, and time.  Psychiatric:        Mood and Affect: Mood normal.        Behavior: Behavior normal.        Thought Content: Thought content normal.    Fetal Assessment 135 bpm, Mod Var, -Decels, +Accels Toco: Occasional   MAU Course   Results for orders placed or performed during the hospital encounter of 12/23/20 (from the past 24 hour(s))  Urinalysis, Routine w reflex microscopic Urine, Clean Catch     Status: Abnormal   Collection Time: 12/23/20 12:27 PM  Result Value Ref Range   Color, Urine YELLOW YELLOW   APPearance HAZY (A) CLEAR   Specific Gravity, Urine 1.011 1.005 - 1.030   pH 6.0 5.0 - 8.0   Glucose, UA NEGATIVE NEGATIVE mg/dL   Hgb urine dipstick NEGATIVE NEGATIVE   Bilirubin Urine NEGATIVE NEGATIVE   Ketones, ur NEGATIVE NEGATIVE mg/dL   Protein, ur 30 (A) NEGATIVE mg/dL   Nitrite NEGATIVE NEGATIVE   Leukocytes,Ua NEGATIVE NEGATIVE   RBC / HPF 0-5 0 - 5 RBC/hpf   WBC, UA 0-5 0 - 5 WBC/hpf   Bacteria, UA FEW (A) NONE SEEN   Squamous Epithelial / LPF 0-5 0 - 5   Mucus PRESENT    Granular Casts, UA PRESENT   Protein / creatinine ratio, urine     Status: Abnormal   Collection Time: 12/23/20  1:25 PM  Result Value Ref Range   Creatinine, Urine 113.38 mg/dL   Total Protein, Urine 55 mg/dL   Protein Creatinine Ratio 0.49 (H) 0.00 - 0.15 mg/mg[Cre]  CBC     Status: Abnormal   Collection Time: 12/23/20  1:40 PM  Result Value Ref Range   WBC 5.5 4.0 - 10.5 K/uL   RBC 4.04 3.87 - 5.11 MIL/uL   Hemoglobin 11.7 (L) 12.0 - 15.0 g/dL   HCT 35.5 (L) 36.0 -  46.0 %   MCV 87.9 80.0 - 100.0 fL   MCH 29.0 26.0 - 34.0 pg   MCHC 33.0 30.0 - 36.0 g/dL   RDW 14.1 11.5 - 15.5 %    Platelets 147 (L) 150 - 400 K/uL   nRBC 0.0 0.0 - 0.2 %  Comprehensive metabolic panel     Status: Abnormal   Collection Time: 12/23/20  1:40 PM  Result Value Ref Range   Sodium 135 135 - 145 mmol/L   Potassium 3.7 3.5 - 5.1 mmol/L   Chloride 109 98 - 111 mmol/L   CO2 19 (L) 22 - 32 mmol/L   Glucose, Bld 90 70 - 99 mg/dL   BUN 5 (L) 6 - 20 mg/dL   Creatinine, Ser 0.74 0.44 - 1.00 mg/dL   Calcium 8.6 (L) 8.9 - 10.3 mg/dL   Total Protein 5.3 (L) 6.5 - 8.1 g/dL   Albumin 2.5 (L) 3.5 - 5.0 g/dL   AST 21 15 - 41 U/L   ALT 14 0 - 44 U/L   Alkaline Phosphatase 219 (H) 38 - 126 U/L   Total Bilirubin 0.9 0.3 - 1.2 mg/dL   GFR, Estimated >60 >60 mL/min   Anion gap 7 5 - 15   No results found.  MDM PE Labs: CBC, CMP, PC Ratio Measure BPQ15 min EFM Pain Management Assessment and Plan  30 year old G4P1021  SIUP at 38.3 weeks Cat I FT Elevated BP   -Exam performed. -Labs ordered. -Will await results and reassess. -BP normotensive thus far.  -NST Reactive  Maryann Conners MSN, CNM 12/23/2020  Reassessment (2:58 PM) Proteinuria  -Labs as above.  -Dr. Toma Deiters consulted and informed of patient status, evaluation, interventions, and results. Advised: *Give Precautions *Have patient follow up as scheduled. -Patient to return on Saturday for IOL. -PreEclampsia precautions given. Instructed to check bp daily. -Reviewed Labor Precautions with FOB who verbalizes understanding. -Encouraged to call primary office or return to MAU if symptoms worsen or with the onset of new symptoms. -Discharged to home in stable condition.  Maryann Conners MSN, CNM Advanced Practice Provider, Center for [employer] [facility]

## 2020-12-23 NOTE — Telephone Encounter (Signed)
Preadmission screen  

## 2020-12-23 NOTE — Progress Notes (Signed)
Patient presents for MyChart ROB. Patient identified with two patient identifiers. Patient has no concerns today. Patient is unable to check BP at this time.

## 2020-12-24 ENCOUNTER — Encounter (HOSPITAL_COMMUNITY): Payer: Self-pay | Admitting: Obstetrics and Gynecology

## 2020-12-24 ENCOUNTER — Telehealth: Payer: Self-pay

## 2020-12-24 ENCOUNTER — Inpatient Hospital Stay (HOSPITAL_COMMUNITY)
Admission: AD | Admit: 2020-12-24 | Discharge: 2020-12-27 | DRG: 807 | Disposition: A | Discharge status: Home / Self Care | Payer: Medicaid Other | Attending: Obstetrics & Gynecology | Admitting: Obstetrics & Gynecology

## 2020-12-24 ENCOUNTER — Other Ambulatory Visit: Payer: Self-pay

## 2020-12-24 DIAGNOSIS — O1404 Mild to moderate pre-eclampsia, complicating childbirth: Secondary | ICD-10-CM | POA: Diagnosis present

## 2020-12-24 DIAGNOSIS — O3663X0 Maternal care for excessive fetal growth, third trimester, not applicable or unspecified: Secondary | ICD-10-CM | POA: Diagnosis present

## 2020-12-24 DIAGNOSIS — D563 Thalassemia minor: Secondary | ICD-10-CM | POA: Diagnosis present

## 2020-12-24 DIAGNOSIS — O99214 Obesity complicating childbirth: Secondary | ICD-10-CM | POA: Diagnosis present

## 2020-12-24 DIAGNOSIS — O24410 Gestational diabetes mellitus in pregnancy, diet controlled: Secondary | ICD-10-CM | POA: Diagnosis present

## 2020-12-24 DIAGNOSIS — O24420 Gestational diabetes mellitus in childbirth, diet controlled: Secondary | ICD-10-CM | POA: Diagnosis present

## 2020-12-24 DIAGNOSIS — Z87891 Personal history of nicotine dependence: Secondary | ICD-10-CM

## 2020-12-24 DIAGNOSIS — O1213 Gestational proteinuria, third trimester: Secondary | ICD-10-CM | POA: Diagnosis not present

## 2020-12-24 DIAGNOSIS — Z3A38 38 weeks gestation of pregnancy: Secondary | ICD-10-CM | POA: Diagnosis not present

## 2020-12-24 DIAGNOSIS — Z20822 Contact with and (suspected) exposure to covid-19: Secondary | ICD-10-CM | POA: Diagnosis present

## 2020-12-24 DIAGNOSIS — O99210 Obesity complicating pregnancy, unspecified trimester: Secondary | ICD-10-CM

## 2020-12-24 DIAGNOSIS — O0990 Supervision of high risk pregnancy, unspecified, unspecified trimester: Secondary | ICD-10-CM

## 2020-12-24 DIAGNOSIS — O1400 Mild to moderate pre-eclampsia, unspecified trimester: Secondary | ICD-10-CM

## 2020-12-24 HISTORY — DX: Malignant hyperthermia due to anesthesia, initial encounter: T88.3XXA

## 2020-12-24 LAB — GLUCOSE, CAPILLARY
Glucose-Capillary: 91 mg/dL (ref 70–99)
Glucose-Capillary: 84 mg/dL (ref 70–99)
Glucose-Capillary: 115 mg/dL — ABNORMAL HIGH (ref 70–99)

## 2020-12-24 LAB — CBC
HCT: 39 % (ref 36.0–46.0)
Hemoglobin: 12.9 g/dL (ref 12.0–15.0)
MCH: 29 pg (ref 26.0–34.0)
MCHC: 33.1 g/dL (ref 30.0–36.0)
MCV: 87.6 fL (ref 80.0–100.0)
Platelets: 158 10*3/uL (ref 150–400)
RBC: 4.45 MIL/uL (ref 3.87–5.11)
RDW: 14.1 % (ref 11.5–15.5)
WBC: 6.1 10*3/uL (ref 4.0–10.5)
nRBC: 0 % (ref 0.0–0.2)

## 2020-12-24 LAB — TYPE AND SCREEN
ABO/RH(D): B POS
Antibody Screen: NEGATIVE

## 2020-12-24 LAB — CULTURE, OB URINE: Culture: <10000 — AB

## 2020-12-24 LAB — RESP PANEL BY RT-PCR (FLU A&B, COVID) ARPGX2
Influenza A by PCR: NEGATIVE
Influenza B by PCR: NEGATIVE
SARS Coronavirus 2 by RT PCR: NEGATIVE

## 2020-12-24 MED ORDER — OXYTOCIN-SODIUM CHLORIDE 30-0.9 UT/500ML-% IV SOLN
2.5000 [IU]/h | INTRAVENOUS | Status: DC
  Administered 2020-12-25: 2.5 [IU]/h via INTRAVENOUS
  Filled 2020-12-24: qty 500

## 2020-12-24 MED ORDER — FENTANYL CITRATE (PF) 100 MCG/2ML IJ SOLN: 50.0000 ug | INTRAMUSCULAR | Status: DC | PRN

## 2020-12-24 MED ORDER — LACTATED RINGERS IV SOLN
INTRAVENOUS | Status: DC
Start: 2020-12-24 — End: 2020-12-25

## 2020-12-24 MED ORDER — SOD CITRATE-CITRIC ACID 500-334 MG/5ML PO SOLN: 30.0000 mL | ORAL | Status: DC | PRN

## 2020-12-24 MED ORDER — HYDROXYZINE HCL 50 MG PO TABS: 50.0000 mg | ORAL_TABLET | Freq: Four times a day (QID) | ORAL | Status: DC | PRN

## 2020-12-24 MED ORDER — MISOPROSTOL 25 MCG QUARTER TABLET
25.0000 ug | ORAL_TABLET | ORAL | Status: DC | PRN
  Administered 2020-12-24: 25 ug via VAGINAL
  Filled 2020-12-24: qty 1

## 2020-12-24 MED ORDER — MISOPROSTOL 50MCG HALF TABLET
50.0000 ug | ORAL_TABLET | ORAL | Status: DC
  Administered 2020-12-24: 50 ug via BUCCAL
  Filled 2020-12-24: qty 1

## 2020-12-24 MED ORDER — TERBUTALINE SULFATE 1 MG/ML IJ SOLN: 0.2500 mg | Freq: Once | INTRAMUSCULAR | Status: DC | PRN

## 2020-12-24 MED ORDER — ONDANSETRON HCL 4 MG/2ML IJ SOLN
4.0000 mg | Freq: Four times a day (QID) | INTRAMUSCULAR | Status: DC | PRN
  Administered 2020-12-25: 4 mg via INTRAVENOUS
  Filled 2020-12-24: qty 2

## 2020-12-24 MED ORDER — LACTATED RINGERS IV SOLN: 500.0000 mL | INTRAVENOUS | Status: DC | PRN

## 2020-12-24 MED ORDER — OXYTOCIN BOLUS FROM INFUSION
333.0000 mL | Freq: Once | INTRAVENOUS | Status: AC
  Administered 2020-12-25: 333 mL via INTRAVENOUS

## 2020-12-24 MED ORDER — LIDOCAINE HCL (PF) 1 % IJ SOLN: 30.0000 mL | INTRAMUSCULAR | Status: DC | PRN

## 2020-12-24 MED ORDER — ACETAMINOPHEN 325 MG PO TABS
650.0000 mg | ORAL_TABLET | ORAL | Status: DC | PRN
  Filled 2020-12-24: qty 2

## 2020-12-24 MED ORDER — OXYCODONE-ACETAMINOPHEN 5-325 MG PO TABS: 1.0000 | ORAL_TABLET | ORAL | Status: DC | PRN

## 2020-12-24 MED ORDER — OXYTOCIN-SODIUM CHLORIDE 30-0.9 UT/500ML-% IV SOLN
1.0000 m[IU]/min | INTRAVENOUS | Status: DC
  Administered 2020-12-25: 2 m[IU]/min via INTRAVENOUS
  Filled 2020-12-24: qty 500

## 2020-12-24 NOTE — Plan of Care (Signed)
  Problem: Pain Management: Goal: Relief or control of pain from uterine contractions will improve Outcome: Progressing   Problem: Education: Goal: Knowledge of General Education information will improve Description: Including pain rating scale, medication(s)/side effects and non-pharmacologic comfort measures Outcome: Progressing

## 2020-12-24 NOTE — Progress Notes (Signed)
Labor Progress Note Karen Mcmahon is a 30 y.o. [id number] at [redacted]w[redacted]d presented for IOL for pre-eclampsia with mild features.   S: Doing well, starting to feel contractions and breathing through them.   O:  BP 130/84 (BP Location: Left Arm)   Pulse 85   Temp 97.7 F (36.5 C) (Oral)   Resp 17   Ht 5\' 6"  (1.676 m)   Wt 119.9 kg   LMP 03/29/2020   SpO2 99%   BMI 42.66 kg/m  EFM: 135/mod/15x15/none  CVE: Dilation: Closed Effacement (%): Thick Cervical Position: Posterior Station: Ballotable Presentation: Vertex confirmed with BSUS Exam by:: Dr [email or web address]   A&P: 30 y.o. 26 [redacted]w[redacted]d  #Labor: Minimal progression since last check with quite posterior cervix, however now starting to contract about every 3 min and uncomfortable s/p 1 vaginal cytotec. Will monitor contractions for the next 15-20 min, if spacing will add another cytotec buccal.  #Pain: Plans for epidural  #FWB: Cat 1  #GBS negative  #A1GDM: Admit CBG 91. Cont to monitor q4.   #Pre-eclampsia without severe features: BP WNL at admit. P/Cr 0.49, pre-e labs otherwise WNL. Asymptomatic. Cont to monitor.   [redacted]w[redacted]d, DO 5:37 PM

## 2020-12-24 NOTE — Anesthesia Preprocedure Evaluation (Signed)
Anesthesia Evaluation  Patient identified by MRN, date of birth, ID band Patient awake  General Assessment Comment:Mother of patient reportedly has MH, Patient has had GETA and IH agents as well as Succinylcholine were used without reported adverse event.  Reviewed: Allergy & Precautions, Patient's Chart, lab work & pertinent test results  History of Anesthesia Complications (+) MALIGNANT HYPERTHERMIA and history of anesthetic complications  Airway Mallampati: II  TM Distance: >3 FB Neck ROM: Full    Dental no notable dental hx. (+) Teeth Intact, Dental Advisory Given   Pulmonary neg pulmonary ROS, former smoker,    Pulmonary exam normal breath sounds clear to auscultation       Cardiovascular negative cardio ROS Normal cardiovascular exam Rhythm:Regular Rate:Normal     Neuro/Psych negative neurological ROS  negative psych ROS   GI/Hepatic Neg liver ROS, GERD  ,  Endo/Other  diabetes, Well Controlled, GestationalMorbid obesity  Renal/GU negative Renal ROS  negative genitourinary   Musculoskeletal negative musculoskeletal ROS (+)   Abdominal (+) + obese,   Peds  Hematology  (+) anemia ,   Anesthesia Other Findings   Reproductive/Obstetrics (+) Pregnancy                             Anesthesia Physical Anesthesia Plan  ASA: 3  Anesthesia Plan: Epidural   Post-op Pain Management:    Induction:   PONV Risk Score and Plan: Treatment may vary due to age or medical condition  Airway Management Planned: Natural Airway  Additional Equipment:   Intra-op Plan:   Post-operative Plan:   Informed Consent: I have reviewed the patients History and Physical, chart, labs and discussed the procedure including the risks, benefits and alternatives for the proposed anesthesia with the patient or authorized representative who has indicated his/her understanding and acceptance.       Plan  Discussed with: Anesthesiologist  Anesthesia Plan Comments:         Anesthesia Quick Evaluation

## 2020-12-24 NOTE — Progress Notes (Signed)
Labor Progress Note EDIT RICCIARDELLI is a 30 y.o. [id number] at [redacted]w[redacted]d presented for IOL due to mild pre-eclampsia and A1GDM.   S: Doing well. Feeling more cramping. No concerns at this time.  O:  BP 129/87   Pulse (!) 101   Temp 97.9 F (36.6 C) (Oral)   Resp 17   Ht 5\' 6"  (1.676 m)   Wt 119.9 kg   LMP 03/29/2020   SpO2 99%   BMI 42.66 kg/m   EFM: Baseline 140 bpm, moderate variability, + accels, no decels  CVE: Dilation: 2 Effacement (%): Thick Cervical Position: Posterior Station: Ballotable Presentation: Vertex Exam by:: Dr. [email or web address]   A&P: 30 y.o. 26 [redacted]w[redacted]d   #Labor: Progressing well since last check. Foley balloon placed without complication. Will give Cytotec as able. Currently contracting too much for additional dose. Plan to reassess in 4 hours.  #Pain: PRN  #FWB: Cat 1  #GBS negative  #Mild pre-eclampsia: BP in normal range. Asymptomatic. Will continue to monitor.   #A1GDM: CBGs within normal limits. Will continue q4hr glucose checks.   [redacted]w[redacted]d, MD 11:52 PM

## 2020-12-24 NOTE — MAU Note (Signed)
Presents stating she was instructed to come in for BP eval after taking BP @ home.  Reports BP was 157/110.  Denies H/A, visual disturbances and epigastric pain. Denies VB or LOF, reports woke up this morning feeling wetness between legs.  Endorses +FM, but  "not as much".

## 2020-12-24 NOTE — MAU Provider Note (Signed)
History     Patient Active Problem List   Diagnosis Date Noted   LGA (large for gestational age) fetus affecting management of mother, third trimester, fetus 1 11/25/2020   GDM (gestational diabetes mellitus) 10/28/2020   Alpha thalassemia silent carrier 06/24/2020   Maternal obesity affecting pregnancy, antepartum 06/09/2020   Supervision of high risk pregnancy, antepartum 05/28/2020   History of recurrent vaginal discharge 11/30/2018   Left arm pain 11/15/2018   Keloid scar of skin 03/22/2012    Chief Complaint  Patient presents with   BP Eval   Karen Mcmahon is a 30 y.o. L2G4010 at 36w4dwho receives care at [facility].  She presents today for BP Eval.  She states she woke this morning and her blood pressure was 157/110.  Patient denies HA, visual disturbances, or RUQ pain.  She endorses fetal movement and denies abdominal cramping.  She also denies vaginal bleeding or discharge of concern.    OB History     Gravida  4   Para  1   Term  1   Preterm      AB  2   Living  1      SAB  1   IAB  1   Ectopic      Multiple      Live Births  1           Past Medical History:  Diagnosis Date   Gestational diabetes    Hx of gonorrhea    Keloid    Left Upper Lateral Earlobe   Pregnant    hx   Trichomonas contact, treated     Past Surgical History:  Procedure Laterality Date   EAR CYST EXCISION Left 04/05/2014   Procedure: EXCISION EAR CYST;  Surgeon: MRuby Cola MD;  Location[facility]  Service: ENT;  Laterality: Left;  Excision of keloid   EXTERNAL EAR SURGERY Left 12    Family History  Problem Relation Age of Onset   Kidney disease Mother        was on dialysis, rec'd a new kidney   Diabetes Mother    Hypertension Mother    Cancer Father        pancreatic   Pancreatic cancer Father    Diabetes Maternal Grandmother    Hypertension Maternal Grandmother    Other Neg Hx     Social History   Tobacco Use   Smoking status: Former    Packs/day:  0.50    Years: 2.00    Pack years: 1.00    Types: Cigarettes    Quit date: 03/18/2014    Years since quitting: 6.7   Smokeless tobacco: Never  Vaping Use   Vaping Use: Former  Substance Use Topics   Alcohol use: Not Currently    Comment: 1 mixed drink daily. Not since confirmed pregnancy   Drug use: Not Currently    Types: Marijuana    Comment: not since + preg    Allergies:  Allergies  Allergen Reactions   Hydrocodone Itching    Medications Prior to Admission  Medication Sig Dispense Refill Last Dose   Prenatal Vit-Fe Fumarate-FA (PREPLUS) 27-1 MG TABS Take 1 tablet by mouth daily. 30 tablet 13 12/23/2020   Accu-Chek Softclix Lancets lancets 4 times daily 100 each 12    Blood Glucose Monitoring Suppl (ACCU-CHEK GUIDE) w/Device KIT 1 Device by Does not apply route 4 (four) times daily. 1 kit 0    glucose blood (ACCU-CHEK GUIDE) test strip  Use to check blood sugars four times a day was instructed 50 each 12     ROS  See HPI Above Physical Exam   Blood pressure 133/90, pulse (!) 103, temperature 98 F (36.7 C), temperature source Oral, resp. rate 20, height _0  (1.676 m), weight 119.9 kg, last menstrual period 03/29/2020, SpO2 99 %.  Vitals:   12/24/20 0940 12/24/20 0946 12/24/20 1011  BP:  134/88 133/90  Pulse:  92 (!) 103  Resp:  20   Temp:  98 F (36.7 C)   TempSrc:  Oral   SpO2:  99%   Weight: 119.9 kg 119.9 kg   Height: _1  (1.676 m) _2  (1.676 m)      Results for orders placed or performed during the hospital encounter of 12/23/20 (from the past 24 hour(s))  Urinalysis, Routine w reflex microscopic Urine, Clean Catch     Status: Abnormal   Collection Time: 12/23/20 12:27 PM  Result Value Ref Range   Color, Urine YELLOW YELLOW   APPearance HAZY (A) CLEAR   Specific Gravity, Urine 1.011 1.005 - 1.030   pH 6.0 5.0 - 8.0   Glucose, UA NEGATIVE NEGATIVE mg/dL   Hgb urine dipstick NEGATIVE NEGATIVE   Bilirubin Urine NEGATIVE NEGATIVE   Ketones, ur  NEGATIVE NEGATIVE mg/dL   Protein, ur 30 (A) NEGATIVE mg/dL   Nitrite NEGATIVE NEGATIVE   Leukocytes,Ua NEGATIVE NEGATIVE   RBC / HPF 0-5 0 - 5 RBC/hpf   WBC, UA 0-5 0 - 5 WBC/hpf   Bacteria, UA FEW (A) NONE SEEN   Squamous Epithelial / LPF 0-5 0 - 5   Mucus PRESENT    Granular Casts, UA PRESENT   Protein / creatinine ratio, urine     Status: Abnormal   Collection Time: 12/23/20  1:25 PM  Result Value Ref Range   Creatinine, Urine 113.38 mg/dL   Total Protein, Urine 55 mg/dL   Protein Creatinine Ratio 0.49 (H) 0.00 - 0.15 mg/mg[Cre]  CBC     Status: Abnormal   Collection Time: 12/23/20  1:40 PM  Result Value Ref Range   WBC 5.5 4.0 - 10.5 K/uL   RBC 4.04 3.87 - 5.11 MIL/uL   Hemoglobin 11.7 (L) 12.0 - 15.0 g/dL   HCT 35.5 (L) 36.0 - 46.0 %   MCV 87.9 80.0 - 100.0 fL   MCH 29.0 26.0 - 34.0 pg   MCHC 33.0 30.0 - 36.0 g/dL   RDW 14.1 11.5 - 15.5 %   Platelets 147 (L) 150 - 400 K/uL   nRBC 0.0 0.0 - 0.2 %  Comprehensive metabolic panel     Status: Abnormal   Collection Time: 12/23/20  1:40 PM  Result Value Ref Range   Sodium 135 135 - 145 mmol/L   Potassium 3.7 3.5 - 5.1 mmol/L   Chloride 109 98 - 111 mmol/L   CO2 19 (L) 22 - 32 mmol/L   Glucose, Bld 90 70 - 99 mg/dL   BUN 5 (L) 6 - 20 mg/dL   Creatinine, Ser 0.74 0.44 - 1.00 mg/dL   Calcium 8.6 (L) 8.9 - 10.3 mg/dL   Total Protein 5.3 (L) 6.5 - 8.1 g/dL   Albumin 2.5 (L) 3.5 - 5.0 g/dL   AST 21 15 - 41 U/L   ALT 14 0 - 44 U/L   Alkaline Phosphatase 219 (H) 38 - 126 U/L   Total Bilirubin 0.9 0.3 - 1.2 mg/dL   GFR, Estimated >60 >60 mL/min  Anion gap 7 5 - 15  Culture, OB Urine     Status: None (Preliminary result)   Collection Time: 12/23/20  2:44 PM   Specimen: Urine, Random  Result Value Ref Range   Specimen Description URINE, RANDOM    Special Requests NONE    Culture      CULTURE REINCUBATED FOR BETTER GROWTH Performed at [facility], [place] [address] [place], [place] [address]    Report Status PENDING      Physical Exam Constitutional:      Appearance: Normal appearance.  HENT:     Head: Normocephalic and atraumatic.  Eyes:     Conjunctiva/sclera: Conjunctivae normal.  Cardiovascular:     Rate and Rhythm: Normal rate and regular rhythm.     Heart sounds: Normal heart sounds.  Pulmonary:     Effort: Pulmonary effort is normal. No respiratory distress.     Breath sounds: Normal breath sounds.  Abdominal:     Palpations: Abdomen is soft.     Tenderness: There is no abdominal tenderness.     Comments: Gravid, Appears LGA  Musculoskeletal:        General: Normal range of motion.     Cervical back: Normal range of motion.     Right lower leg: No edema.     Left lower leg: No edema.  Skin:    General: Skin is warm and dry.  Neurological:     Mental Status: She is alert and oriented to person, place, and time.  Psychiatric:        Mood and Affect: Mood normal.        Behavior: Behavior normal.        Thought Content: Thought content normal.     FHR: 145 bpm, Mod Var, -Decels, +Accels UC: Irregular, Q4-61mn Assessment/Plan  30year old G4P1021 at 38.4 weeks Elevated BP at Home Known Proteinuria  -Provider consults with Dr. KRaliegh IpNewton regarding patient complaint and initial bp.  Provider questions appropriateness of repeat lab work in short interval and lack of new symptoms. After review of chart, Advises: *Monitor blood pressures for ~ 2 hours *If normal discharge to home with possible bp check in office *If abnormal admit for IOL -Patient and SO updated on POC -Patient expresses some concern with elevations at home. -Informed that if all BP normal today, will provide education and schedule for next bp check in office. -No other questions or concerns expressed. -NST reactive. -Will monitor and reassess   [place] MSN 12/24/2020 10:36 AM  [facility]ass[facility] (11:56 AM) [facility] -Provider reviews bps and notes one elevation of 129/97. -Dr. KRaliegh Ip Newton updated on  results and advises: Admit for IOL. -L&D team requests induction orders to be released. -Nurse informed of POC. -Provider to bedside to discuss admission with patient and SO. -No questions.  -[place] 132/35confirms cephalic presentation -Congratulations given.  JMaryann ConnersMSN, CNM Advanced Practice Provider, [facility]

## 2020-12-24 NOTE — Telephone Encounter (Signed)
Pt called after hours nurse line and stated BP was elevated again at home at 156/102. Also stated she is having decreased fetal movement. Pt states that she is beginning to have irregular contractions. Denies any HA, visual changes, significant swelling, or RUQ pain. Pt denies loss of fluid or bleeding. Advised patient to go to MAU for evaluation due to symptoms. Pt is scheduled for induction on 12/27/20. Pt agrees with plan and verbalized understanding.

## 2020-12-24 NOTE — H&P (Signed)
OBSTETRIC ADMISSION HISTORY AND PHYSICAL  Karen Mcmahon is a 30 y.o. female [phone] with IUP at 8w4dby early UKoreapresenting for IOL due to pre-eclampsia with mild features after being evaluated in the MAU for elevated pressures. She reports +FMs, No LOF, no VB, no blurry vision, headaches or peripheral edema, and RUQ pain.  She plans on breast feeding. She is unsure for birth control. [facility]  Estimated Date of Delivery: 01/03/21  Sono:    _0 , CWD, normal anatomy, cephalic presentation, [id number] 95% EFW   Prenatal History/Complications:  --AG2XBM Fasting 80s, postprandial around 120's.  --Pre-eclampsia with mild features, known proteinuria and now has had elevated blood pressures.  --Patient's mother with malignant hyperthermia  --Alpha thal silent carrier   Past Medical History: Past Medical History:  Diagnosis Date   Gestational diabetes    Hx of gonorrhea    Keloid    Left Upper Lateral Earlobe   Malignant hyperthermia    Pregnant    hx   Trichomonas contact, treated     Past Surgical History: Past Surgical History:  Procedure Laterality Date   EAR CYST EXCISION Left 04/05/2014   Procedure: EXCISION EAR CYST;  Surgeon: MRuby Cola MD;  [facility]  Service: ENT;  Laterality: Left;  Excision of keloid   EXTERNAL EAR SURGERY Left 2012    Obstetrical History: OB History     Gravida  4   Para  1   Term  1   Preterm      AB  2   Living  1      SAB  1   IAB  1   Ectopic      Multiple      Live Births  1           Social History Social History   Socioeconomic History   Marital status: Single    Spouse name: Not on file   Number of children: Not on file   Years of education: Not on file   Highest education level: Not on file  Occupational History   Occupation: fork lift driver    Employer: [employer] Tobacco Use   Smoking status: Former    Packs/day: 0.50    Years:  2.00    Pack years: 1.00    Types: Cigarettes    Quit date: 03/18/2014    Years since quitting: 6.7   Smokeless tobacco: Never  Vaping Use   Vaping Use: Former  Substance and Sexual Activity   Alcohol use: Not Currently    Comment: 1 mixed drink daily. Not since confirmed pregnancy   Drug use: Not Currently    Types: Marijuana    Comment: not since + preg   Sexual activity: Yes    Partners: Male    Birth control/protection: None  Other Topics Concern   Not on file  Social History Narrative   Not on file   Social Determinants of Health   Financial Resource Strain: Not on file  Food Insecurity: Not on file  Transportation Needs: Not on file  Physical Activity: Not on file  Stress: Not on file  Social Connections: Not on file    Family History: Family History  Problem Relation Age of Onset   Kidney disease Mother        was on dialysis, rec'd a new kidney   Diabetes Mother    Hypertension Mother  Cancer Father        pancreatic   Pancreatic cancer Father    Diabetes Maternal Grandmother    Hypertension Maternal Grandmother    Other Neg Hx     Allergies: Allergies  Allergen Reactions   Hydrocodone Itching    Medications Prior to Admission  Medication Sig Dispense Refill Last Dose   Prenatal Vit-Fe Fumarate-FA (PREPLUS) 27-1 MG TABS Take 1 tablet by mouth daily. 30 tablet 13 12/23/2020   Accu-Chek Softclix Lancets lancets 4 times daily 100 each 12    Blood Glucose Monitoring Suppl (ACCU-CHEK GUIDE) w/Device KIT 1 Device by Does not apply route 4 (four) times daily. 1 kit 0    glucose blood (ACCU-CHEK GUIDE) test strip Use to check blood sugars four times a day was instructed 50 each 12      Review of Systems   All systems reviewed and negative except as stated in HPI  Blood pressure 124/72, pulse 89, temperature 98 F (36.7 C), temperature source Oral, resp. rate 17, height _0  (1.676 m), weight 119.9 kg, last menstrual period 03/29/2020, SpO2 99  %. General appearance: alert, cooperative, and no distress Lungs: Normal WOB  Heart: regular rate and rhythm Abdomen: soft, non-tender Pelvic: NEFG Extremities: Homans sign is negative, no sign of DVT Presentation: cephalic Fetal monitoring Baseline: 140 bpm, Variability: Good {> 6 bpm), Accelerations: Reactive, and Decelerations: Absent Uterine activity occasional  Dilation: 1.5 Effacement (%): 50 Station: -3 Exam by:: Bary Castilla, RN   Prenatal labs: ABO, Rh: --/--/B POS (10/26 1218) Antibody: NEG (10/26 1218) Rubella: 1.63 (04/11 1011) RPR: Non Reactive (08/02 1105)  HBsAg: Negative (04/11 1011)  HIV: Non Reactive (08/02 1105)  GBS: Negative/-- (10/11 1413)  Genetic screening  low risk NIPS Anatomy US normal   Prenatal Transfer Tool  Maternal Diabetes: Yes:  Diabetes Type:  Diet controlled Genetic Screening: Normal Maternal Ultrasounds/Referrals: Normal Fetal Ultrasounds or other Referrals:  None Maternal Substance Abuse:  No Significant Maternal Medications:  None Significant Maternal Lab Results: Group B Strep negative  Results for orders placed or performed during the hospital encounter of 12/24/20 (from the past 24 hour(s))  CBC   Collection Time: 12/24/20 12:18 PM  Result Value Ref Range   WBC 6.1 4.0 - 10.5 K/uL   RBC 4.45 3.87 - 5.11 MIL/uL   Hemoglobin 12.9 12.0 - 15.0 g/dL   HCT 39.0 36.0 - 46.0 %   MCV 87.6 80.0 - 100.0 fL   MCH 29.0 26.0 - 34.0 pg   MCHC 33.1 30.0 - 36.0 g/dL   RDW 14.1 11.5 - 15.5 %   Platelets 158 150 - 400 K/uL   nRBC 0.0 0.0 - 0.2 %  Type and screen   Collection Time: 12/24/20 12:18 PM  Result Value Ref Range   ABO/RH(D) B POS    Antibody Screen NEG    Sample Expiration      12/27/2020,2359 Performed at [facility], [place] [address] [place], [place] [address]   Resp Panel by RT-PCR (Flu A&B, Covid) Nasopharyngeal Swab   Collection Time: 12/24/20 12:21 PM   Specimen: Nasopharyngeal Swab; Nasopharyngeal(NP) swabs  in vial transport medium  Result Value Ref Range   SARS Coronavirus 2 by RT PCR NEGATIVE NEGATIVE   Influenza A by PCR NEGATIVE NEGATIVE   Influenza B by PCR NEGATIVE NEGATIVE  Glucose, capillary   Collection Time: 12/24/20  2:32 PM  Result Value Ref Range   Glucose-Capillary 91 70 - 99 mg/dL  Patient Active Problem List   Diagnosis Date Noted   Diet controlled gestational diabetes mellitus (GDM) in third trimester 12/24/2020   LGA (large for gestational age) fetus affecting management of mother, third trimester, fetus 1 11/25/2020   GDM (gestational diabetes mellitus) 10/28/2020   Alpha thalassemia silent carrier 06/24/2020   Maternal obesity affecting pregnancy, antepartum 06/09/2020   Supervision of high risk pregnancy, antepartum 05/28/2020   History of recurrent vaginal discharge 11/30/2018   Left arm pain 11/15/2018   Keloid scar of skin 03/22/2012    Assessment/Plan:  Karen Mcmahon is a 30 y.o. [id number] at 52w4dhere for IOL due to pre-eclampsia with mild features after being evaluated in the MAU.   #Labor: Checked per RTherapist, sports Plan to start with cytotec, serial cervical checks and reassess for FB as possible.  #Pain: Planning for epidural  #FWB: Cat 1 #ID: GBS negative  #MOF: Breastfeeding  #MOC: Undecided, discussed options  #Circ: N/A  #A1GDM: Admit CBG 91. EFW 95%, 4056gm. Pelvis proven to 4015g. Cont to monitor q4.    #Pre-eclampsia without severe features: BP WNL at admit. P/Cr 0.49, pre-e labs otherwise WNL. Asymptomatic. Cont to monitor.   SPatriciaann Clan DO  12/24/2020, 3:29 PM

## 2020-12-25 ENCOUNTER — Inpatient Hospital Stay (HOSPITAL_COMMUNITY): Payer: Medicaid Other | Admitting: Anesthesiology

## 2020-12-25 DIAGNOSIS — O1404 Mild to moderate pre-eclampsia, complicating childbirth: Secondary | ICD-10-CM

## 2020-12-25 DIAGNOSIS — O24420 Gestational diabetes mellitus in childbirth, diet controlled: Secondary | ICD-10-CM

## 2020-12-25 DIAGNOSIS — O1400 Mild to moderate pre-eclampsia, unspecified trimester: Secondary | ICD-10-CM

## 2020-12-25 DIAGNOSIS — Z3A38 38 weeks gestation of pregnancy: Secondary | ICD-10-CM

## 2020-12-25 LAB — CBC
HCT: 37.7 % (ref 36.0–46.0)
HCT: 37.6 % (ref 36.0–46.0)
Hemoglobin: 12.5 g/dL (ref 12.0–15.0)
Hemoglobin: 12.5 g/dL (ref 12.0–15.0)
MCH: 28.8 pg (ref 26.0–34.0)
MCH: 28.8 pg (ref 26.0–34.0)
MCHC: 33.2 g/dL (ref 30.0–36.0)
MCHC: 33.2 g/dL (ref 30.0–36.0)
MCV: 86.9 fL (ref 80.0–100.0)
MCV: 86.6 fL (ref 80.0–100.0)
Platelets: 153 10*3/uL (ref 150–400)
Platelets: 147 10*3/uL — ABNORMAL LOW (ref 150–400)
RBC: 4.34 MIL/uL (ref 3.87–5.11)
RBC: 4.34 MIL/uL (ref 3.87–5.11)
RDW: 14.1 % (ref 11.5–15.5)
RDW: 14.1 % (ref 11.5–15.5)
WBC: 7.1 10*3/uL (ref 4.0–10.5)
WBC: 15.3 10*3/uL — ABNORMAL HIGH (ref 4.0–10.5)
nRBC: 0 % (ref 0.0–0.2)
nRBC: 0 % (ref 0.0–0.2)

## 2020-12-25 LAB — RPR: RPR Ser Ql: NONREACTIVE

## 2020-12-25 LAB — GLUCOSE, CAPILLARY
Glucose-Capillary: 79 mg/dL (ref 70–99)
Glucose-Capillary: 67 mg/dL — ABNORMAL LOW (ref 70–99)
Glucose-Capillary: 75 mg/dL (ref 70–99)
Glucose-Capillary: 95 mg/dL (ref 70–99)
Glucose-Capillary: 118 mg/dL — ABNORMAL HIGH (ref 70–99)

## 2020-12-25 MED ORDER — PHENYLEPHRINE 40 MCG/ML (10ML) SYRINGE FOR IV PUSH (FOR BLOOD PRESSURE SUPPORT): 80.0000 ug | PREFILLED_SYRINGE | INTRAVENOUS | Status: DC | PRN

## 2020-12-25 MED ORDER — FENTANYL-BUPIVACAINE-NACL 0.5-0.125-0.9 MG/250ML-% EP SOLN
12.0000 mL/h | EPIDURAL | Status: DC | PRN
  Administered 2020-12-25: 12 mL/h via EPIDURAL
  Filled 2020-12-25: qty 250

## 2020-12-25 MED ORDER — EPHEDRINE 5 MG/ML INJ: 10.0000 mg | INTRAVENOUS | Status: DC | PRN

## 2020-12-25 MED ORDER — OXYTOCIN-SODIUM CHLORIDE 30-0.9 UT/500ML-% IV SOLN: 1.0000 m[IU]/min | INTRAVENOUS | Status: DC

## 2020-12-25 MED ORDER — TERBUTALINE SULFATE 1 MG/ML IJ SOLN: 0.2500 mg | Freq: Once | INTRAMUSCULAR | Status: DC | PRN

## 2020-12-25 MED ORDER — LACTATED RINGERS IV SOLN: 500.0000 mL | Freq: Once | INTRAVENOUS | Status: DC

## 2020-12-25 MED ORDER — DIPHENHYDRAMINE HCL 50 MG/ML IJ SOLN
12.5000 mg | INTRAMUSCULAR | Status: DC | PRN
Start: 2020-12-25 — End: 2020-12-25

## 2020-12-25 MED ORDER — LIDOCAINE HCL (PF) 1 % IJ SOLN
INTRAMUSCULAR | Status: DC | PRN
  Administered 2020-12-25: 12 mL via EPIDURAL

## 2020-12-25 MED ORDER — ACETAMINOPHEN 500 MG PO TABS
1000.0000 mg | ORAL_TABLET | Freq: Once | ORAL | Status: AC
  Administered 2020-12-25: 1000 mg via ORAL
  Filled 2020-12-25: qty 2

## 2020-12-25 MED ORDER — PHENYLEPHRINE 40 MCG/ML (10ML) SYRINGE FOR IV PUSH (FOR BLOOD PRESSURE SUPPORT)
80.0000 ug | PREFILLED_SYRINGE | INTRAVENOUS | Status: DC | PRN
  Filled 2020-12-25: qty 10

## 2020-12-25 MED ORDER — FENTANYL-BUPIVACAINE-NACL 0.5-0.125-0.9 MG/250ML-% EP SOLN
12.0000 mL/h | EPIDURAL | Status: DC | PRN
Start: 2020-12-25 — End: 2020-12-25

## 2020-12-25 NOTE — Discharge Summary (Signed)
Postpartum Discharge Summary  Date of Service updated 12/27/20     Patient Name: Karen Mcmahon DOB: October 10, 1990 MRN: [id number]  Date of admission: 12/24/2020 Delivery date:12/25/2020  Delivering provider: Araceli Bouche  Date of discharge: 12/27/2020  Admitting diagnosis: Diet controlled gestational diabetes mellitus (GDM) in third trimester [O24.410] Intrauterine pregnancy: [redacted]w[redacted]d     Secondary diagnosis:  Active Problems:   Diet controlled gestational diabetes mellitus (GDM) in third trimester   Mild preeclampsia  Additional problems: n/a    Discharge diagnosis: Term Pregnancy Delivered                                              Post partum procedures: n/a Augmentation: AROM, Pitocin, Cytotec, and IP Foley Complications: None  Hospital course: Induction of Labor With Vaginal Delivery   30 y.o. yo [id number] at [redacted]w[redacted]d was admitted to the hospital 12/24/2020 for induction of labor.  Indication for induction:  A1DM, Preeclampsia w/mild features .  Patient had an uncomplicated labor course as follows: Membrane Rupture Time/Date: 11:12 AM ,12/25/2020   Delivery Method:Vaginal, Spontaneous  Episiotomy: None  Lacerations:  None  Details of delivery can be found in separate delivery note.  Patient had a routine postpartum course. Patient is discharged home 12/27/20.  Newborn Data: Birth date:12/25/2020  Birth time:9:23 PM  Gender:Female  Living status:Living  Apgars:8 ,9  Weight:4252 g   Magnesium Sulfate received: No BMZ received: No Rhophylac:N/A MMR:N/A T-DaP: declined Flu: declined Transfusion:No  Physical exam  Vitals:   12/26/20 0508 12/26/20 1743 12/26/20 2310 12/27/20 0522  BP: (!) 104/59 132/84 126/80 (!) 110/55  Pulse: (!) 105 90 90 79  Resp:  $[place]o'16 16 18  '[place]$ Temp: 98.2 F (36.8 C) 98.1 F (36.7 C) (!) 97.5 F (36.4 C) 97.8 F (36.6 C)  TempSrc: Oral Oral Oral Oral  SpO2: 100%     Weight:      Height:       General: alert, cooperative, and no  distress Lochia: appropriate Uterine Fundus: firm Incision: N/A DVT Evaluation: No evidence of DVT seen on physical exam. Labs: Lab Results  Component Value Date   WBC 15.3 (H) 12/25/2020   HGB 12.5 12/25/2020   HCT 37.6 12/25/2020   MCV 86.6 12/25/2020   PLT 147 (L) 12/25/2020   CMP Latest Ref Rng & Units 12/23/2020  Glucose 70 - 99 mg/dL 90  BUN 6 - 20 mg/dL 5(L)  Creatinine 0.44 - 1.00 mg/dL 0.74  Sodium 135 - 145 mmol/L 135  Potassium 3.5 - 5.1 mmol/L 3.7  Chloride 98 - 111 mmol/L 109  CO2 22 - 32 mmol/L 19(L)  Calcium 8.9 - 10.3 mg/dL 8.6(L)  Total Protein 6.5 - 8.1 g/dL 5.3(L)  Total Bilirubin 0.3 - 1.2 mg/dL 0.9  Alkaline Phos 38 - 126 U/L 219(H)  AST 15 - 41 U/L 21  ALT 0 - 44 U/L 14   Edinburgh Score: Edinburgh Postnatal Depression Scale Screening Tool 12/26/2020  I have been able to laugh and see the funny side of things. 0  I have looked forward with enjoyment to things. 0  I have blamed myself unnecessarily when things went wrong. 2  I have been anxious or worried for no good reason. 0  I have felt scared or panicky for no good reason. 1  Things have been getting on top of me. 0  I  have been so unhappy that I have had difficulty sleeping. 0  I have felt sad or miserable. 1  I have been so unhappy that I have been crying. 0  The thought of harming myself has occurred to me. 0  Edinburgh Postnatal Depression Scale Total 4     After visit meds:  Allergies as of 12/27/2020       Reactions   Hydrocodone Itching        Medication List     STOP taking these medications    Accu-Chek Guide test strip Generic drug: glucose blood   Accu-Chek Guide w/Device Kit   Accu-Chek Softclix Lancets lancets       TAKE these medications    ibuprofen 600 MG tablet Commonly known as: ADVIL Take 1 tablet (600 mg total) by mouth every 6 (six) hours.   PrePLUS 27-1 MG Tabs Take 1 tablet by mouth daily.         Discharge home in stable  condition Infant Feeding: Breast Infant Disposition:home with mother Discharge instruction: per After Visit Summary and Postpartum booklet. Activity: Advance as tolerated. Pelvic rest for 6 weeks.  Diet: routine diet Future Appointments: Future Appointments  Date Time Provider [place]  01/01/2021 10:40 AM [facility] NURSE [facility] None  02/05/2021  9:15 AM [facility] LAB [facility] None  02/05/2021 10:15 AM Chancy Milroy, MD [place] None   Follow up Visit:   Please schedule this patient for a In person postpartum visit in 4 weeks with the following provider: Any provider. Additional Postpartum F/U:2 hour GTT and BP check 1 week  Low risk pregnancy complicated by: GDM and HTN Delivery mode:  Vaginal, Spontaneous  Anticipated Birth Control:  Unsure # BP's normalized PP  12/27/2020 Noni Saupe, NP

## 2020-12-25 NOTE — Progress Notes (Signed)
Labor Progress Note CRYSTEL DEMARCO is a 30 y.o. [id number] at [redacted]w[redacted]d presented for IOL due to mild pre-eclampsia and GDM.   S: Doing well. No concerns.   O:  BP 119/62 (BP Location: Left Arm)   Pulse 87   Temp 97.9 F (36.6 C) (Oral)   Resp 17   Ht 5\' 6"  (1.676 m)   Wt 119.9 kg   LMP 03/29/2020   SpO2 99%   BMI 42.66 kg/m   EFM: Baseline 140, moderate variability, + accels, no decels   CVE: Dilation: 4 Effacement (%): 50 Cervical Position: Posterior Station: Ballotable Presentation: Vertex Exam by:: [email or web address], RN   A&P: 30 y.o. 26 [redacted]w[redacted]d   #Labor: Progressing well. Foley balloon dislodged. Will start Pitocin 2x2 and reassess in 3-4 hours. Consider AROM next check given still ballotable at this time. #Pain: PRN; planning for epidural  #FWB: Cat 1  #GBS negative  #Mild pre-eclampsia: BP remains normal range. No symptoms. Continue to monitor.   #A1GDM: CBGs normal. Continue Q4hr glucose checks.   [redacted]w[redacted]d, MD 4:10 AM

## 2020-12-25 NOTE — Progress Notes (Signed)
Patient Vitals for the past 4 hrs:  BP Temp Temp src Pulse Resp  12/25/20 1732 (!) 151/70 -- -- 84 16  12/25/20 1730 -- 99.8 F (37.7 C) Oral -- --  12/25/20 1532 133/84 -- -- 87 15  12/25/20 1416 -- 98.3 F (36.8 C) Oral -- --  12/25/20 1402 96/60 -- -- 84 --   Blood sugars 67-95 today. No feelings of pressure/pain at all.  Cx now C/C/0. Ctx q 3-4 minutes, pitocin at 18 mu/ min.  Had pushed w/the nurse for about 20 minutes, baby didn't move at all w/pushing.  Will labor down and ^ pitocin until ctx q 2-3 minutes.

## 2020-12-25 NOTE — Anesthesia Procedure Notes (Signed)
Epidural Patient location during procedure: OB Start time: 12/25/2020 10:26 AM End time: 12/25/2020 10:32 AM  Staffing Anesthesiologist: Bethena Midget, MD  Preanesthetic Checklist Completed: patient identified, IV checked, site marked, risks and benefits discussed, surgical consent, monitors and equipment checked, pre-op evaluation and timeout performed  Epidural Patient position: sitting Prep: DuraPrep and site prepped and draped Patient monitoring: continuous pulse ox and blood pressure Approach: midline Location: L4-L5 Injection technique: LOR air  Needle:  Needle type: Tuohy  Needle gauge: 17 G Needle length: 9 cm and 9 Needle insertion depth: 6 cm Catheter type: closed end flexible Catheter size: 19 Gauge Catheter at skin depth: 12 cm Test dose: negative  Assessment Events: blood not aspirated, injection not painful, no injection resistance, no paresthesia and negative IV test

## 2020-12-25 NOTE — Progress Notes (Signed)
Patient Vitals for the past 4 hrs:  BP Pulse Resp  12/25/20 1042 133/70 88 17  12/25/20 1037 131/76 89 16  12/25/20 0932 (!) 97/53 95 15  12/25/20 0902 131/70 92 16  12/25/20 0856 124/76 91 16  12/25/20 0801 118/68 84 15   Comfortable w/epidural.  AROM around 11 w/clear fluid.  Cx 5/60/-2.  Ptiocin at 8 mu/min.  Ctx q 2-4 minutes.  Will increase pitocin until labor adequate

## 2020-12-25 NOTE — Lactation Note (Signed)
This note was copied from a baby's chart. Lactation Consultation Note  Patient Name: Girl Aashritha Miedema FMBBU'Y Date: 12/25/2020   Age:30 hours LC entered the room, RN ask LC services to come back later, RN Troy Sine) will call [facility] services on Vocera when mom is ready to latch infant at the breast.  Maternal Data    Feeding    LATCH Score                    Lactation Tools Discussed/Used    Interventions    Discharge    Consult Status      Danelle Earthly 12/25/2020, 10:52 PM

## 2020-12-26 ENCOUNTER — Encounter (HOSPITAL_COMMUNITY): Payer: Self-pay | Admitting: Obstetrics and Gynecology

## 2020-12-26 MED ORDER — BENZOCAINE-MENTHOL 20-0.5 % EX AERO: 1.0000 "application " | INHALATION_SPRAY | CUTANEOUS | Status: DC | PRN

## 2020-12-26 MED ORDER — DIBUCAINE (PERIANAL) 1 % EX OINT: 1.0000 "application " | TOPICAL_OINTMENT | CUTANEOUS | Status: DC | PRN

## 2020-12-26 MED ORDER — MEDROXYPROGESTERONE ACETATE 150 MG/ML IM SUSP: 150.0000 mg | INTRAMUSCULAR | Status: DC | PRN

## 2020-12-26 MED ORDER — MEASLES, MUMPS & RUBELLA VAC IJ SOLR: 0.5000 mL | Freq: Once | INTRAMUSCULAR | Status: DC

## 2020-12-26 MED ORDER — SENNOSIDES-DOCUSATE SODIUM 8.6-50 MG PO TABS
2.0000 | ORAL_TABLET | Freq: Every day | ORAL | Status: DC
  Administered 2020-12-26: 2 via ORAL
  Filled 2020-12-26: qty 2

## 2020-12-26 MED ORDER — WITCH HAZEL-GLYCERIN EX PADS: 1.0000 "application " | MEDICATED_PAD | CUTANEOUS | Status: DC | PRN

## 2020-12-26 MED ORDER — IBUPROFEN 600 MG PO TABS
600.0000 mg | ORAL_TABLET | Freq: Four times a day (QID) | ORAL | Status: DC
  Administered 2020-12-26 – 2020-12-27 (×5): 600 mg via ORAL
  Filled 2020-12-26 (×4): qty 1

## 2020-12-26 MED ORDER — PRENATAL MULTIVITAMIN CH
1.0000 | ORAL_TABLET | Freq: Every day | ORAL | Status: DC
  Administered 2020-12-26: 1 via ORAL
  Filled 2020-12-26: qty 1

## 2020-12-26 MED ORDER — ONDANSETRON HCL 4 MG PO TABS: 4.0000 mg | ORAL_TABLET | ORAL | Status: DC | PRN

## 2020-12-26 MED ORDER — DIPHENHYDRAMINE HCL 25 MG PO CAPS: 25.0000 mg | ORAL_CAPSULE | Freq: Four times a day (QID) | ORAL | Status: DC | PRN

## 2020-12-26 MED ORDER — TETANUS-DIPHTH-ACELL PERTUSSIS 5-2.5-18.5 LF-MCG/0.5 IM SUSY: 0.5000 mL | PREFILLED_SYRINGE | Freq: Once | INTRAMUSCULAR | Status: DC

## 2020-12-26 MED ORDER — FERROUS SULFATE 325 (65 FE) MG PO TABS
325.0000 mg | ORAL_TABLET | ORAL | Status: DC
  Administered 2020-12-26: 325 mg via ORAL
  Filled 2020-12-26: qty 1

## 2020-12-26 MED ORDER — ONDANSETRON HCL 4 MG/2ML IJ SOLN: 4.0000 mg | INTRAMUSCULAR | Status: DC | PRN

## 2020-12-26 MED ORDER — ACETAMINOPHEN 325 MG PO TABS
650.0000 mg | ORAL_TABLET | ORAL | Status: DC | PRN
  Administered 2020-12-26 (×3): 650 mg via ORAL
  Filled 2020-12-26 (×2): qty 2

## 2020-12-26 MED ORDER — SIMETHICONE 80 MG PO CHEW: 80.0000 mg | CHEWABLE_TABLET | ORAL | Status: DC | PRN

## 2020-12-26 MED ORDER — COCONUT OIL OIL: 1.0000 "application " | TOPICAL_OIL | Status: DC | PRN

## 2020-12-26 NOTE — Anesthesia Postprocedure Evaluation (Signed)
Anesthesia Post Note  Patient: Karen Mcmahon  Procedure(s) Performed: AN AD HOC LABOR EPIDURAL     Patient location during evaluation: Mother Baby Anesthesia Type: Epidural Level of consciousness: awake Pain management: satisfactory to patient Vital Signs Assessment: post-procedure vital signs reviewed and stable Respiratory status: spontaneous breathing Cardiovascular status: stable Anesthetic complications: no   No notable events documented.  Last Vitals:  Vitals:   12/26/20 0115 12/26/20 0508  BP: 127/76 (!) 104/59  Pulse: 93 (!) 105  Resp: 18   Temp: 36.7 C 36.8 C  SpO2:  100%    Last Pain:  Vitals:   12/26/20 0930  TempSrc:   PainSc: 6    Pain Goal:                   [employer]

## 2020-12-26 NOTE — Lactation Note (Signed)
This note was copied from a baby's chart. Lactation Consultation Note  Patient Name: Karen Mcmahon PBDHD'I Date: 12/26/2020   Age:30 hours LC entered the room, mom and infant asleep at this time.  Maternal Data    Feeding    LATCH Score Latch: Repeated attempts needed to sustain latch, nipple held in mouth throughout feeding, stimulation needed to elicit sucking reflex.  Audible Swallowing: A few with stimulation  Type of Nipple: Everted at rest and after stimulation  Comfort (Breast/Nipple): Soft / non-tender  Hold (Positioning): Assistance needed to correctly position infant at breast and maintain latch.  LATCH Score: 7   Lactation Tools Discussed/Used    Interventions    Discharge    Consult Status      Karen Mcmahon 12/26/2020, 1:16 AM

## 2020-12-26 NOTE — Progress Notes (Signed)
Called L&D RN to remove epidural from back. RN with another patient and to call back.

## 2020-12-26 NOTE — Lactation Note (Signed)
This note was copied from a baby's chart. Lactation Consultation Note  Patient Name: Karen Mcmahon Stlouis TJQZE'S Date: 12/26/2020 Reason for consult: Initial assessment;Difficult latch;1st time breastfeeding;Term;Maternal endocrine disorder;Breastfeeding assistance Age:30 hours  Mom nipples are sore. No signs of abrasions or trauma. RN to provided coconut oil for nipple care. LC attempted latch but Mom decided too painful before infant could be adjusted.   Mom plan going forward to pump and bottle feed. Infant last feeding at 9:30 am. Infant brining up some amniotic fluid but swallowed it back down. Mom not able to feed even with bottle. RN, Wayna Chalet alerted.   Plan 1. To feed based on cues 8-12x in 24 hr period  2. Mom supplement with EBM first followed by formula. Volume guide reviewed, Mom aware not latching infant take 15 ml or more as tolerated.  3. DEBP q 3hrs for .  4. I and O sheet reviewed.  All questions answered at the end of the visit.   Maternal Data Has patient been taught Hand Expression?: Yes Does the patient have breastfeeding experience prior to this delivery?: No  Feeding Mother's Current Feeding Choice: Breast Milk and Formula  LATCH Score                    Lactation Tools Discussed/Used Tools: Pump;Flanges;Coconut oil (RN to provided coconut for nipple care.) Flange Size: 24;27 Breast pump type: Double-Electric Breast Pump Pump Education: Setup, frequency, and cleaning;Milk Storage Reason for Pumping: increase stimulation Pumping frequency: every 3 hrs for  Interventions Interventions: Breast feeding basics reviewed;Support pillows;Education;Assisted with latch;Skin to skin;Expressed [employer];Infant Driven Feeding Algorithm education;DEBP  Discharge Pump: Personal  Consult Status Consult Status: Follow-up Date: 12/27/20 Follow-up type: In-patient    Bostyn Kunkler  Nicholson-Springer 12/26/2020, 2:36 PM

## 2020-12-26 NOTE — Progress Notes (Signed)
Post Partum Day 1 Subjective: no complaints, up ad lib, voiding, tolerating PO, and + flatus  Objective: Blood pressure (!) 104/59, pulse (!) 105, temperature 98.2 F (36.8 C), temperature source Oral, resp. rate 18, height 5\' 6"  (1.676 m), weight 119.9 kg, last menstrual period 03/29/2020, SpO2 100 %, unknown if currently breastfeeding.  Physical Exam:  General: alert Lochia: appropriate Uterine Fundus: firm Incision: n/a DVT Evaluation: No evidence of DVT seen on physical exam. Negative Homan's sign. No cords or calf tenderness. No significant calf/ankle edema.  Recent Labs    12/25/20 0938 12/25/20 2303  HGB 12.5 12.5  HCT 37.7 37.6    Assessment/Plan: Patient is 30 year old G4 now P2022 who presented for IOL due to mild pre-eclampsia and A1GDM on 12/24/20 and who is now PPD1 s/p SVD. Progressing appropriately. #MOF: breastfeeding, continue lactation support #MOC: undecided, continue conversation until discharge #Mild pre-eclampsia without severe features: BP appropriate overnight. Will continue to monitor. #A1GDM: Needs fasting CBG, pending for tomorrow morning at 5 am. Glucoses have been in control.   Plan for discharge tomorrow and Breastfeeding   LOS: 2 days   12/26/20 12/26/2020, 7:12 AM

## 2020-12-27 ENCOUNTER — Inpatient Hospital Stay (HOSPITAL_COMMUNITY)
Admission: AD | Admit: 2020-12-27 | Payer: Medicaid Other | Source: Home / Self Care | Admitting: Obstetrics & Gynecology

## 2020-12-27 ENCOUNTER — Inpatient Hospital Stay (HOSPITAL_COMMUNITY): Payer: Medicaid Other

## 2020-12-27 LAB — GLUCOSE, CAPILLARY: Glucose-Capillary: 83 mg/dL (ref 70–99)

## 2020-12-27 MED ORDER — IBUPROFEN 600 MG PO TABS: 600.0000 mg | ORAL_TABLET | Freq: Four times a day (QID) | ORAL | 0 refills | Status: DC

## 2020-12-27 NOTE — Lactation Note (Signed)
This note was copied from a baby's chart. Lactation Consultation Note  Patient Name: Karen Mcmahon VVZSM'O Date: 12/27/2020 Reason for consult: Follow-up assessment;Term;Infant weight loss;Other (Comment) (3 % weight loss / mom plans to pump and bottle feed . LC reviewed supply and demand / importance of pumping both breast 8-10 times a day/ save milk for the next feeding. BF D/C teaching done ( see below ) .) Age:30 hours  Maternal Data    Feeding Mother's Current Feeding Choice: Breast Milk and Formula Nipple Type: Slow - flow  LATCH Score                    Lactation Tools Discussed/Used Breast pump type: Double-Electric Breast Pump Pump Education: Milk Storage  Interventions Interventions: Breast feeding basics reviewed;Education;LC Services brochure;DEBP  Discharge Discharge Education: Engorgement and breast care;Warning signs for feeding baby Pump: Personal;DEBP  Consult Status Consult Status: Complete Date: 12/27/20    Matilde Sprang Kimble Delaurentis 12/27/2020, 9:09 AM

## 2021-01-01 ENCOUNTER — Ambulatory Visit (INDEPENDENT_AMBULATORY_CARE_PROVIDER_SITE_OTHER): Payer: Medicaid Other

## 2021-01-01 ENCOUNTER — Other Ambulatory Visit: Payer: Self-pay

## 2021-01-01 DIAGNOSIS — Z0130 Encounter for examination of blood pressure without abnormal findings: Secondary | ICD-10-CM

## 2021-01-01 MED ORDER — NIFEDIPINE ER OSMOTIC RELEASE 30 MG PO TB24: 30.0000 mg | ORAL_TABLET | Freq: Every day | ORAL | 0 refills | Status: DC

## 2021-01-01 MED ORDER — FUROSEMIDE 20 MG PO TABS: 20.0000 mg | ORAL_TABLET | Freq: Every day | ORAL | 0 refills | Status: DC

## 2021-01-01 NOTE — Progress Notes (Signed)
Patient was assessed and managed by nursing staff during this encounter. I have reviewed the chart and agree with the documentation and plan. I have also made any necessary editorial changes.  Catalina Antigua, MD 01/01/2021 12:25 PM

## 2021-01-01 NOTE — Progress Notes (Signed)
Subjective:  Karen Mcmahon is a 30 y.o. female here for BP check.   Hypertension ROS: taking medications as instructed, no medication side effects noted, no TIA's, no chest pain on exertion, no dyspnea on exertion, and no swelling of ankles.    Objective:  LMP 03/29/2020   Appearance alert, well appearing, and in no distress. General exam BP noted to be well controlled today in office.    Assessment:   Blood Pressure  elevated today and is taking ibuprofen for intermittent mild headaches .   Plan:  Patient started on Procardia 30 daily and lasix for bilateral pitting edema in legs. Patient to return in 1 week for repeat BP check.

## 2021-01-06 ENCOUNTER — Telehealth (HOSPITAL_COMMUNITY): Payer: Self-pay | Admitting: *Deleted

## 2021-01-06 NOTE — Telephone Encounter (Signed)
Attempted hospital discharge follow-up call. Left message for patient to return RN call. Deforest Hoyles, RN, 01/06/21, [phone]

## 2021-01-08 ENCOUNTER — Ambulatory Visit: Payer: Medicaid Other

## 2021-01-12 ENCOUNTER — Ambulatory Visit (INDEPENDENT_AMBULATORY_CARE_PROVIDER_SITE_OTHER): Payer: Medicaid Other

## 2021-01-12 ENCOUNTER — Other Ambulatory Visit: Payer: Self-pay

## 2021-01-12 DIAGNOSIS — Z0130 Encounter for examination of blood pressure without abnormal findings: Secondary | ICD-10-CM

## 2021-01-12 NOTE — Progress Notes (Signed)
Patient was assessed and managed by nursing staff during this encounter. I have reviewed the chart and agree with the documentation and plan. I have also made any necessary editorial changes.  Warden Fillers, MD 01/12/2021 1:03 PM

## 2021-01-12 NOTE — Progress Notes (Signed)
Subjective:  Karen Mcmahon is a 30 y.o. female here for BP check.   Hypertension ROS: taking medications as instructed, no medication side effects noted, no TIA's, no chest pain on exertion, no dyspnea on exertion, and no swelling of ankles.    Objective:  LMP 03/29/2020   Appearance alert, well appearing, and in no distress. General exam BP noted to be well controlled today in office.    Assessment:   Blood Pressure well controlled.   Plan:  Current treatment plan is effective, no change in therapy.Marland Kitchen

## 2021-02-05 ENCOUNTER — Other Ambulatory Visit: Payer: Medicaid Other

## 2021-02-05 ENCOUNTER — Ambulatory Visit: Payer: Medicaid Other | Admitting: Obstetrics and Gynecology

## 2021-03-17 ENCOUNTER — Other Ambulatory Visit: Payer: Medicaid Other

## 2021-03-17 ENCOUNTER — Ambulatory Visit (INDEPENDENT_AMBULATORY_CARE_PROVIDER_SITE_OTHER): Payer: Medicaid Other | Admitting: Obstetrics & Gynecology

## 2021-03-17 ENCOUNTER — Encounter: Payer: Self-pay | Admitting: Obstetrics & Gynecology

## 2021-03-17 ENCOUNTER — Other Ambulatory Visit: Payer: Self-pay

## 2021-03-17 VITALS — BP 114/78 | HR 73 | Ht 67.0 in | Wt 237.0 lb

## 2021-03-17 DIAGNOSIS — M545 Low back pain, unspecified: Secondary | ICD-10-CM

## 2021-03-17 DIAGNOSIS — N912 Amenorrhea, unspecified: Secondary | ICD-10-CM

## 2021-03-17 DIAGNOSIS — G8929 Other chronic pain: Secondary | ICD-10-CM

## 2021-03-17 LAB — POCT URINE PREGNANCY: Preg Test, Ur: NEGATIVE

## 2021-03-17 NOTE — Progress Notes (Signed)
° ° °  Post Partum Visit Note  Karen Mcmahon is a 31 y.o. [phone] female who presents for a postpartum visit. She is  11  weeks postpartum following a normal spontaneous vaginal delivery.  I have fully reviewed the prenatal and intrapartum course. The delivery was at 38 gestational weeks.  Anesthesia: epidural. Postpartum course has been uncomplicated. Baby is doing well. Baby is feeding by bottle - Octavia Heir . Bleeding staining only. Bowel function is normal. Bladder function is normal. Patient is sexually active. Contraception method is none. Postpartum depression screening: negative. Patient states that she is still dealing with grief from her father passing way and getting adjusted with baby being at home. Patient states that she does have good support at home.   The pregnancy intention screening data noted above was reviewed. Potential methods of contraception were discussed. The patient elected to proceed with No data recorded.    Health Maintenance Due  Topic Date Due   COVID-19 Vaccine (1) Never done   Pneumococcal Vaccine 38-35 Years old (1 - PCV) Never done   URINE MICROALBUMIN  Never done   INFLUENZA VACCINE  Never done    The following portions of the patient's history were reviewed and updated as appropriate: allergies, current medications, past family history, past medical history, past social history, past surgical history, and problem list.  Review of Systems Pertinent items are noted in HPI. Back pain Objective:  LMP 03/29/2020    General:  alert, cooperative, and no distress   Breasts:    Lungs: Effort normal  Heart:  regular rate and rhythm  Abdomen: soft, non-tender; bowel sounds normal; no masses,  no organomegaly   Wound   GU exam:  not indicated       Assessment:    There are no diagnoses linked to this encounter.  Normal postpartum exam. MS back pain  Plan:   Essential components of care per ACOG recommendations:  1.  Mood and well being: Patient  with negative depression screening today. Reviewed local resources for support.  - Patient tobacco use? No.   - hx of drug use? No.    2. Infant care and feeding:  -Patient currently breastmilk feeding? No.  -Social determinants of health (SDOH) reviewed in EPIC. No concerns  3. Sexuality, contraception and birth spacing - Patient does not want a pregnancy in the next year.  Desired family size is 3 children.  - Reviewed forms of contraception in tiered fashion. Patient desired no method today.   - Discussed birth spacing of 18 months  4. Sleep and fatigue -Encouraged family/partner/community support of 4 hrs of uninterrupted sleep to help with mood and fatigue  5. Physical Recovery  - Discussed patients delivery and complications. She describes her labor as good. - Patient had a Vaginal, no problems at delivery. Patient had no laceration. Perineal healing reviewed. Patient expressed understanding - Patient has urinary incontinence? No. - Patient is safe to resume physical and sexual activity  6.  Health Maintenance - HM due items addressed Yes GDM - Last pap smear  Diagnosis  Date Value Ref Range Status  05/18/2018   Final   NEGATIVE FOR INTRAEPITHELIAL LESIONS OR MALIGNANCY.   Pap smear not done at today's visit.  -Breast Cancer screening indicated? No.   7. Chronic Disease/Pregnancy Condition follow up: None  - PCP follow up  Adam Phenix, MD  Center for [facility], [facility]

## 2021-03-17 NOTE — Progress Notes (Signed)
PPD screening: 9.Patient states that she is still dealing with grief from her father passing way and getting adjusted with baby being at home. Patient states that she does have good support at home. Offered for patient to see Seth Bake to discuss issues, but patient declines.  Patient also complains of having back pain from her epidural.   Patient unable to do PP GTT due to having cereal this am.  UPT performed due to having unprotected intercourse with no birth control. She has not had a true cycle since delivery.

## 2021-03-19 ENCOUNTER — Encounter: Payer: Medicaid Other | Attending: Obstetrics & Gynecology | Admitting: Physical Therapy

## 2021-03-23 ENCOUNTER — Other Ambulatory Visit: Payer: Medicaid Other

## 2021-03-23 ENCOUNTER — Other Ambulatory Visit: Payer: Self-pay

## 2021-03-23 DIAGNOSIS — Z8632 Personal history of gestational diabetes: Secondary | ICD-10-CM

## 2021-03-24 LAB — GLUCOSE TOLERANCE, 2 HOURS
Glucose, 2 hour: 125 mg/dL (ref 70–139)
Glucose, GTT - Fasting: 100 mg/dL — ABNORMAL HIGH (ref 70–99)

## 2021-03-25 ENCOUNTER — Encounter: Payer: Self-pay | Admitting: *Deleted

## 2021-03-25 ENCOUNTER — Encounter (HOSPITAL_COMMUNITY): Payer: Self-pay | Admitting: Emergency Medicine

## 2021-03-25 ENCOUNTER — Other Ambulatory Visit: Payer: Self-pay | Admitting: *Deleted

## 2021-03-25 ENCOUNTER — Other Ambulatory Visit: Payer: Self-pay

## 2021-03-25 ENCOUNTER — Ambulatory Visit (HOSPITAL_COMMUNITY)
Admission: EM | Admit: 2021-03-25 | Discharge: 2021-03-25 | Disposition: A | Discharge status: Home / Self Care | Payer: Medicaid Other | Attending: Family Medicine | Admitting: Family Medicine

## 2021-03-25 DIAGNOSIS — M545 Low back pain, unspecified: Secondary | ICD-10-CM

## 2021-03-25 MED ORDER — MELOXICAM 15 MG PO TABS: 15.0000 mg | ORAL_TABLET | Freq: Every day | ORAL | 0 refills | Status: DC

## 2021-03-25 NOTE — Progress Notes (Signed)
Patient informed of failed postpartum GTT and diabetes diagnosis. Advised to seek care with a PCP for management of diabetes. Advised of importance of management of DM to prevent health complications. Given PCP resources and referral to [facility] placed.

## 2021-03-25 NOTE — Progress Notes (Signed)
TC to notify patient of failed postpartum GTT per Dr. Debroah Loop. Advised of diabetes diagnosis and importance of establishing care with PCP for management to avoid health complications. Ambulatory referral to PCP/ [facility] placed. Advised patient of resources on [facility] website for finding PCP. MyChart message sent with additional resources. Patient verbalized understanding.

## 2021-03-25 NOTE — Discharge Instructions (Signed)
As we discussed, for your back pain I sent a prescription for meloxicam.  You can take this once daily for the next 5 days, then daily as needed.  Take it with food.  Do not take with ibuprofen, Advil, Aleve.  I have also given you some exercises to do for your low back which can help with your pain.  You can continue heating pads or icy hot if you find this helpful.  Follow-up with sports medicine in 1 to 2 weeks if you are not improving.  Go to the emergency room immediately if you have numbness and tingling in your groin, difficulty urinating, weakness on one side of your body or the other.

## 2021-03-25 NOTE — ED Triage Notes (Signed)
PT reports lower middle back pain that started Saturday. No radiation down legs, no known injury.

## 2021-03-25 NOTE — ED Provider Notes (Signed)
[facility]    CSN: [id number] Arrival date & time: 03/25/21  9892      History   Chief Complaint Chief Complaint  Patient presents with   Back Pain    HPI Karen Mcmahon is a 31 y.o. female.   Low Back Pain Central, but not tender Started on 1/21 when awoke from sleep No injury Has had occasional back pain off and on in the past, but not in the low back Has tried icy hot, heating pad, ibuprofen without improvement She denies any saddle anesthesias, changes in bowel or bladder habits, leg weakness, fever No radiation of the pain No numbness and tingling Otherwise in her usual state of health   Past Medical History:  Diagnosis Date   Gestational diabetes    Hx of gonorrhea    Keloid    Left Upper Lateral Earlobe   Malignant hyperthermia    Pregnant    hx   Trichomonas contact, treated     Patient Active Problem List   Diagnosis Date Noted   Alpha thalassemia silent carrier 06/24/2020   History of recurrent vaginal discharge 11/30/2018   Left arm pain 11/15/2018   Keloid scar of skin 03/22/2012    Past Surgical History:  Procedure Laterality Date   EAR CYST EXCISION Left 04/05/2014   Procedure: EXCISION EAR CYST;  Surgeon: Melvenia Beam, MD;  Location: [facility] OR;  Service: ENT;  Laterality: Left;  Excision of keloid   EXTERNAL EAR SURGERY Left 2012    OB History     Gravida  4   Para  2   Term  2   Preterm      AB  2   Living  2      SAB  1   IAB  1   Ectopic      Multiple  0   Live Births  2            Home Medications    Prior to Admission medications   Medication Sig Start Date End Date Taking? Authorizing Provider  meloxicam (MOBIC) 15 MG tablet Take 1 tablet (15 mg total) by mouth daily. 03/25/21  Yes Areen Trautner, Solmon Ice, DO  NIFEdipine (PROCARDIA-XL/NIFEDICAL-XL) 30 MG 24 hr tablet Take 1 tablet (30 mg total) by mouth daily. Can increase to twice a day as needed for symptomatic contractions 01/01/21  Yes  Constant, Peggy, MD  Prenatal Vit-Fe Fumarate-FA (PREPLUS) 27-1 MG TABS Take 1 tablet by mouth daily. 06/09/20  Yes Constant, Gigi Gin, MD    Family History Family History  Problem Relation Age of Onset   Kidney disease Mother        was on dialysis, rec'd a new kidney   Diabetes Mother    Hypertension Mother    Cancer Father        pancreatic   Pancreatic cancer Father    Diabetes Maternal Grandmother    Hypertension Maternal Grandmother    Other Neg Hx     Social History Social History   Tobacco Use   Smoking status: Former    Packs/day: 0.50    Years: 2.00    Pack years: 1.00    Types: Cigarettes    Quit date: 03/18/2014    Years since quitting: 7.0   Smokeless tobacco: Never  Vaping Use   Vaping Use: Former  Substance Use Topics   Alcohol use: Not Currently    Comment: 1 mixed drink daily. Not since confirmed pregnancy   Drug use:  Not Currently    Types: Marijuana    Comment: not since + preg     Allergies   Hydrocodone   Review of Systems Review of Systems  All other systems reviewed and are negative. Per HPI  Physical Exam Triage Vital Signs ED Triage Vitals  Enc Vitals Group     BP 03/25/21 0950 115/78     Pulse Rate 03/25/21 0950 71     Resp 03/25/21 0950 16     Temp 03/25/21 0950 98.5 F (36.9 C)     Temp Source 03/25/21 0950 Oral     SpO2 03/25/21 0950 97 %     Weight --      Height --      Head Circumference --      Peak Flow --      Pain Score 03/25/21 0948 8     Pain Loc --      Pain Edu? --      Excl. in GC? --    No data found.  Updated Vital Signs BP 115/78    Pulse 71    Temp 98.5 F (36.9 C) (Oral)    Resp 16    SpO2 97%    Breastfeeding No   Visual Acuity Right Eye Distance:   Left Eye Distance:   Bilateral Distance:    Right Eye Near:   Left Eye Near:    Bilateral Near:     Physical Exam Constitutional:      General: She is not in acute distress.    Appearance: Normal appearance. She is not ill-appearing or  toxic-appearing.  HENT:     Head: Normocephalic and atraumatic.  Eyes:     Conjunctiva/sclera: Conjunctivae normal.  Cardiovascular:     Rate and Rhythm: Normal rate.  Pulmonary:     Effort: Pulmonary effort is normal. No respiratory distress.  Musculoskeletal:        General: No swelling.     Cervical back: Normal range of motion and neck supple.     Comments: Lumbar spine: - Inspection: no gross deformity or asymmetry, swelling or ecchymosis - Palpation: No TTP over the spinous processes, paraspinal muscles, or SI joints b/l - ROM: full active ROM of the lumbar spine in flexion and extension with some mild pain with extension - Strength: 5/5 strength of lower extremity in L4-S1 nerve root distributions b/l; normal gait - Neuro: sensation intact in the L4-S1 nerve root distribution b/l, 2+ L4 and S1 reflexes - Special testing: negative slump    Skin:    General: Skin is warm and dry.     Capillary Refill: Capillary refill takes less than 2 seconds.  Neurological:     General: No focal deficit present.     Mental Status: She is alert and oriented to person, place, and time.     Cranial Nerves: No cranial nerve deficit.     Sensory: No sensory deficit.     Motor: No weakness.     Gait: Gait normal.     Deep Tendon Reflexes: Reflexes normal.  Psychiatric:        Mood and Affect: Mood normal.        Behavior: Behavior normal.     UC Treatments / Results  Labs (all labs ordered are listed, but only abnormal results are displayed) Labs Reviewed - No data to display  EKG   Radiology No results found.  Procedures Procedures (including critical care time)  Medications Ordered in UC Medications - No  data to display  Initial Impression / Assessment and Plan / UC Course  I have reviewed the triage vital signs and the nursing notes.  Pertinent labs & imaging results that were available during my care of the patient were reviewed by me and considered in my medical  decision making (see chart for details).     Acute onset of muscular low back pain, no indication for imaging at this time.  Rx sent for meloxicam.  Assured the patient is not currently breast-feeding.  Advised to continue topical analgesics.  Given Williams flexion exercises to perform.  Recommend follow-up with sports medicine if not improving over the next 1 to 2 weeks.  Given ED precautions, see AVS.   Final Clinical Impressions(s) / UC Diagnoses   Final diagnoses:  Acute midline low back pain without sciatica     Discharge Instructions      As we discussed, for your back pain I sent a prescription for meloxicam.  You can take this once daily for the next 5 days, then daily as needed.  Take it with food.  Do not take with ibuprofen, Advil, Aleve.  I have also given you some exercises to do for your low back which can help with your pain.  You can continue heating pads or icy hot if you find this helpful.  Follow-up with sports medicine in 1 to 2 weeks if you are not improving.  Go to the emergency room immediately if you have numbness and tingling in your groin, difficulty urinating, weakness on one side of your body or the other.     ED Prescriptions     Medication Sig Dispense Auth. Provider   meloxicam (MOBIC) 15 MG tablet Take 1 tablet (15 mg total) by mouth daily. 30 tablet [place], Solmon Ice, DO      PDMP not reviewed this encounter.   MeccarielloSolmon Ice, DO 03/25/21 1016

## 2021-03-26 ENCOUNTER — Encounter: Payer: Medicaid Other | Admitting: Physical Therapy

## 2021-04-02 ENCOUNTER — Encounter: Payer: Medicaid Other | Admitting: Physical Therapy

## 2021-04-16 ENCOUNTER — Encounter: Payer: Medicaid Other | Admitting: Physical Therapy

## 2021-05-19 ENCOUNTER — Other Ambulatory Visit (HOSPITAL_COMMUNITY)
Admission: RE | Admit: 2021-05-19 | Discharge: 2021-05-19 | Disposition: A | Discharge status: Home / Self Care | Payer: Medicaid Other | Source: Ambulatory Visit | Attending: Obstetrics | Admitting: Obstetrics

## 2021-05-19 ENCOUNTER — Other Ambulatory Visit: Payer: Self-pay

## 2021-05-19 ENCOUNTER — Ambulatory Visit (INDEPENDENT_AMBULATORY_CARE_PROVIDER_SITE_OTHER): Payer: Medicaid Other | Admitting: Obstetrics

## 2021-05-19 ENCOUNTER — Encounter: Payer: Self-pay | Admitting: Obstetrics

## 2021-05-19 VITALS — BP 116/72 | HR 79 | Ht 67.0 in | Wt 229.0 lb

## 2021-05-19 DIAGNOSIS — Z01419 Encounter for gynecological examination (general) (routine) without abnormal findings: Secondary | ICD-10-CM | POA: Insufficient documentation

## 2021-05-19 DIAGNOSIS — Z0000 Encounter for general adult medical examination without abnormal findings: Secondary | ICD-10-CM | POA: Diagnosis not present

## 2021-05-19 DIAGNOSIS — N898 Other specified noninflammatory disorders of vagina: Secondary | ICD-10-CM | POA: Diagnosis present

## 2021-05-19 DIAGNOSIS — Z3009 Encounter for other general counseling and advice on contraception: Secondary | ICD-10-CM

## 2021-05-19 DIAGNOSIS — E669 Obesity, unspecified: Secondary | ICD-10-CM

## 2021-05-19 MED ORDER — METRONIDAZOLE 500 MG PO TABS: 500.0000 mg | ORAL_TABLET | Freq: Two times a day (BID) | ORAL | 2 refills | Status: DC

## 2021-05-19 NOTE — Progress Notes (Signed)
31 y.o GYN presents for AEX/PAP.  C/o yellowish discharge x 5 days ?

## 2021-05-19 NOTE — Progress Notes (Addendum)
? ?Subjective: ? ? ?  ?  ? Karen Mcmahon is a 31 y.o. female here for a routine exam.  Current complaints: Yellowish vaginal discharge for the past 5 days.  Denies vaginal odor or irritation.   ? ?Personal health questionnaire:  ?Is patient Ashkenazi Jewish, have a family history of breast and/or ovarian cancer: no ?Is there a family history of uterine cancer diagnosed at age < 74, gastrointestinal cancer, urinary tract cancer, family member who is a Personnel officer syndrome-associated carrier: no ?Is the patient overweight and hypertensive, family history of diabetes, personal history of gestational diabetes, preeclampsia or PCOS: no ?Is patient over 67, have PCOS,  family history of premature CHD under age 45, diabetes, smoke, have hypertension or peripheral artery disease:  no ?At any time, has a partner hit, kicked or otherwise hurt or frightened you?: no ?Over the past 2 weeks, have you felt down, depressed or hopeless?: no ?Over the past 2 weeks, have you felt little interest or pleasure in doing things?:no ? ? ?Gynecologic History ?Patient's last menstrual period was 05/12/2021 (exact date). ?Contraception: none ?Last Pap: 2020. Results were: normal ?Last.mammogram: n/a.  Results were: n/a ? ?Obstetric History ?OB History  ?Gravida Para Term Preterm AB Living  ?4 2 2   2 2   ?SAB IAB Ectopic Multiple Live Births  ?1 1   0 2  ?  ?# Outcome Date GA Lbr Len/2nd Weight Sex Delivery Anes PTL Lv  ?4 Term 12/25/20 [redacted]w[redacted]d 03:09 / 04:04 9 lb 6 oz (4.252 kg) F Vag-Spont EPI  LIV  ?   Birth Comments: WDL  ?3 Term 03/02/12 [redacted]w[redacted]d 32:38 / 00:33 8 lb 13.6 oz (4.015 kg) M Vag-Spont EPI  LIV  ?   Birth Comments: caput  ?2 SAB 2011          ?1 IAB 2010 [redacted]w[redacted]d         ? ? ?Past Medical History:  ?Diagnosis Date  ? Gestational diabetes   ? Hx of gonorrhea   ? Keloid   ? Left Upper Lateral Earlobe  ? Malignant hyperthermia   ? Pregnant   ? hx  ? Trichomonas contact, treated   ?  ?Past Surgical History:  ?Procedure Laterality Date  ? EAR CYST  EXCISION Left 04/05/2014  ? Procedure: EXCISION EAR CYST;  Surgeon: 06/04/2014, MD;  Location: [facility] OR;  Service: ENT;  Laterality: Left;  Excision of keloid  ? EXTERNAL EAR SURGERY Left 2012  ?  ? ?Current Outpatient Medications:  ?  metroNIDAZOLE (FLAGYL) 500 MG tablet, Take 1 tablet (500 mg total) by mouth 2 (two) times daily., Disp: 14 tablet, Rfl: 2 ?  meloxicam (MOBIC) 15 MG tablet, Take 1 tablet (15 mg total) by mouth daily. (Patient not taking: Reported on 05/19/2021), Disp: 30 tablet, Rfl: 0 ?  NIFEdipine (PROCARDIA-XL/NIFEDICAL-XL) 30 MG 24 hr tablet, Take 1 tablet (30 mg total) by mouth daily. Can increase to twice a day as needed for symptomatic contractions (Patient not taking: Reported on 05/19/2021), Disp: 30 tablet, Rfl: 0 ?  Prenatal Vit-Fe Fumarate-FA (PREPLUS) 27-1 MG TABS, Take 1 tablet by mouth daily. (Patient not taking: Reported on 05/19/2021), Disp: 30 tablet, Rfl: 13 ?Allergies  ?Allergen Reactions  ? Hydrocodone Itching  ?  ?Social History  ? ?Tobacco Use  ? Smoking status: Former  ?  Packs/day: 0.50  ?  Years: 2.00  ?  Pack years: 1.00  ?  Types: Cigarettes  ?  Quit date: 03/18/2014  ?  Years since quitting: 7.1  ? Smokeless tobacco: Never  ?Substance Use Topics  ? Alcohol use: Not Currently  ?  Comment: 1 mixed drink daily. Not since confirmed pregnancy  ?  ?Family History  ?Problem Relation Age of Onset  ? Kidney disease Mother   ?     was on dialysis, rec'd a new kidney  ? Diabetes Mother   ? Hypertension Mother   ? Cancer Father   ?     pancreatic  ? Pancreatic cancer Father   ? Diabetes Maternal Grandmother   ? Hypertension Maternal Grandmother   ? Other Neg Hx   ?  ? ? ?Review of Systems ? ?Constitutional: negative for fatigue and weight loss ?Respiratory: negative for cough and wheezing ?Cardiovascular: negative for chest pain, fatigue and palpitations ?Gastrointestinal: negative for abdominal pain and change in bowel habits ?Musculoskeletal:negative for myalgias ?Neurological:  negative for gait problems and tremors ?Behavioral/Psych: negative for abusive relationship, depression ?Endocrine: negative for temperature intolerance    ?Genitourinary: positive for vaginal discharge.  negative for abnormal menstrual periods, genital lesions, hot flashes, sexual problems  ?Integument/breast: negative for breast lump, breast tenderness, nipple discharge and skin lesion(s) ? ?  ?Objective:  ? ?    ?BP 116/72   Pulse 79   Ht 5\' 7"  (1.702 m)   Wt 229 lb (103.9 kg)   LMP 05/12/2021 (Exact Date)   BMI 35.87 kg/m?  ?General:   Alert and no distress  ?Skin:   no rash or abnormalities  ?Lungs:   clear to auscultation bilaterally  ?Heart:   regular rate and rhythm, S1, S2 normal, no murmur, click, rub or gallop  ?Breasts:   normal without suspicious masses, skin or nipple changes or axillary nodes  ?Abdomen:  normal findings: no organomegaly, soft, non-tender and no hernia  ?Pelvis:  External genitalia: normal general appearance ?Urinary system: urethral meatus normal and bladder without fullness, nontender ?Vaginal: normal without tenderness, induration or masses ?Cervix: normal appearance ?Adnexa: normal bimanual exam ?Uterus: anteverted and non-tender, normal size  ? ?Lab Review ?Urine pregnancy test ?Labs reviewed yes ?Radiologic studies reviewed no ? ?I have spent a total of 20 minutes of face-to-face time, excluding clinical staff time, reviewing notes and preparing to see patient, ordering tests and/or medications, and counseling the patient.  ? ?Assessment:  ? ? 1. Encounter for gynecological examination with Papanicolaou smear of cervix ?Rx: ?- Cytology - PAP( [place]) ? ?2. Vaginal discharge ?Rx: ?- Cervicovaginal ancillary only( [place]) ?- metroNIDAZOLE (FLAGYL) 500 MG tablet; Take 1 tablet (500 mg total) by mouth 2 (two) times daily.  Dispense: 14 tablet; Refill: 2 ? ?3. Encounter for other general counseling and advice on contraception ?- declines contraception ? ?4. Obesity (BMI  35.0-39.9 without comorbidity) ?- weight reduction with the aid of dietary changes, exercises and behavioral modification recommended ?  ?  ?Plan:  ? ? Education reviewed: calcium supplements, depression evaluation, low fat, low cholesterol diet, safe sex/STD prevention, self breast exams, and weight bearing exercise. ?Contraception: Declines contraception.  Condoms recommended for STI prevention. ?Follow up in: 1 year.  ? ?Meds ordered this encounter  ?Medications  ? metroNIDAZOLE (FLAGYL) 500 MG tablet  ?  Sig: Take 1 tablet (500 mg total) by mouth 2 (two) times daily.  ?  Dispense:  14 tablet  ?  Refill:  2  ? ?- ? ? 01-29-1990, MD ?05/19/2021 4:07 PM  ?

## 2021-05-20 LAB — CERVICOVAGINAL ANCILLARY ONLY
Bacterial Vaginitis (gardnerella): NEGATIVE
Candida Glabrata: NEGATIVE
Candida Vaginitis: NEGATIVE
Chlamydia: NEGATIVE
Comment: NEGATIVE
Comment: NEGATIVE
Comment: NEGATIVE
Comment: NEGATIVE
Comment: NEGATIVE
Comment: NORMAL
Neisseria Gonorrhea: NEGATIVE
Trichomonas: NEGATIVE

## 2021-05-22 LAB — CYTOLOGY - PAP
Comment: NEGATIVE
Diagnosis: NEGATIVE
High risk HPV: NEGATIVE

## 2021-06-26 ENCOUNTER — Ambulatory Visit: Payer: Medicaid Other | Admitting: Obstetrics and Gynecology

## 2021-10-06 ENCOUNTER — Ambulatory Visit (HOSPITAL_COMMUNITY)
Admission: RE | Admit: 2021-10-06 | Discharge: 2021-10-06 | Disposition: A | Discharge status: Home / Self Care | Payer: Medicaid Other | Source: Ambulatory Visit | Attending: Physician Assistant | Admitting: Physician Assistant

## 2021-10-06 ENCOUNTER — Ambulatory Visit (HOSPITAL_COMMUNITY): Payer: Medicaid Other

## 2021-10-06 ENCOUNTER — Encounter (HOSPITAL_COMMUNITY): Payer: Self-pay

## 2021-10-06 VITALS — BP 106/75 | HR 81 | Temp 98.2°F | Resp 18

## 2021-10-06 DIAGNOSIS — M545 Low back pain, unspecified: Secondary | ICD-10-CM | POA: Insufficient documentation

## 2021-10-06 DIAGNOSIS — R1032 Left lower quadrant pain: Secondary | ICD-10-CM | POA: Insufficient documentation

## 2021-10-06 LAB — POCT URINALYSIS DIPSTICK, ED / UC
Bilirubin Urine: NEGATIVE
Glucose, UA: NEGATIVE mg/dL
Hgb urine dipstick: NEGATIVE
Ketones, ur: NEGATIVE mg/dL
Leukocytes,Ua: NEGATIVE
Nitrite: NEGATIVE
Protein, ur: NEGATIVE mg/dL
Specific Gravity, Urine: >=1.03 (ref 1.005–1.030)
Urobilinogen, UA: 0.2 mg/dL (ref 0.0–1.0)
pH: 5.5 (ref 5.0–8.0)

## 2021-10-06 LAB — POC URINE PREG, ED: Preg Test, Ur: NEGATIVE

## 2021-10-06 MED ORDER — NAPROXEN 375 MG PO TABS: 375.0000 mg | ORAL_TABLET | Freq: Two times a day (BID) | ORAL | 0 refills | Status: DC

## 2021-10-06 NOTE — ED Provider Notes (Signed)
[facility]    CSN: [id number] Arrival date & time: 10/06/21  1343      History   Chief Complaint Chief Complaint  Patient presents with   Abdominal Pain    Entered by patient   Back Pain    HPI Karen Mcmahon is a 31 y.o. female.   Patient presents today with a 1 day history of left lower quadrant abdominal pain.  She reports that pain is rated 8 on a 0-10 pain scale, described as aching, localized to her left lower quadrant with radiation to back, no aggravating relieving factors identified.  She has tried ibuprofen with minimal improvement of symptoms.  Denies any associated nausea, vomiting, change in bowel habits, melena, hematochezia.  Reports her last bowel movement was earlier today and was normal.  She denies any pelvic pain but does have some mild vaginal discharge though this is her baseline and unchanged.  She does report a history of ovarian cyst many years ago on the side but states current symptoms are not similar to previous episodes of this condition.  She denies any urinary symptoms.  Denies any recent antibiotics.  Denies any recent urogenital procedure, catheterization, history of nephrolithiasis.    Past Medical History:  Diagnosis Date   Gestational diabetes    Hx of gonorrhea    Keloid    Left Upper Lateral Earlobe   Malignant hyperthermia    Pregnant    hx   Trichomonas contact, treated     Patient Active Problem List   Diagnosis Date Noted   Alpha thalassemia silent carrier 06/24/2020   History of recurrent vaginal discharge 11/30/2018   Left arm pain 11/15/2018   Keloid scar of skin 03/22/2012    Past Surgical History:  Procedure Laterality Date   EAR CYST EXCISION Left 04/05/2014   Procedure: EXCISION EAR CYST;  Surgeon: Melvenia Beam, MD;  Location: [facility] OR;  Service: ENT;  Laterality: Left;  Excision of keloid   EXTERNAL EAR SURGERY Left 2012    OB History     Gravida  4   Para  2   Term  2   Preterm      AB  2    Living  2      SAB  1   IAB  1   Ectopic      Multiple  0   Live Births  2            Home Medications    Prior to Admission medications   Medication Sig Start Date End Date Taking? Authorizing Provider  naproxen (NAPROSYN) 375 MG tablet Take 1 tablet (375 mg total) by mouth 2 (two) times daily. 10/06/21  Yes Abeera Flannery, Noberto Retort, PA-C    Family History Family History  Problem Relation Age of Onset   Kidney disease Mother        was on dialysis, rec'd a new kidney   Diabetes Mother    Hypertension Mother    Cancer Father        pancreatic   Pancreatic cancer Father    Diabetes Maternal Grandmother    Hypertension Maternal Grandmother    Other Neg Hx     Social History Social History   Tobacco Use   Smoking status: Former    Packs/day: 0.50    Years: 2.00    Total pack years: 1.00    Types: Cigarettes    Quit date: 03/18/2014    Years since quitting: 7.5  Smokeless tobacco: Never  Vaping Use   Vaping Use: Former  Substance Use Topics   Alcohol use: Not Currently    Comment: 1 mixed drink daily. Not since confirmed pregnancy   Drug use: Not Currently    Types: Marijuana    Comment: not since + preg     Allergies   Hydrocodone   Review of Systems Review of Systems  Constitutional:  Positive for activity change. Negative for appetite change, fatigue and fever.  Gastrointestinal:  Positive for abdominal pain. Negative for diarrhea, nausea and vomiting.  Genitourinary:  Positive for vaginal discharge. Negative for dysuria, frequency, pelvic pain, urgency, vaginal bleeding and vaginal pain.  Musculoskeletal:  Positive for back pain. Negative for arthralgias and myalgias.     Physical Exam Triage Vital Signs ED Triage Vitals  Enc Vitals Group     BP 10/06/21 1402 106/75     Pulse Rate 10/06/21 1402 81     Resp 10/06/21 1402 18     Temp 10/06/21 1402 98.2 F (36.8 C)     Temp Source 10/06/21 1402 Oral     SpO2 10/06/21 1402 99 %     Weight --       Height --      Head Circumference --      Peak Flow --      Pain Score 10/06/21 1400 10     Pain Loc --      Pain Edu? --      Excl. in GC? --    No data found.  Updated Vital Signs BP 106/75 (BP Location: Left Arm)   Pulse 81   Temp 98.2 F (36.8 C) (Oral)   Resp 18   LMP 09/29/2021   SpO2 99%   Visual Acuity Right Eye Distance:   Left Eye Distance:   Bilateral Distance:    Right Eye Near:   Left Eye Near:    Bilateral Near:     Physical Exam Vitals reviewed.  Constitutional:      General: She is awake. She is not in acute distress.    Appearance: Normal appearance. She is well-developed. She is not ill-appearing.     Comments: Very pleasant female presented age in no acute distress sitting comfortably in exam room  HENT:     Head: Normocephalic and atraumatic.  Cardiovascular:     Rate and Rhythm: Normal rate and regular rhythm.     Heart sounds: Normal heart sounds, S1 normal and S2 normal. No murmur heard. Pulmonary:     Effort: Pulmonary effort is normal.     Breath sounds: Normal breath sounds. No wheezing, rhonchi or rales.     Comments: Clear to auscultation bilaterally Abdominal:     General: Bowel sounds are normal.     Palpations: Abdomen is soft.     Tenderness: There is abdominal tenderness in the suprapubic area and left lower quadrant. There is no right CVA tenderness, left CVA tenderness, guarding or rebound.     Comments: Mild tenderness palpation in the left lower quadrant and suprapubic region.  No evidence of acute abdomen on physical exam.  Genitourinary:    Labia:        Right: No rash or tenderness.        Left: No rash or tenderness.      Vagina: Vaginal discharge present.     Cervix: No cervical motion tenderness.  Psychiatric:        Behavior: Behavior is cooperative.  UC Treatments / Results  Labs (all labs ordered are listed, but only abnormal results are displayed) Labs Reviewed  POC URINE PREG, ED  POCT URINALYSIS  DIPSTICK, ED / UC  CERVICOVAGINAL ANCILLARY ONLY    EKG   Radiology No results found.  Procedures Procedures (including critical care time)  Medications Ordered in UC Medications - No data to display  Initial Impression / Assessment and Plan / UC Course  I have reviewed the triage vital signs and the nursing notes.  Pertinent labs & imaging results that were available during my care of the patient were reviewed by me and considered in my medical decision making (see chart for details).     Vital signs and physical exam reassuring today; no indication for emergent evaluation or imaging.  UA was slightly concentrated but with no evidence of infection.  Urine pregnancy was negative.  Pelvic exam was performed without significant cervical motion tenderness making PID less likely.  Discussed that we do not have any imaging capabilities in urgent care and that she should follow-up closely with her OB/GYN.  If she has any increasing or changing symptoms she needs to go to the emergency room for further evaluation and management.  We will treat pain with Naprosyn twice daily and she was instructed not to take NSAIDs with this medication.  STI swab was collected and we will contact her if we need to arrange any additional treatment.  She is to rest and drink plenty of fluid.  She was provided a work excuse note.  Discussed the importance of close follow-up with OB/GYN.  Discussed alarm symptoms that warrant going to the ER.  Final Clinical Impressions(s) / UC Diagnoses   Final diagnoses:  LLQ abdominal pain  Acute left-sided low back pain without sciatica     Discharge Instructions      We will contact you if your swab is positive and we need to arrange any treatment.  Use Naprosyn twice daily for pain.  Do not take NSAIDs including aspirin, ibuprofen/Advil, naproxen/Aleve, meloxicam/Mobic with this medication.  You can use Tylenol for breakthrough pain.  You should follow-up with your  OB/GYN as soon as possible.  Please call them immediately to schedule an appointment.  If you have any worsening symptoms including worsening pain, fever, nausea, vomiting you need to go to the emergency room immediately.     ED Prescriptions     Medication Sig Dispense Auth. Provider   naproxen (NAPROSYN) 375 MG tablet Take 1 tablet (375 mg total) by mouth 2 (two) times daily. 20 tablet Lorna Strother, Noberto Retort, PA-C      PDMP not reviewed this encounter.   Jeani Hawking, PA-C 10/06/21 1527

## 2021-10-06 NOTE — ED Triage Notes (Signed)
Patient complains of left sided pain since yesterday and now back pain. Took IBU yesterday with little relief. Took Mobic this morning for back pain.

## 2021-10-06 NOTE — Discharge Instructions (Signed)
We will contact you if your swab is positive and we need to arrange any treatment.  Use Naprosyn twice daily for pain.  Do not take NSAIDs including aspirin, ibuprofen/Advil, naproxen/Aleve, meloxicam/Mobic with this medication.  You can use Tylenol for breakthrough pain.  You should follow-up with your OB/GYN as soon as possible.  Please call them immediately to schedule an appointment.  If you have any worsening symptoms including worsening pain, fever, nausea, vomiting you need to go to the emergency room immediately.

## 2021-10-07 ENCOUNTER — Telehealth (HOSPITAL_COMMUNITY): Payer: Self-pay | Admitting: Emergency Medicine

## 2021-10-07 LAB — CERVICOVAGINAL ANCILLARY ONLY
Bacterial Vaginitis (gardnerella): NEGATIVE
Candida Glabrata: NEGATIVE
Candida Vaginitis: NEGATIVE
Chlamydia: NEGATIVE
Comment: NEGATIVE
Comment: NEGATIVE
Comment: NEGATIVE
Comment: NEGATIVE
Comment: NEGATIVE
Comment: NORMAL
Neisseria Gonorrhea: POSITIVE — AB
Trichomonas: NEGATIVE

## 2021-10-07 NOTE — Telephone Encounter (Signed)
Per protocol, patient will need treatment with IM Rocephin 500mg for positive Gonorrhea.   Attempted to reach patient x 1, [facility] notified 

## 2021-10-08 ENCOUNTER — Ambulatory Visit (HOSPITAL_COMMUNITY)
Admission: RE | Admit: 2021-10-08 | Discharge: 2021-10-08 | Disposition: A | Discharge status: Home / Self Care | Payer: Medicaid Other | Source: Ambulatory Visit | Attending: Emergency Medicine | Admitting: Emergency Medicine

## 2021-10-08 DIAGNOSIS — A549 Gonococcal infection, unspecified: Secondary | ICD-10-CM

## 2021-10-08 MED ORDER — CEFTRIAXONE SODIUM 500 MG IJ SOLR
INTRAMUSCULAR | Status: AC
  Filled 2021-10-08: qty 500

## 2021-10-08 MED ORDER — CEFTRIAXONE SODIUM 500 MG IJ SOLR
500.0000 mg | Freq: Once | INTRAMUSCULAR | Status: AC
  Administered 2021-10-08: 500 mg via INTRAMUSCULAR

## 2021-10-08 NOTE — ED Triage Notes (Signed)
Pt presents for STD treatment. IM rocephin given per protocol.

## 2021-10-13 ENCOUNTER — Ambulatory Visit: Payer: Medicaid Other | Admitting: Sports Medicine

## 2021-10-14 ENCOUNTER — Ambulatory Visit (INDEPENDENT_AMBULATORY_CARE_PROVIDER_SITE_OTHER): Payer: Medicaid Other | Admitting: Family Medicine

## 2021-10-14 VITALS — BP 112/75 | Ht 68.0 in | Wt 215.0 lb

## 2021-10-14 DIAGNOSIS — M545 Low back pain, unspecified: Secondary | ICD-10-CM | POA: Diagnosis present

## 2021-10-14 MED ORDER — TIZANIDINE HCL 4 MG PO TABS: 4.0000 mg | ORAL_TABLET | Freq: Three times a day (TID) | ORAL | 1 refills | Status: DC | PRN

## 2021-10-14 NOTE — Patient Instructions (Signed)
You have a lumbar strain. Ok to take tylenol for baseline pain relief (1-2 extra strength tabs 3x/day) Take aleve or ibuprofen if needed with food for pain and inflammation. Take the muscle relaxant as needed for muscle spasms (no driving on this medicine if it makes you sleepy). Stay as active as possible. Do home exercises and stretches as directed - hold each for 20-30 seconds and do each one three times. Consider physical therapy. Strengthening of low back muscles, abdominal musculature are key for long term pain relief. If not improving, will consider further imaging (MRI). Follow up with me as needed.

## 2021-10-14 NOTE — Progress Notes (Signed)
PCP: Pcp, No  Subjective:   HPI: Patient is a 31 y.o. female here for low back pain.  Patient denies known injury or trauma. She has now had a couple episodes of low back pain. No radiation. No numbness or tingling. No bowel or bladder dysfunction. She had benefit from a muscle relaxant (tizanidine). Current pain resolved 2 days ago but she wanted to keep the appointment.  Past Medical History:  Diagnosis Date   Gestational diabetes    Hx of gonorrhea    Keloid    Left Upper Lateral Earlobe   Malignant hyperthermia    Pregnant    hx   Trichomonas contact, treated     Current Outpatient Medications on File Prior to Visit  Medication Sig Dispense Refill   naproxen (NAPROSYN) 375 MG tablet Take 1 tablet (375 mg total) by mouth 2 (two) times daily. 20 tablet 0   No current facility-administered medications on file prior to visit.    Past Surgical History:  Procedure Laterality Date   EAR CYST EXCISION Left 04/05/2014   Procedure: EXCISION EAR CYST;  Surgeon: Melvenia Beam, MD;  Location: [facility];  Service: ENT;  Laterality: Left;  Excision of keloid   EXTERNAL EAR SURGERY Left 2012    Allergies  Allergen Reactions   Hydrocodone Itching    BP 112/75   Ht 5\' 8"  (1.727 m)   Wt 215 lb (97.5 kg)   LMP 09/29/2021   BMI 32.69 kg/m       No data to display              No data to display              Objective:  Physical Exam:  Gen: NAD, comfortable in exam room  Back: No gross deformity, scoliosis. No paraspinal TTP.  No midline or bony TTP. FROM. Strength LEs 5/5 all muscle groups.   2+ MSRs in patellar and achilles tendons, equal bilaterally. Negative SLRs. Sensation intact to light touch bilaterally.   Assessment & Plan:  1. Low back pain - 2/2 lumbar strain.  Home exercises reviewed.  Tizanidine prescribed.  Tylenol, aleve or ibuprofen if needed.  F/u prn.

## 2021-10-27 ENCOUNTER — Ambulatory Visit (INDEPENDENT_AMBULATORY_CARE_PROVIDER_SITE_OTHER): Payer: Medicaid Other | Admitting: Obstetrics and Gynecology

## 2021-10-27 ENCOUNTER — Other Ambulatory Visit (HOSPITAL_COMMUNITY)
Admission: RE | Admit: 2021-10-27 | Discharge: 2021-10-27 | Disposition: A | Discharge status: Home / Self Care | Payer: Medicaid Other | Source: Ambulatory Visit | Attending: Obstetrics and Gynecology | Admitting: Obstetrics and Gynecology

## 2021-10-27 VITALS — BP 113/73 | HR 76 | Wt 211.0 lb

## 2021-10-27 DIAGNOSIS — N898 Other specified noninflammatory disorders of vagina: Secondary | ICD-10-CM | POA: Insufficient documentation

## 2021-10-27 NOTE — Progress Notes (Signed)
Pt states she was seen at Urgent care for pelvic pain, was tested and treated for [facility]. Pt states pain is a little better, now having irritation- ?yeast.

## 2021-10-27 NOTE — Progress Notes (Signed)
  Cc: ED follow up Subjective:    Patient ID: Karen Mcmahon, female    DOB: 07/08/1990, 31 y.o.   MRN: [id number]  HPI Pt seen for follow up from urgent care eval of pelvic pain. The pain was crampy and constant with sudden onset, but has currently resolved. Pt had  positive gonorrhea diagnosed and she was treated about 2 weeks ago.  Per pt her spouse was treated as well.  Pt continues to have some mild vaginal irritation, but denies current abdominal pain.   Review of Systems     Objective:   Physical Exam Vitals:   10/27/21 1614  BP: 113/73  Pulse: 76   SVE: vaginal swab for yeast/BV taken without incident Bimanual exam: no CMT, no adnexal pain or fullness bilaterally.      Assessment & Plan:   1. Vaginal discharge Swab pending - Cervicovaginal ancillary only( [place])  F/u in 1 month for TOC Pt offered pelvic ultrasound, but declined at this time  Warden Fillers, MD Faculty Attending, Center for [facility]

## 2021-10-29 LAB — CERVICOVAGINAL ANCILLARY ONLY
Bacterial Vaginitis (gardnerella): NEGATIVE
Candida Glabrata: NEGATIVE
Candida Vaginitis: POSITIVE — AB
Comment: NEGATIVE
Comment: NEGATIVE
Comment: NEGATIVE
Comment: NEGATIVE
Trichomonas: NEGATIVE

## 2021-10-30 ENCOUNTER — Other Ambulatory Visit: Payer: Self-pay

## 2021-10-30 ENCOUNTER — Encounter: Payer: Self-pay | Admitting: Obstetrics and Gynecology

## 2021-10-30 MED ORDER — FLUCONAZOLE 150 MG PO TABS: 150.0000 mg | ORAL_TABLET | Freq: Once | ORAL | 0 refills | Status: AC

## 2021-11-30 ENCOUNTER — Ambulatory Visit (INDEPENDENT_AMBULATORY_CARE_PROVIDER_SITE_OTHER): Payer: Medicaid Other | Admitting: General Practice

## 2021-11-30 ENCOUNTER — Other Ambulatory Visit (HOSPITAL_COMMUNITY)
Admission: RE | Admit: 2021-11-30 | Discharge: 2021-11-30 | Disposition: A | Discharge status: Home / Self Care | Payer: Medicaid Other | Source: Ambulatory Visit | Attending: Obstetrics and Gynecology | Admitting: Obstetrics and Gynecology

## 2021-11-30 DIAGNOSIS — N898 Other specified noninflammatory disorders of vagina: Secondary | ICD-10-CM

## 2021-11-30 NOTE — Progress Notes (Signed)
SUBJECTIVE:  31 y.o. female presents for TOV. Pt complains of odorless and yellow vaginal discharge for several week(s). Denies abnormal vaginal bleeding or significant pelvic pain or fever. No UTI symptoms. Denies history of known exposure to STD.  No LMP recorded.  OBJECTIVE:  She appears well, afebrile. Urine dipstick: not done.  ASSESSMENT:  Vaginal Discharge  Vaginal Odor   PLAN:  GC, chlamydia, trichomonas, BVAG, CVAG probe sent to lab. Treatment: To be determined once lab results are received ROV prn if symptoms persist or worsen.

## 2021-12-02 LAB — CERVICOVAGINAL ANCILLARY ONLY
Bacterial Vaginitis (gardnerella): NEGATIVE
Candida Glabrata: NEGATIVE
Candida Vaginitis: NEGATIVE
Chlamydia: NEGATIVE
Comment: NEGATIVE
Comment: NEGATIVE
Comment: NEGATIVE
Comment: NEGATIVE
Comment: NEGATIVE
Comment: NORMAL
Neisseria Gonorrhea: NEGATIVE
Trichomonas: NEGATIVE

## 2022-03-18 ENCOUNTER — Ambulatory Visit (INDEPENDENT_AMBULATORY_CARE_PROVIDER_SITE_OTHER): Payer: Medicaid Other | Admitting: Obstetrics

## 2022-03-18 ENCOUNTER — Other Ambulatory Visit (HOSPITAL_COMMUNITY)
Admission: RE | Admit: 2022-03-18 | Discharge: 2022-03-18 | Disposition: A | Discharge status: Home / Self Care | Payer: Medicaid Other | Source: Ambulatory Visit | Attending: Obstetrics | Admitting: Obstetrics

## 2022-03-18 ENCOUNTER — Encounter: Payer: Self-pay | Admitting: Obstetrics

## 2022-03-18 VITALS — BP 111/71 | HR 91 | Ht 68.0 in | Wt 205.3 lb

## 2022-03-18 DIAGNOSIS — N946 Dysmenorrhea, unspecified: Secondary | ICD-10-CM | POA: Diagnosis not present

## 2022-03-18 DIAGNOSIS — E669 Obesity, unspecified: Secondary | ICD-10-CM

## 2022-03-18 DIAGNOSIS — N898 Other specified noninflammatory disorders of vagina: Secondary | ICD-10-CM | POA: Insufficient documentation

## 2022-03-18 MED ORDER — FLUCONAZOLE 200 MG PO TABS: 200.0000 mg | ORAL_TABLET | ORAL | 2 refills | Status: DC

## 2022-03-18 MED ORDER — IBUPROFEN 800 MG PO TABS: 800.0000 mg | ORAL_TABLET | Freq: Three times a day (TID) | ORAL | 5 refills | Status: DC | PRN

## 2022-03-18 NOTE — Progress Notes (Signed)
Pt presents for vaginal itching with yellow discharge of 3 days. Pt had refill of Metronidazole and started it yesterday due to itching.

## 2022-03-18 NOTE — Progress Notes (Signed)
Patient ID: Karen Mcmahon, female   DOB: 03-23-1990, 32 y.o.   MRN: [id number]  Chief Complaint  Patient presents with   Vaginal Discharge    HPI Karen Mcmahon is a 32 y.o. female.  Complains of vaginal discharge and itching.  Denies vaginal odor. HPI  Past Medical History:  Diagnosis Date   Gestational diabetes    Hx of gonorrhea    Keloid    Left Upper Lateral Earlobe   Malignant hyperthermia    Pregnant    hx   Trichomonas contact, treated     Past Surgical History:  Procedure Laterality Date   EAR CYST EXCISION Left 04/05/2014   Procedure: EXCISION EAR CYST;  Surgeon: Ruby Cola, MD;  Location: [place];  Service: ENT;  Laterality: Left;  Excision of keloid   EXTERNAL EAR SURGERY Left 2012    Family History  Problem Relation Age of Onset   Kidney disease Mother        was on dialysis, rec'd a new kidney   Diabetes Mother    Hypertension Mother    Cancer Father        pancreatic   Pancreatic cancer Father    Diabetes Maternal Grandmother    Hypertension Maternal Grandmother    Other Neg Hx     Social History Social History   Tobacco Use   Smoking status: Former    Packs/day: 0.50    Years: 2.00    Total pack years: 1.00    Types: Cigarettes    Quit date: 03/18/2014    Years since quitting: 8.0   Smokeless tobacco: Never  Vaping Use   Vaping Use: Former  Substance Use Topics   Alcohol use: Not Currently    Comment: 1 mixed drink daily. Not since confirmed pregnancy   Drug use: Not Currently    Types: Marijuana    Comment: not since + preg    Allergies  Allergen Reactions   Hydrocodone Itching    Current Outpatient Medications  Medication Sig Dispense Refill   fluconazole (DIFLUCAN) 200 MG tablet Take 1 tablet (200 mg total) by mouth every 3 (three) days. 3 tablet 2   ibuprofen (ADVIL) 800 MG tablet Take 1 tablet (800 mg total) by mouth every 8 (eight) hours as needed. 30 tablet 5   naproxen (NAPROSYN) 375 MG tablet Take 1 tablet (375 mg total)  by mouth 2 (two) times daily. (Patient not taking: Reported on 10/27/2021) 20 tablet 0   tiZANidine (ZANAFLEX) 4 MG tablet Take 1 tablet (4 mg total) by mouth every 8 (eight) hours as needed. (Patient not taking: Reported on 10/27/2021) 30 tablet 1   No current facility-administered medications for this visit.    Review of Systems Review of Systems Constitutional: negative for fatigue and weight loss Respiratory: negative for cough and wheezing Cardiovascular: negative for chest pain, fatigue and palpitations Gastrointestinal: negative for abdominal pain and change in bowel habits Genitourinary: positive for vaginal discharge and irritation Integument/breast: negative for nipple discharge Musculoskeletal:negative for myalgias Neurological: negative for gait problems and tremors Behavioral/Psych: negative for abusive relationship, depression Endocrine: negative for temperature intolerance      Blood pressure 111/71, pulse 91, height 5\' 8"  (1.727 m), weight 205 lb 4.8 oz (93.1 kg), last menstrual period 03/11/2022, not currently breastfeeding.  Physical Exam Physical Exam General:   Alert and no distress  Skin:   no rash or abnormalities  Lungs:   clear to auscultation bilaterally  Heart:   regular rate and  rhythm, S1, S2 normal, no murmur, click, rub or gallop  Breasts:   normal without suspicious masses, skin or nipple changes or axillary nodes  Abdomen:  normal findings: no organomegaly, soft, non-tender and no hernia  Pelvis:  External genitalia: normal general appearance Urinary system: urethral meatus normal and bladder without fullness, nontender Vaginal: normal without tenderness, induration or masses Cervix: normal appearance Adnexa: normal bimanual exam Uterus: anteverted and non-tender, normal size    I have spent a total of 20 minutes of face to face time, excluding clinical staff time, reviewing notes and preparing to see patient, ordering tests and/or medications, and  counseling the patient.   Data Reviewed Wet Prep  Assessment     1. Vaginal discharge Rx: - Cervicovaginal ancillary only( [place])  2. Vaginal irritation Rx: - fluconazole (DIFLUCAN) 200 MG tablet; Take 1 tablet (200 mg total) by mouth every 3 (three) days.  Dispense: 3 tablet; Refill: 2  3. Dysmenorrhea Rx: - ibuprofen (ADVIL) 800 MG tablet; Take 1 tablet (800 mg total) by mouth every 8 (eight) hours as needed.  Dispense: 30 tablet; Refill: 5  4. Obesity (BMI 30.0-34.9) - weight reduction recommended     Plan   Follow up in 3 months for Annual / Pap  Meds ordered this encounter  Medications   fluconazole (DIFLUCAN) 200 MG tablet    Sig: Take 1 tablet (200 mg total) by mouth every 3 (three) days.    Dispense:  3 tablet    Refill:  2   ibuprofen (ADVIL) 800 MG tablet    Sig: Take 1 tablet (800 mg total) by mouth every 8 (eight) hours as needed.    Dispense:  30 tablet    Refill:  5     Jamoni Hewes A. Jodi Mourning MD 03/18/2022

## 2022-03-19 LAB — CERVICOVAGINAL ANCILLARY ONLY
Bacterial Vaginitis (gardnerella): NEGATIVE
Candida Glabrata: NEGATIVE
Candida Vaginitis: POSITIVE — AB
Chlamydia: NEGATIVE
Comment: NEGATIVE
Comment: NEGATIVE
Comment: NEGATIVE
Comment: NEGATIVE
Comment: NEGATIVE
Comment: NORMAL
Neisseria Gonorrhea: NEGATIVE
Trichomonas: NEGATIVE

## 2022-03-20 ENCOUNTER — Other Ambulatory Visit: Payer: Self-pay | Admitting: Obstetrics

## 2022-06-14 ENCOUNTER — Encounter: Payer: Self-pay | Admitting: *Deleted

## 2022-07-08 ENCOUNTER — Ambulatory Visit (INDEPENDENT_AMBULATORY_CARE_PROVIDER_SITE_OTHER): Payer: Medicaid Other | Admitting: Dermatology

## 2022-07-08 ENCOUNTER — Encounter: Payer: Self-pay | Admitting: Dermatology

## 2022-07-08 ENCOUNTER — Other Ambulatory Visit: Payer: Self-pay | Admitting: Dermatology

## 2022-07-08 ENCOUNTER — Other Ambulatory Visit: Payer: Self-pay

## 2022-07-08 DIAGNOSIS — L821 Other seborrheic keratosis: Secondary | ICD-10-CM | POA: Diagnosis not present

## 2022-07-08 DIAGNOSIS — D489 Neoplasm of uncertain behavior, unspecified: Secondary | ICD-10-CM

## 2022-07-08 DIAGNOSIS — L816 Other disorders of diminished melanin formation: Secondary | ICD-10-CM | POA: Diagnosis not present

## 2022-07-08 DIAGNOSIS — L700 Acne vulgaris: Secondary | ICD-10-CM | POA: Diagnosis not present

## 2022-07-08 MED ORDER — CLINDAMYCIN PHOSPHATE 1 % EX SWAB: 1.0000 | Freq: Every morning | CUTANEOUS | 2 refills | Status: DC

## 2022-07-08 MED ORDER — TRETINOIN 0.025 % EX CREA: TOPICAL_CREAM | Freq: Every day | CUTANEOUS | 0 refills | Status: DC

## 2022-07-08 MED ORDER — CLINDAMYCIN PHOSPHATE 1 % EX SWAB: 1.0000 "application " | CUTANEOUS | 2 refills | Status: DC

## 2022-07-08 NOTE — Patient Instructions (Signed)
Daily Skincare Regimen: Patient Handout  Morning Routine:  Cleanse: Start your day by washing your face with a gentle cleanser. Choose a product suitable for your skin type, such as CeraVe, Neutrogena, Cetaphil, or LaRoche-Posay. Gently massage the cleanser onto damp skin, then rinse thoroughly with lukewarm water and pat dry with a clean towel.  Apply Medication: Apply prescription medication Clindamycin Swabs   Moisturize: Finish your morning routine by applying a moisturizer to your face and neck. Opt for a moisturizer that has SPF included, is suitable for your skin type and provides hydration without clogging pores. Consider brands like CeraVe, Neutrogena, Cetaphil, or LaRoche-Posay for effective hydration and skin barrier support.  Evening Routine:  Cleanse: Before bed, cleanse your face again with a gentle cleanser to remove makeup, dirt, and impurities accumulated throughout the day. Use the same cleanser as in the morning and follow the same steps for cleansing.  Apply Medication: Ensure that your skin is completely dry before applying any topical treatments to maximize their efficacy.  Apply pea size amount of prescription medication Tretinoin    Moisturize: After applying any medications, moisturize your skin to seal in hydration and support skin barrier function. Choose a moisturizer suitable for nighttime use that helps replenish moisture overnight. Look for products from trusted brands like CeraVe, Neutrogena, Cetaphil, or LaRoche-Posay for optimal results.   Additional Tips:  Sun Protection: During the daytime, it is essential to apply a broad-spectrum sunscreen with an SPF of 30 or higher to protect your skin from harmful UV rays. Apply sunscreen as the last step of your morning skincare routine and reapply throughout the day as needed, especially if you will be spending time outdoors.  Hydration: Drink plenty of  water throughout the day to keep your skin hydrated from within.  Healthy Lifestyle: Maintain a balanced diet, get regular exercise, manage stress, and prioritize adequate sleep to support overall skin health.   Following a consistent daily skincare regimen can help promote healthy, radiant skin and minimize the risk of common skin issues. Be patient and consistent with your routine, and remember to listen to your skin's needs  Due to recent changes in healthcare laws, you may see results of your pathology and/or laboratory studies on MyChart before the doctors have had a chance to review them. We understand that in some cases there may be results that are confusing or concerning to you. Please understand that not all results are received at the same time and often the doctors may need to interpret multiple results in order to provide you with the best plan of care or course of treatment. Therefore, we ask that you please give [place] 2 business days to thoroughly review all your results before contacting the office for clarification. Should we see a critical lab result, you will be contacted sooner.   If You Need Anything After Your Visit  If you have any questions or concerns for your doctor, please call our main line at [phone] If no one answers, please leave a voicemail as directed and we will return your call as soon as possible. Messages left after 4 pm will be answered the following business day.   You may also send [place] a message via MyChart. We typically respond to MyChart messages within 1-2 business days.  For prescription refills, please ask your pharmacy to contact our office. Our fax number is [phone].  If you have an urgent issue when the clinic is closed that cannot wait until the next business day,  you can page your doctor at the number below.    Please note that while we do our best to be available for urgent issues outside of office hours, we are not available 24/7.   If  you have an urgent issue and are unable to reach [place], you may choose to seek medical care at your doctor's office, retail clinic, urgent care center, or emergency room.  If you have a medical emergency, please immediately call 911 or go to the emergency department. In the event of inclement weather, please call our main line at [phone] for an update on the status of any delays or closures.  Dermatology Medication Tips: Please keep the boxes that topical medications come in in order to help keep track of the instructions about where and how to use these. Pharmacies typically print the medication instructions only on the boxes and not directly on the medication tubes.   If your medication is too expensive, please contact our office at [phone] or send [place] a message through MyChart.   We are unable to tell what your co-pay for medications will be in advance as this is different depending on your insurance coverage. However, we may be able to find a substitute medication at lower cost or fill out paperwork to get insurance to cover a needed medication.   If a prior authorization is required to get your medication covered by your insurance company, please allow [place] 1-2 business days to complete this process.  Drug prices often vary depending on where the prescription is filled and some pharmacies may offer cheaper prices.  The website www.goodrx.com contains coupons for medications through different pharmacies. The prices here do not account for what the cost may be with help from insurance (it may be cheaper with your insurance), but the website can give you the price if you did not use any insurance.  - You can print the associated coupon and take it with your prescription to the pharmacy.  - You may also stop by our office during regular business hours and pick up a GoodRx coupon card.  - If you need your prescription sent electronically to a different pharmacy, notify our office through [facility] or by phone at [phone]

## 2022-07-08 NOTE — Progress Notes (Signed)
   New Patient Visit   Subjective  Karen Mcmahon is a 32 y.o. female who presents for the following: Light patches and acne   The acne has been going on since highschool. Has previously seen a dermatologist but can not remember the treatment. She is currently using Clearasil medicated facial cleanser and is not using moisturizer.   Light spots on legs and arms. Has been a problem for about a year. Has not tried any products.   The following portions of the chart were reviewed this encounter and updated as appropriate: medications, allergies, medical history  Review of Systems:  No other skin or systemic complaints except as noted in HPI or Assessment and Plan.  Objective  Well appearing patient in no apparent distress; mood and affect are within normal limits.  A focused examination was performed of the following areas: Face, arms and legs  Relevant exam findings are noted in the Assessment and Plan.  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 spots   Treatment Plan: -Reassurance  -No treatment necessary   Neoplasm of uncertain behavior Left Upper Arm - Anterior  Skin / nail biopsy Type of biopsy: tangential   Informed consent: discussed and consent obtained   Timeout: patient name, date of birth, surgical site, and procedure verified   Procedure prep:  Patient was prepped and draped in usual sterile fashion Prep type:  Isopropyl alcohol Anesthesia: the lesion was anesthetized in a standard fashion   Anesthetic:  1% lidocaine w/ epinephrine 1-100,000 buffered w/ 8.4% NaHCO3 Instrument used: DermaBlade   Hemostasis achieved with: aluminum chloride   Outcome: patient tolerated procedure well   Post-procedure details: sterile dressing applied and wound care instructions given   Dressing type: petrolatum gauze and bandage    Specimen 1 - Surgical  pathology Differential Diagnosis: Epidermal nevus vs dysplastic nevus  Check Margins: No  ACNE VULGARIS Exam: Open comedones and inflammatory papules  Flared  Treatment Plan:  -Clindamycin Swabs to use in the morning after cleansing -Tretinoin 0.025 to use a pea size amount at night after cleansing three nights a week -Explained to patient that it will take about 3 months to see a 50-80% improvement.  Sometimes purging can occur however this is rare, if this occurs she should continue applying the creams as directed and she can call the office to start oral doxycycline to help her through the flare.  Pt denies being pregnant, nursing or trying to conceive.  She is aware she must stop all acne products if she become pregnant.   Return in about 3 months (around 10/08/2022) for Acne F/U.    Documentation: I have reviewed the above documentation for accuracy and completeness, and I agree with the above.  Langston Reusing, MD  I, Germaine Pomfret, CMA, am acting as scribe for Langston Reusing, MD.

## 2022-07-19 ENCOUNTER — Telehealth: Payer: Self-pay | Admitting: Dermatology

## 2022-07-19 NOTE — Telephone Encounter (Signed)
Called pharmacy. They had to order Clindamycin pledgets. Rx will be ready within the hour. She has already picked up the Tretinoin.  Called patient and advised.

## 2022-07-19 NOTE — Telephone Encounter (Signed)
Patient is calling in regards to her medication, she states that she recalls being prescribed two medications but she has only gotten one of the two which is the cream but the pills are still pending. Requesting call back [phone]

## 2022-07-19 NOTE — Telephone Encounter (Signed)
Please call and see which med was not sent.  Thanks

## 2022-07-29 ENCOUNTER — Ambulatory Visit (INDEPENDENT_AMBULATORY_CARE_PROVIDER_SITE_OTHER): Payer: Medicaid Other | Admitting: Medical

## 2022-07-29 ENCOUNTER — Encounter: Payer: Self-pay | Admitting: Medical

## 2022-07-29 ENCOUNTER — Encounter: Payer: Self-pay | Admitting: Dermatology

## 2022-07-29 ENCOUNTER — Other Ambulatory Visit (HOSPITAL_COMMUNITY)
Admission: RE | Admit: 2022-07-29 | Discharge: 2022-07-29 | Disposition: A | Discharge status: Home / Self Care | Payer: Medicaid Other | Source: Ambulatory Visit | Attending: Medical | Admitting: Medical

## 2022-07-29 VITALS — BP 117/76 | HR 83 | Ht 67.0 in | Wt 205.2 lb

## 2022-07-29 DIAGNOSIS — Z113 Encounter for screening for infections with a predominantly sexual mode of transmission: Secondary | ICD-10-CM | POA: Diagnosis not present

## 2022-07-29 DIAGNOSIS — N760 Acute vaginitis: Secondary | ICD-10-CM | POA: Diagnosis not present

## 2022-07-29 DIAGNOSIS — B9689 Other specified bacterial agents as the cause of diseases classified elsewhere: Secondary | ICD-10-CM | POA: Diagnosis not present

## 2022-07-29 NOTE — Progress Notes (Signed)
Pt presents for STD testing. Pt c/o vaginal odor and discharge for a few days. Declines self swab, requesting provider to swab. No other concerns.

## 2022-07-29 NOTE — Progress Notes (Signed)
History:  Ms. Karen Mcmahon is a 32 y.o. [id number] who presents to clinic today for STD testing. The patient states that she is sexually active with a new female partner and using condoms sometimes. She has noted a discharge with odor x 2 days. She denies itching, swelling, bleeding or fever. She has had some pelvic pain.   The following portions of the patient's history were reviewed and updated as appropriate: allergies, current medications, family history, past medical history, social history, past surgical history and problem list.  Review of Systems:  Review of Systems  Constitutional:  Negative for chills, fever and malaise/fatigue.  Gastrointestinal:  Positive for abdominal pain.  Genitourinary:        + discharge Neg - vaginal bleeding      Objective:  Physical Exam BP 117/76   Pulse 83   Ht 5\' 7"  (1.702 m)   Wt 205 lb 3.2 oz (93.1 kg)   BMI 32.14 kg/m  Physical Exam Vitals and nursing note reviewed. Exam conducted with a chaperone present.  Constitutional:      Appearance: Normal appearance. She is not ill-appearing.  Cardiovascular:     Rate and Rhythm: Normal rate.  Pulmonary:     Effort: Pulmonary effort is normal.  Abdominal:     General: Abdomen is flat. There is no distension.     Tenderness: There is no abdominal tenderness. There is no guarding.  Genitourinary:    General: Normal vulva.     Vagina: Vaginal discharge (moderate, white discharge with odor) present. No bleeding.     Cervix: Normal.     Uterus: Normal.      Adnexa:        Right: No mass or tenderness.         Left: No mass or tenderness.    Skin:    General: Skin is warm and dry.     Findings: No erythema.  Neurological:     Mental Status: She is alert and oriented to person, place, and time.  Psychiatric:        Mood and Affect: Mood normal.       Labs and Imaging Results for orders placed or performed in visit on 07/29/22 (from the past 24 hour(s))  HIV Antibody (routine testing w  rflx)     Status: None   Collection Time: 07/29/22  4:59 PM  Result Value Ref Range   HIV Screen 4th Generation wRfx Non Reactive Non Reactive   Narrative   Performed at:  [address] [facility] [address] [place], [place]  [id number] Lab Director: Jolene Schimke MD, Phone:  [phone]  Hepatitis C Antibody     Status: None   Collection Time: 07/29/22  4:59 PM  Result Value Ref Range   Hep C Virus Ab Non Reactive Non Reactive   Narrative   Performed at:  [address] [facility] [address], [place], [place]  [id number] Lab Director: Jolene Schimke MD, Phone:  [phone]  Hepatitis B Surface AntiGEN     Status: None   Collection Time: 07/29/22  4:59 PM  Result Value Ref Range   Hepatitis B Surface Ag Negative Negative   Narrative   Performed at:  [address] [facility] [address] [place], [place]  [id number] Lab Director: Jolene Schimke MD, Phone:  [phone]  RPR     Status: None   Collection Time: 07/29/22  4:59 PM  Result Value Ref Range   RPR Ser Ql Non Reactive Non Reactive  Narrative   Performed at:  [address], [place], [place]  [id number] Lab Director: Jolene Schimke MD, Phone:  [phone]     Health Maintenance Due  Topic Date Due   COVID-19 Vaccine (1) Never done   DTaP/Tdap/Td (2 - Td or Tdap) 05/29/2021    Labs, imaging and previous visits in Epic reviewed  Assessment & Plan:  1. Screen for STD (sexually transmitted disease) - Cervicovaginal ancillary only( [place]) - POCT urine pregnancy - HIV Antibody (routine testing w rflx) - Hepatitis C Antibody - Hepatitis B Surface AntiGEN - RPR - Patient will receive results via MyChart  - Return to [facility] PRN    Marny Lowenstein, PA-C 07/30/2022 10:17 AM

## 2022-07-30 ENCOUNTER — Encounter: Payer: Self-pay | Admitting: Medical

## 2022-07-30 LAB — HEPATITIS B SURFACE ANTIGEN: Hepatitis B Surface Ag: NEGATIVE

## 2022-07-30 LAB — HIV ANTIBODY (ROUTINE TESTING W REFLEX): HIV Screen 4th Generation wRfx: NONREACTIVE

## 2022-07-30 LAB — HEPATITIS C ANTIBODY: Hep C Virus Ab: NONREACTIVE

## 2022-07-30 LAB — POCT URINE PREGNANCY: Preg Test, Ur: NEGATIVE

## 2022-07-30 LAB — RPR: RPR Ser Ql: NONREACTIVE

## 2022-08-02 LAB — CERVICOVAGINAL ANCILLARY ONLY
Bacterial Vaginitis (gardnerella): POSITIVE — AB
Candida Glabrata: NEGATIVE
Candida Vaginitis: NEGATIVE
Chlamydia: NEGATIVE
Comment: NEGATIVE
Comment: NEGATIVE
Comment: NEGATIVE
Comment: NEGATIVE
Comment: NEGATIVE
Comment: NORMAL
Neisseria Gonorrhea: NEGATIVE
Trichomonas: NEGATIVE

## 2022-08-03 ENCOUNTER — Other Ambulatory Visit: Payer: Self-pay | Admitting: Medical

## 2022-08-03 DIAGNOSIS — N898 Other specified noninflammatory disorders of vagina: Secondary | ICD-10-CM

## 2022-08-03 MED ORDER — METRONIDAZOLE 500 MG PO TABS
500.0000 mg | ORAL_TABLET | Freq: Two times a day (BID) | ORAL | 0 refills | Status: DC
Start: 2022-08-03 — End: 2022-12-10

## 2022-08-03 MED ORDER — FLUCONAZOLE 200 MG PO TABS
200.0000 mg | ORAL_TABLET | ORAL | 0 refills | Status: DC
Start: 2022-08-03 — End: 2022-12-10

## 2022-08-03 NOTE — Addendum Note (Signed)
Addended by: Marny Lowenstein on: 08/03/2022 07:45 AM   Modules accepted: Orders

## 2022-08-29 ENCOUNTER — Ambulatory Visit (HOSPITAL_COMMUNITY): Payer: Medicaid Other

## 2022-09-04 ENCOUNTER — Ambulatory Visit (HOSPITAL_COMMUNITY): Payer: Medicaid Other

## 2022-09-10 ENCOUNTER — Ambulatory Visit (HOSPITAL_COMMUNITY): Payer: Medicaid Other

## 2022-10-11 ENCOUNTER — Encounter: Payer: Self-pay | Admitting: Dermatology

## 2022-10-11 ENCOUNTER — Ambulatory Visit (INDEPENDENT_AMBULATORY_CARE_PROVIDER_SITE_OTHER): Payer: Medicaid Other | Admitting: Dermatology

## 2022-10-11 VITALS — BP 105/67

## 2022-10-11 DIAGNOSIS — L910 Hypertrophic scar: Secondary | ICD-10-CM | POA: Diagnosis not present

## 2022-10-11 DIAGNOSIS — L700 Acne vulgaris: Secondary | ICD-10-CM

## 2022-10-11 MED ORDER — TRETINOIN 0.05 % EX CREA: TOPICAL_CREAM | Freq: Every evening | CUTANEOUS | 3 refills | Status: DC

## 2022-10-11 MED ORDER — TRIAMCINOLONE ACETONIDE 10 MG/ML IJ SUSP
10.0000 mg | Freq: Once | INTRAMUSCULAR | Status: AC
Start: 2022-10-11 — End: 2022-10-11
  Administered 2022-10-11: 10 mg

## 2022-10-11 MED ORDER — SPIRONOLACTONE 100 MG PO TABS: 100.0000 mg | ORAL_TABLET | Freq: Every day | ORAL | 3 refills | Status: DC

## 2022-10-11 NOTE — Progress Notes (Unsigned)
   Follow-Up Visit   Subjective  Karen Mcmahon is a 32 y.o. 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 Treatment is with Clindamycin swabs in the morning and Tretinoin 0.025% 3 nights weekly. She doesn't see a change in her symptoms.   The following portions of the chart were reviewed this encounter and updated as appropriate: medications, allergies, medical history  Review of Systems:  No other skin or systemic complaints except as noted in HPI or Assessment and Plan.  Objective  Well appearing patient in no apparent distress; mood and affect are within normal limits.       Areas Examined: Face, chest and back  Relevant exam findings are noted in the Assessment and Plan.   Assessment & Plan   Keloid Left Upper Arm - Anterior  Intralesional injection - Left Upper Arm - Anterior Procedure Note Intralesional Injection  Location: left upper arm  Informed Consent: Discussed risks (infection, pain, bleeding, bruising, thinning of the skin, loss of skin pigment, lack of resolution, and recurrence of lesion) and benefits of the procedure, as well as the alternatives. Informed consent was obtained. Preparation: The area was prepared a standard fashion.  Anesthesia:  Procedure Details: An intralesional injection was performed with Kenalog 10 mg/cc. 0.2 cc in total were injected. NDC #: 0272-5366-44 Exp: 04/2024  Total number of injections: 1  Plan: The patient was instructed on post-op care. Recommend OTC analgesia as needed for pain.   Related Medications triamcinolone acetonide (KENALOG) 10 MG/ML injection 10 mg    ACNE VULGARIS Exam: Open comedones and inflammatory papules on the face  Not at goal  Treatment Plan: Start Spironolactone 100mg  daily with plenty of water.  Retin A 0.05% cream 3 nights weekly then at bedtime when well tolerated Continue Clindamycin swabs every morning  KELOID Exam shows Firm pink/brown dermal  papule(s)/plaque(s).   Plan: The patient was instructed on post-op care. Recommend OTC analgesia as needed for pain.   Return in about 3 months (around 01/11/2023) for Acne Follow Up.  Jaclynn Guarneri, CMA, am acting as scribe for [employer], DO.     Documentation: I have reviewed the above documentation for accuracy and completeness, and I agree with the above.  Langston Reusing, DO

## 2022-10-11 NOTE — Patient Instructions (Signed)
Hello Karen Mcmahon,  Thank you for visiting [place] today. We appreciate your commitment to improving your health and addressing your acne concerns. Based on our discussion, here are the key instructions and changes to your treatment plan:  - Medications:   - Spironolactone: Start taking this medication at night to help control hormone-related acne. It is important to monitor for any rare side effects like lightheadedness or dizziness. If you experience these, please contact us immediately.   - Retin-A (Tretinoin) 0.05%: Increase from 0.025% to 0.05%. Use it three nights a week for the next two weeks. If well-tolerated, increase to Monday through Friday. Adjust based on skin response, especially during colder months.   - Continue using Clindamycin in the morning as currently prescribed.  - Skincare:   - Ensure you are using a moisturizer that suits your skin. Continue using either CeraVe or La Roche-Posay as discussed.  - Follow-Up:   - Please schedule a follow-up appointment to monitor your progress and make any necessary adjustments to your treatment plan.  - Prescriptions:   - Your prescription for Spironolactone will be sent to your regular pharmacy and is available as a generic.  We have provided these instructions to assist you in managing your treatment effectively. Should you have any questions or need further clarification, do not hesitate to contact our office.  Warm regards,  Dr. Langston Reusing, Dermatologist  Due to recent changes in healthcare laws, you may see results of your pathology and/or laboratory studies on MyChart before the doctors have had a chance to review them. We understand that in some cases there may be results that are confusing or concerning to you. Please understand that not all results are received at the same time and often the doctors may need to interpret multiple results in order to provide you with the best plan of care or course of treatment. Therefore, we ask that  you please give [place] 2 business days to thoroughly review all your results before contacting the office for clarification. Should we see a critical lab result, you will be contacted sooner.   If You Need Anything After Your Visit  If you have any questions or concerns for your doctor, please call our main line at [phone] If no one answers, please leave a voicemail as directed and we will return your call as soon as possible. Messages left after 4 pm will be answered the following business day.   You may also send [place] a message via MyChart. We typically respond to MyChart messages within 1-2 business days.  For prescription refills, please ask your pharmacy to contact our office. Our fax number is [phone].  If you have an urgent issue when the clinic is closed that cannot wait until the next business day, you can page your doctor at the number below.    Please note that while we do our best to be available for urgent issues outside of office hours, we are not available 24/7.   If you have an urgent issue and are unable to reach [place], you may choose to seek medical care at your doctor's office, retail clinic, urgent care center, or emergency room.  If you have a medical emergency, please immediately call 911 or go to the emergency department. In the event of inclement weather, please call our main line at [phone] for an update on the status of any delays or closures.  Dermatology Medication Tips: Please keep the boxes that topical medications come in in order to help  keep track of the instructions about where and how to use these. Pharmacies typically print the medication instructions only on the boxes and not directly on the medication tubes.   If your medication is too expensive, please contact our office at [phone] or send [place] a message through MyChart.   We are unable to tell what your co-pay for medications will be in advance as this is different depending on your insurance  coverage. However, we may be able to find a substitute medication at lower cost or fill out paperwork to get insurance to cover a needed medication.   If a prior authorization is required to get your medication covered by your insurance company, please allow [place] 1-2 business days to complete this process.  Drug prices often vary depending on where the prescription is filled and some pharmacies may offer cheaper prices.  The website www.goodrx.com contains coupons for medications through different pharmacies. The prices here do not account for what the cost may be with help from insurance (it may be cheaper with your insurance), but the website can give you the price if you did not use any insurance.  - You can print the associated coupon and take it with your prescription to the pharmacy.  - You may also stop by our office during regular business hours and pick up a GoodRx coupon card.  - If you need your prescription sent electronically to a different pharmacy, notify our office through [facility] or by phone at [phone]

## 2022-10-19 NOTE — Progress Notes (Unsigned)
Cardiology Office Note:   Date:  10/21/2022  NAME:  Karen Mcmahon    MRN: [id number] DOB:  01-08-1991   PCP:  Pcp, No  Cardiologist:  None  Electrophysiologist:  None   Referring MD: No ref. provider found   Chief Complaint  Patient presents with   Chest Pain    History of Present Illness:   Karen Mcmahon is a 32 y.o. female with a hx of gestational diabetes who is being seen today for the evaluation of chest pain.  She reports chest pain for years.  Symptoms are on and off.  She describes initially having sharp chest discomfort and evaluation in the emergency room.  It does improve ibuprofen.  Since 3 to 4 years ago she reports 3 days/week where she can have chest discomfort.  She describes pressure in her chest that can last hours.  She reports she works in a [employer] and can notice symptoms there.  Ibuprofen will help it.  She is concerned about her heart.  She does have a history of gestational diabetes.  No high blood pressure.  She reports heart disease in her grandfather.  She has personally never had a heart attack or stroke.  CV exam normal.  EKG is normal.  She does smoke marijuana daily.  We discussed backing off on this as this could explain some of her symptoms.  No shortness of breath.  Pain stays in her center of her chest.  Does not appear to be positional.  No association with food.  She reports stress does not worsen her symptoms.  Symptoms occur at work but she is very active at work.  She is single.  She has 2 children.  She does not appear to follow with a primary care regularly.  We discussed this will be a good idea.   Past Medical History: Past Medical History:  Diagnosis Date   Hx of gonorrhea    Keloid    Left Upper Lateral [place]   Malignant hyperthermia    Pregnant    hx   Trichomonas contact, treated     Past Surgical History: Past Surgical History:  Procedure Laterality Date   EAR CYST EXCISION Left 04/05/2014   Procedure: EXCISION EAR CYST;   Surgeon: Melvenia Beam, MD;  Location: [facility];  Service: ENT;  Laterality: Left;  Excision of keloid   EXTERNAL EAR SURGERY Left 2012    Current Medications: Current Meds  Medication Sig   cholecalciferol (VITAMIN D3) 25 MCG (1000 UNIT) tablet Take 1,000 Units by mouth daily.   ibuprofen (ADVIL) 800 MG tablet Take 1 tablet (800 mg total) by mouth every 8 (eight) hours as needed.   tretinoin (RETIN-A) 0.05 % cream Apply topically at bedtime. Apply a pea sized amount to entire face     Allergies:    Hydrocodone   Social History: Social History   Socioeconomic History   Marital status: Significant Other    Spouse name: Not on file   Number of children: 2   Years of education: Not on file   Highest education level: Not on file  Occupational History   Occupation: fork Patent examiner    Employer: [id number]   Occupation: [employer]  Tobacco Use   Smoking status: Former    Current packs/day: 0.00    Average packs/day: 0.5 packs/day for 2.0 years (1.0 ttl pk-yrs)    Types: Cigarettes    Start date: 03/18/2012    Quit date: 03/18/2014    Years  since quitting: 8.6   Smokeless tobacco: Never  Vaping Use   Vaping status: Former  Substance and Sexual Activity   Alcohol use: Not Currently    Comment: 1 mixed drink daily. Not since confirmed pregnancy   Drug use: Not Currently    Types: Marijuana    Comment: not since + preg   Sexual activity: Yes    Partners: Male    Birth control/protection: None  Other Topics Concern   Not on file  Social History Narrative   Not on file   Social Determinants of Health   Financial Resource Strain: Not on file  Food Insecurity: Not on file  Transportation Needs: Not on file  Physical Activity: Not on file  Stress: Not on file  Social Connections: Not on file     Family History: The patient's family history includes Cancer in her father; Diabetes in her maternal grandmother and mother; Hypertension in her maternal grandmother and mother;  Kidney disease in her mother; Pancreatic cancer in her father. There is no history of Other.  ROS:   All other ROS reviewed and negative. Pertinent positives noted in the HPI.     EKGs/Labs/Other Studies Reviewed:   The following studies were personally reviewed by me today:  EKG:  EKG is ordered today.    EKG Interpretation Date/Time:  Thursday October 21 2022 08:57:56 EDT Ventricular Rate:  78 PR Interval:  162 QRS Duration:  70 QT Interval:  358 QTC Calculation: 408 R Axis:   84  Text Interpretation: Normal sinus rhythm Low voltage QRS Confirmed by Lennie Odor [id number]) on 10/21/2022 9:00:04 AM   Recent Labs: No results found for requested labs within last 365 days.   Recent Lipid Panel No results found for: "CHOL", "TRIG", "HDL", "CHOLHDL", "VLDL", "LDLCALC", "LDLDIRECT"  Physical Exam:   VS:  BP 110/70 (BP Location: Left Arm, Patient Position: Sitting, Cuff Size: Normal)   Pulse 78   Ht 5\' 8"  (1.727 m)   Wt 204 lb 3.2 oz (92.6 kg)   SpO2 98%   Breastfeeding No   BMI 31.05 kg/m    Wt Readings from Last 3 Encounters:  10/21/22 204 lb 3.2 oz (92.6 kg)  07/29/22 205 lb 3.2 oz (93.1 kg)  03/18/22 205 lb 4.8 oz (93.1 kg)    General: Well nourished, well developed, in no acute distress Head: Atraumatic, normal size  Eyes: PEERLA, EOMI  Neck: Supple, no JVD Endocrine: No thryomegaly Cardiac: Normal S1, S2; RRR; no murmurs, rubs, or gallops Lungs: Clear to auscultation bilaterally, no wheezing, rhonchi or rales  Abd: Soft, nontender, no hepatomegaly  Ext: No edema, pulses 2+ Musculoskeletal: No deformities, BUE and BLE strength normal and equal Skin: Warm and dry, no rashes   Neuro: Alert and oriented to person, place, time, and situation, CNII-XII grossly intact, no focal deficits  Psych: Normal mood and affect   ASSESSMENT:   [name] is a 32 y.o. female who presents for the following: 1. Precordial pain   2. Tobacco abuse   3. Chest pain of uncertain  etiology     PLAN:   1. Precordial pain -Chest discomfort for years.  Suspect noncardiac.  Reports this pressure that can last several hours.  Symptoms are ongoing.  Not positional.  Not associated with food or stress.  EKG is normal.  CV exam is normal.  Symptoms could be musculoskeletal.  It is improved with ibuprofen.  I would like for her to get an echo and exercise treadmill  stress test.  We will also set her up for a chest x-ray.  We did discuss that symptoms could be smoking-related.  I recommend she stop smoking.  She is working on this.  She can continue with ibuprofen.  She will follow-up with [place] as needed.  2. Tobacco abuse -3 minutes of smoking cessation counseling was provided in office today.  Disposition: Return if symptoms worsen or fail to improve.  Medication Adjustments/Labs and Tests Ordered: Current medicines are reviewed at length with the patient today.  Concerns regarding medicines are outlined above.  Orders Placed This Encounter  Procedures   DG Chest 2 View   Cardiac Stress Test: Informed Consent Details: Physician/Practitioner Attestation; Transcribe to consent form and obtain patient signature   EXERCISE TOLERANCE TEST (ETT)   EKG 12-Lead   ECHOCARDIOGRAM COMPLETE   No orders of the defined types were placed in this encounter.  Patient Instructions  Medication Instructions:  No changes *If you need a refill on your cardiac medications before your next appointment, please call your pharmacy*  Testing/Procedures: Your physician has requested that you have an echocardiogram. Echocardiography is a painless test that uses sound waves to create images of your heart. It provides your doctor with information about the size and shape of your heart and how well your heart's chambers and valves are working. This procedure takes approximately one hour. There are no restrictions for this procedure. Please do NOT wear cologne, perfume, aftershave, or lotions (deodorant  is allowed). Please arrive 15 minutes prior to your appointment time.                         Patient Instructions for Stress Test  Medication instructions:  May take all medications   Do not eat, drink or use tobacco products four hours prior to the test.  Water is ok.  3.  Dress prepared to exercise in a comfortable, two piece clothing outfit and walking shoes.  4.  Bring any current prescription medications with you the day of the test.  5.  Notify the office 24 hours in advance if you cannot keep this appointment.  6.  If you have any questions, please call [phone].   chest X-ray Ordered today- You can walk into [facility] to have this done   Follow-Up: At [facility], you and your health needs are our priority.  As part of our continuing mission to provide you with exceptional heart care, we have created designated Provider Care Teams.  These Care Teams include your primary Cardiologist (physician) and Advanced Practice Providers (APPs -  Physician Assistants and Nurse Practitioners) who all work together to provide you with the care you need, when you need it.  We recommend signing up for the patient portal called "MyChart".  Sign up information is provided on this After Visit Summary.  MyChart is used to connect with patients for Virtual Visits (Telemedicine).  Patients are able to view lab/test results, encounter notes, upcoming appointments, etc.  Non-urgent messages can be sent to your provider as well.   To learn more about what you can do with MyChart, go to [email or web address].    Your next appointment:    Follow up as needed  Provider:   Dr Flora Lipps    Signed, Lenna Gilford. Flora Lipps, MD, [facility]  [facility]  [address] [place], [place] [address] [phone]  10/21/2022 9:25 AM

## 2022-10-21 ENCOUNTER — Ambulatory Visit: Payer: Medicaid Other | Attending: Cardiovascular Disease | Admitting: Cardiovascular Disease

## 2022-10-21 ENCOUNTER — Encounter: Payer: Self-pay | Admitting: Cardiovascular Disease

## 2022-10-21 VITALS — BP 110/70 | HR 78 | Ht 68.0 in | Wt 204.2 lb

## 2022-10-21 DIAGNOSIS — R079 Chest pain, unspecified: Secondary | ICD-10-CM | POA: Diagnosis not present

## 2022-10-21 DIAGNOSIS — R072 Precordial pain: Secondary | ICD-10-CM | POA: Diagnosis not present

## 2022-10-21 DIAGNOSIS — F17210 Nicotine dependence, cigarettes, uncomplicated: Secondary | ICD-10-CM | POA: Diagnosis not present

## 2022-10-21 DIAGNOSIS — Z720 Tobacco use: Secondary | ICD-10-CM

## 2022-10-21 NOTE — Patient Instructions (Signed)
Medication Instructions:  No changes *If you need a refill on your cardiac medications before your next appointment, please call your pharmacy*  Testing/Procedures: Your physician has requested that you have an echocardiogram. Echocardiography is a painless test that uses sound waves to create images of your heart. It provides your doctor with information about the size and shape of your heart and how well your heart's chambers and valves are working. This procedure takes approximately one hour. There are no restrictions for this procedure. Please do NOT wear cologne, perfume, aftershave, or lotions (deodorant is allowed). Please arrive 15 minutes prior to your appointment time.                         Patient Instructions for Stress Test  Medication instructions:  May take all medications   Do not eat, drink or use tobacco products four hours prior to the test.  Water is ok.  3.  Dress prepared to exercise in a comfortable, two piece clothing outfit and walking shoes.  4.  Bring any current prescription medications with you the day of the test.  5.  Notify the office 24 hours in advance if you cannot keep this appointment.  6.  If you have any questions, please call [phone].   chest X-ray Ordered today- You can walk into [facility] to have this done   Follow-Up: At [facility], you and your health needs are our priority.  As part of our continuing mission to provide you with exceptional heart care, we have created designated Provider Care Teams.  These Care Teams include your primary Cardiologist (physician) and Advanced Practice Providers (APPs -  Physician Assistants and Nurse Practitioners) who all work together to provide you with the care you need, when you need it.  We recommend signing up for the patient portal called "MyChart".  Sign up information is provided on this After Visit Summary.  MyChart is used to connect with patients for Virtual Visits  (Telemedicine).  Patients are able to view lab/test results, encounter notes, upcoming appointments, etc.  Non-urgent messages can be sent to your provider as well.   To learn more about what you can do with MyChart, go to [email or web address].    Your next appointment:    Follow up as needed  Provider:   Dr Flora Lipps

## 2022-11-04 ENCOUNTER — Ambulatory Visit: Payer: Medicaid Other | Attending: Cardiovascular Disease

## 2022-11-04 DIAGNOSIS — R079 Chest pain, unspecified: Secondary | ICD-10-CM | POA: Diagnosis not present

## 2022-11-04 DIAGNOSIS — R072 Precordial pain: Secondary | ICD-10-CM | POA: Diagnosis not present

## 2022-11-04 LAB — EXERCISE TOLERANCE TEST
Angina Index: 0
Duke Treadmill Score: 7
Estimated workload: 7.8
Exercise duration (min): 6 min
Exercise duration (sec): 33 s
MPHR: 188 {beats}/min
Peak HR: 162 {beats}/min
Percent HR: 86 %
RPE: 17
Rest HR: 84 {beats}/min
ST Depression (mm): 0 mm

## 2022-11-05 ENCOUNTER — Ambulatory Visit (HOSPITAL_COMMUNITY): Payer: Medicaid Other | Attending: Cardiovascular Disease

## 2022-11-05 DIAGNOSIS — R072 Precordial pain: Secondary | ICD-10-CM | POA: Diagnosis present

## 2022-11-05 DIAGNOSIS — R079 Chest pain, unspecified: Secondary | ICD-10-CM | POA: Diagnosis not present

## 2022-11-05 LAB — ECHOCARDIOGRAM COMPLETE
Area-P 1/2: 3.21 cm2
S' Lateral: 3.1 cm

## 2022-12-08 ENCOUNTER — Other Ambulatory Visit (HOSPITAL_COMMUNITY)
Admission: RE | Admit: 2022-12-08 | Discharge: 2022-12-08 | Disposition: A | Discharge status: Home / Self Care | Payer: Medicaid Other | Source: Ambulatory Visit | Attending: Obstetrics and Gynecology | Admitting: Obstetrics and Gynecology

## 2022-12-08 ENCOUNTER — Ambulatory Visit: Payer: Medicaid Other | Admitting: Emergency Medicine

## 2022-12-08 VITALS — BP 116/77 | HR 84 | Ht 67.0 in | Wt 202.5 lb

## 2022-12-08 DIAGNOSIS — Z8742 Personal history of other diseases of the female genital tract: Secondary | ICD-10-CM | POA: Insufficient documentation

## 2022-12-08 NOTE — Progress Notes (Signed)
SUBJECTIVE:  32 y.o. female complains of yellow vaginal discharge itching with odor for 6 month(s). Denies abnormal vaginal bleeding or significant pelvic pain or fever. No UTI symptoms. Denies history of known exposure to STD.  No LMP recorded (lmp unknown).  OBJECTIVE:  She appears well, afebrile. Urine dipstick: not done.  ASSESSMENT:  Vaginal Discharge  Vaginal Odor   PLAN:  GC, chlamydia, trichomonas, BVAG, CVAG probe sent to lab. Treatment: To be determined once lab results are received ROV prn if symptoms persist or worsen.

## 2022-12-10 ENCOUNTER — Other Ambulatory Visit: Payer: Self-pay | Admitting: Obstetrics and Gynecology

## 2022-12-10 DIAGNOSIS — B3731 Acute candidiasis of vulva and vagina: Secondary | ICD-10-CM

## 2022-12-10 LAB — CERVICOVAGINAL ANCILLARY ONLY
Bacterial Vaginitis (gardnerella): NEGATIVE
Candida Glabrata: POSITIVE — AB
Candida Vaginitis: POSITIVE — AB
Chlamydia: NEGATIVE
Comment: NEGATIVE
Comment: NEGATIVE
Comment: NEGATIVE
Comment: NEGATIVE
Comment: NEGATIVE
Comment: NORMAL
Neisseria Gonorrhea: NEGATIVE
Trichomonas: NEGATIVE

## 2022-12-10 MED ORDER — FLUCONAZOLE 150 MG PO TABS
ORAL_TABLET | ORAL | 0 refills | Status: AC
Start: 2022-12-10 — End: ?

## 2023-01-11 ENCOUNTER — Ambulatory Visit: Payer: Medicaid Other | Admitting: Dermatology

## 2023-02-28 ENCOUNTER — Ambulatory Visit (INDEPENDENT_AMBULATORY_CARE_PROVIDER_SITE_OTHER): Payer: Medicaid Other | Admitting: Obstetrics

## 2023-02-28 ENCOUNTER — Encounter: Payer: Self-pay | Admitting: Obstetrics

## 2023-02-28 VITALS — BP 125/76 | HR 89 | Wt 212.4 lb

## 2023-02-28 DIAGNOSIS — E569 Vitamin deficiency, unspecified: Secondary | ICD-10-CM

## 2023-02-28 DIAGNOSIS — N946 Dysmenorrhea, unspecified: Secondary | ICD-10-CM | POA: Diagnosis not present

## 2023-02-28 DIAGNOSIS — E66811 Obesity, class 1: Secondary | ICD-10-CM

## 2023-02-28 DIAGNOSIS — B9689 Other specified bacterial agents as the cause of diseases classified elsewhere: Secondary | ICD-10-CM

## 2023-02-28 DIAGNOSIS — N760 Acute vaginitis: Secondary | ICD-10-CM

## 2023-02-28 DIAGNOSIS — N898 Other specified noninflammatory disorders of vagina: Secondary | ICD-10-CM | POA: Diagnosis not present

## 2023-02-28 MED ORDER — TERCONAZOLE 0.8 % VA CREA
1.0000 | TOPICAL_CREAM | Freq: Every day | VAGINAL | 0 refills | Status: AC
Start: 2023-02-28 — End: ?

## 2023-02-28 MED ORDER — IBUPROFEN 800 MG PO TABS: 800.0000 mg | ORAL_TABLET | Freq: Three times a day (TID) | ORAL | 5 refills | Status: AC | PRN

## 2023-02-28 MED ORDER — VITAFOL ULTRA 29-0.6-0.4-200 MG PO CAPS: 1.0000 | ORAL_CAPSULE | Freq: Every day | ORAL | 4 refills | Status: DC

## 2023-02-28 NOTE — Progress Notes (Signed)
Patient ID: Karen Mcmahon, female   DOB: 04/09/1990, 32 y.o.   MRN: [id number]  Chief Complaint  Patient presents with   GYN    HPI Karen Mcmahon is a 32 y.o. female.  Complains of recurrent BV. HPI  Past Medical History:  Diagnosis Date   Hx of gonorrhea    Keloid    Left Upper Lateral Earlobe   Malignant hyperthermia    Pregnant    hx   Trichomonas contact, treated     Past Surgical History:  Procedure Laterality Date   EAR CYST EXCISION Left 04/05/2014   Procedure: EXCISION EAR CYST;  Surgeon: Melvenia Beam, MD;  Location: [facility] OR;  Service: ENT;  Laterality: Left;  Excision of keloid   EXTERNAL EAR SURGERY Left 2012    Family History  Problem Relation Age of Onset   Kidney disease Mother        was on dialysis, rec'd a new kidney   Diabetes Mother    Hypertension Mother    Cancer Father        pancreatic   Pancreatic cancer Father    Diabetes Maternal Grandmother    Hypertension Maternal Grandmother    Other Neg Hx     Social History Social History   Tobacco Use   Smoking status: Former    Current packs/day: 0.00    Average packs/day: 0.5 packs/day for 2.0 years (1.0 ttl pk-yrs)    Types: Cigarettes    Start date: 03/18/2012    Quit date: 03/18/2014    Years since quitting: 8.9   Smokeless tobacco: Never  Vaping Use   Vaping status: Former  Substance Use Topics   Alcohol use: Not Currently    Comment: 1 mixed drink daily. Not since confirmed pregnancy   Drug use: Not Currently    Types: Marijuana    Comment: not since + preg    Allergies  Allergen Reactions   Hydrocodone Itching    Current Outpatient Medications  Medication Sig Dispense Refill   metroNIDAZOLE (FLAGYL) 500 MG tablet Take 500 mg by mouth 3 (three) times daily.     Prenat-Fe Poly-Methfol-FA-DHA (VITAFOL ULTRA) 29-0.6-0.4-200 MG CAPS Take 1 capsule by mouth daily before breakfast. 90 capsule 4   terconazole (TERAZOL 3) 0.8 % vaginal cream Place 1 applicator vaginally at bedtime. 20  g 0   fluconazole (DIFLUCAN) 150 MG tablet Take 1 tablet week x 4 weeks (Patient not taking: Reported on 02/28/2023) 4 tablet 0   ibuprofen (ADVIL) 800 MG tablet Take 1 tablet (800 mg total) by mouth every 8 (eight) hours as needed. 30 tablet 5   No current facility-administered medications for this visit.    Review of Systems Review of Systems Constitutional: negative for fatigue and weight loss Respiratory: negative for cough and wheezing Cardiovascular: negative for chest pain, fatigue and palpitations Gastrointestinal: negative for abdominal pain and change in bowel habits Genitourinary: positive for vaginal discharge Integument/breast: negative for nipple discharge Musculoskeletal:negative for myalgias Neurological: negative for gait problems and tremors Behavioral/Psych: negative for abusive relationship, depression Endocrine: negative for temperature intolerance      Blood pressure 125/76, pulse 89, weight 212 lb 6.4 oz (96.3 kg), last menstrual period 01/31/2023.  Physical Exam Physical Exam General:   Alert and no distress  Skin:   no rash or abnormalities  Lungs:   clear to auscultation bilaterally  Heart:   regular rate and rhythm, S1, S2 normal, no murmur, click, rub or gallop  Breasts:   normal  without suspicious masses, skin or nipple changes or axillary nodes  Abdomen:  normal findings: no organomegaly, soft, non-tender and no hernia  Pelvis:  External genitalia: normal general appearance Urinary system: urethral meatus normal and bladder without fullness, nontender Vaginal: normal without tenderness, induration or masses Cervix: normal appearance Adnexa: normal bimanual exam Uterus: anteverted and non-tender, normal size    I have spent a total of 15 minutes of face-to-face  time, excluding clinical staff time, reviewing notes and preparing to see patient, ordering tests and/or medications, and counseling the patient.   Data Reviewed Wet  Prep Cultures  Assessment     1. Vaginal discharge (Primary) Rx: - metroNIDAZOLE (FLAGYL) 500 MG tablet; Take 500 mg by mouth 3 (three) times daily. - terconazole (TERAZOL 3) 0.8 % vaginal cream; Place 1 applicator vaginally at bedtime.  Dispense: 20 g; Refill: 0  2. BV (bacterial vaginosis), recurrent - taking Metronidazole - recommended change in hygienic practice of avoiding getting soap in vagina, and avoiding perfumed douches  3. Dysmenorrhea Rx: - ibuprofen (ADVIL) 800 MG tablet; Take 1 tablet (800 mg total) by mouth every 8 (eight) hours as needed.  Dispense: 30 tablet; Refill: 5  4. Obesity (BMI 30.0-34.9) - weight reduction with the aid of dietary changes, exercise and behavioral modification recommended  5. Vitamin deficiency Rx: - Prenat-Fe Poly-Methfol-FA-DHA (VITAFOL ULTRA) 29-0.6-0.4-200 MG CAPS; Take 1 capsule by mouth daily before breakfast.  Dispense: 90 capsule; Refill: 4    Plan   Follow up in 3 months  Meds ordered this encounter  Medications   terconazole (TERAZOL 3) 0.8 % vaginal cream    Sig: Place 1 applicator vaginally at bedtime.    Dispense:  20 g    Refill:  0   ibuprofen (ADVIL) 800 MG tablet    Sig: Take 1 tablet (800 mg total) by mouth every 8 (eight) hours as needed.    Dispense:  30 tablet    Refill:  5   Prenat-Fe Poly-Methfol-FA-DHA (VITAFOL ULTRA) 29-0.6-0.4-200 MG CAPS    Sig: Take 1 capsule by mouth daily before breakfast.    Dispense:  90 capsule    Refill:  4     Brock Bad, MD, FACOG Attending Obstetrician & Gynecologist, [facility] for [employer], [facility],[place] 12,30/2024

## 2023-02-28 NOTE — Progress Notes (Signed)
Pt reports vaginal discharge with recurrent BV Pt states that she started Metronidazole on Friday and it has not been working, but pt states that she has only been taking it once a day instead of BID.  Advised pt that not taking meds as prescribed could be the cause of her symptoms not improving.

## 2023-04-20 IMAGING — US US MFM OB FOLLOW-UP
1 series · 13 of 28 positions shown · non-contrast
Comparison: none

[Series 1: us mfm ob follow-up · 13 of 60 slices shown]
[im 3/60]
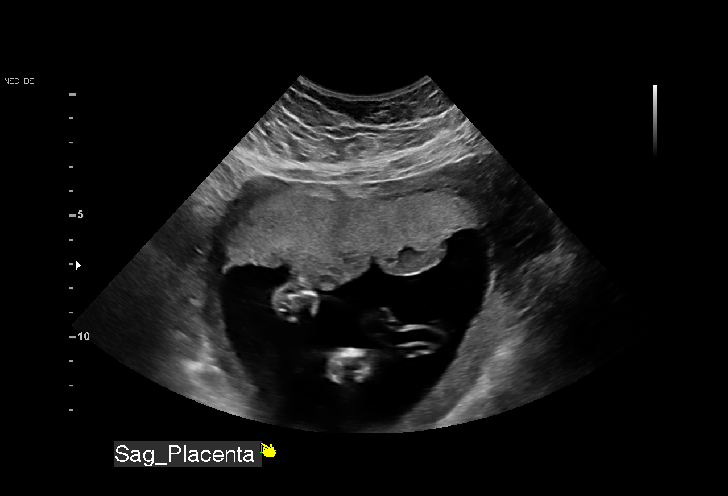
[im 7/60]
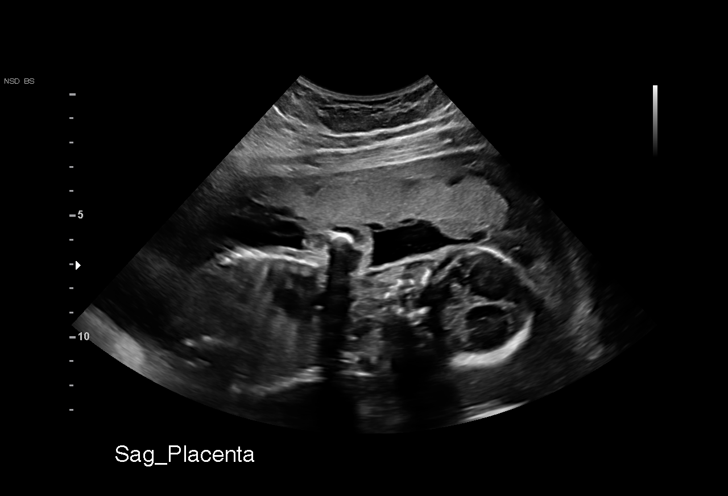
[im 11/60]
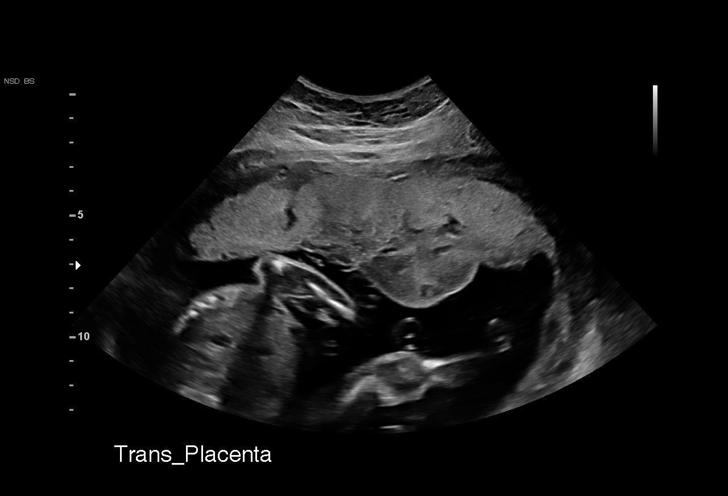
[im 16/60]
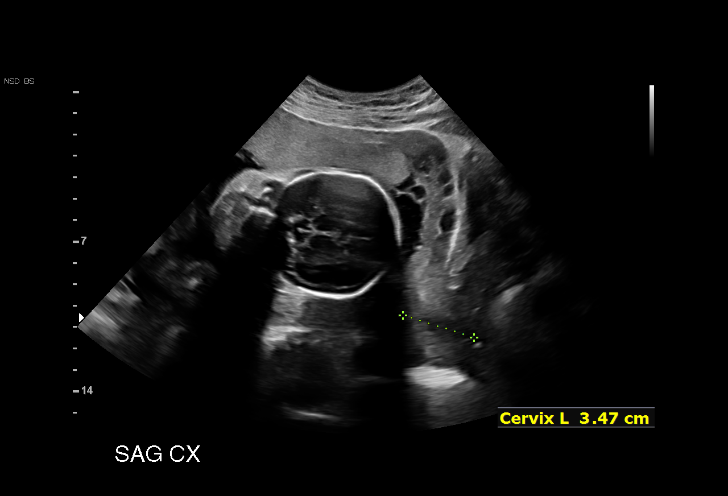
[im 20/60]
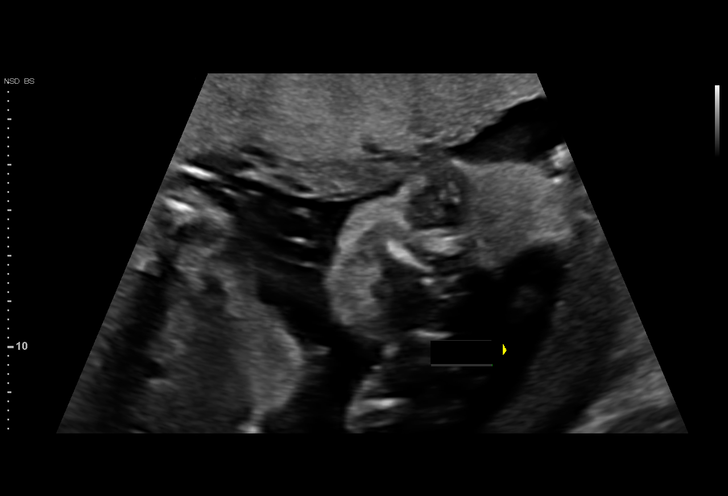
[im 25/60]
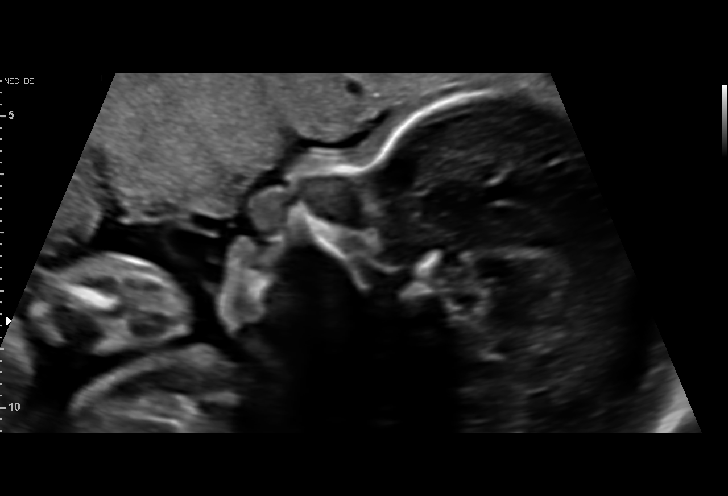
[im 31/60]
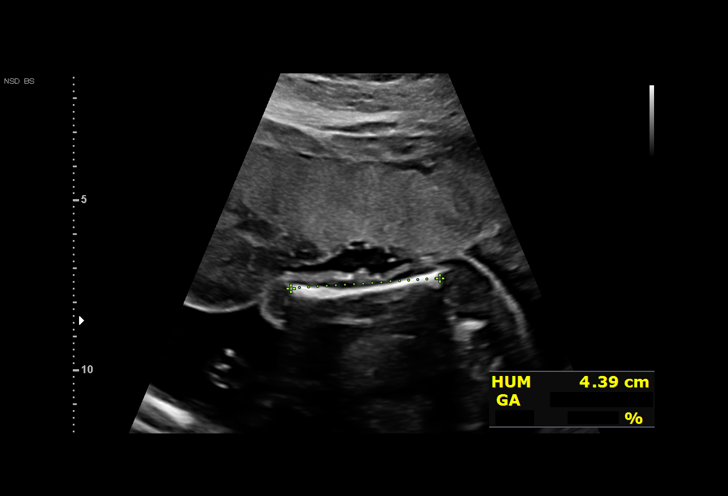
[im 35/60]
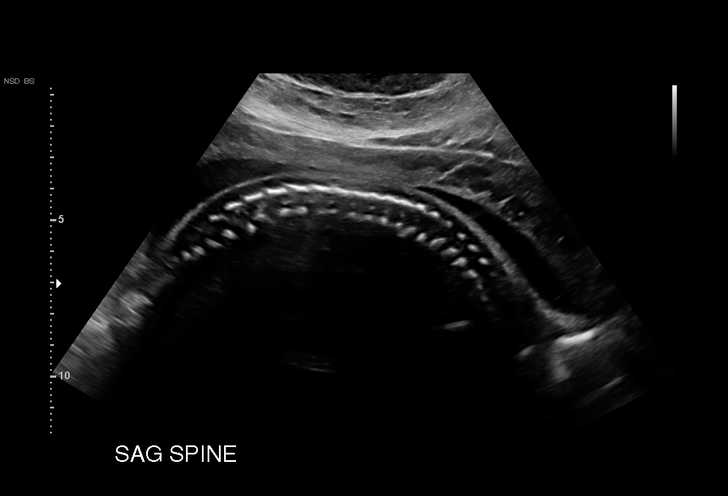
[im 40/60]
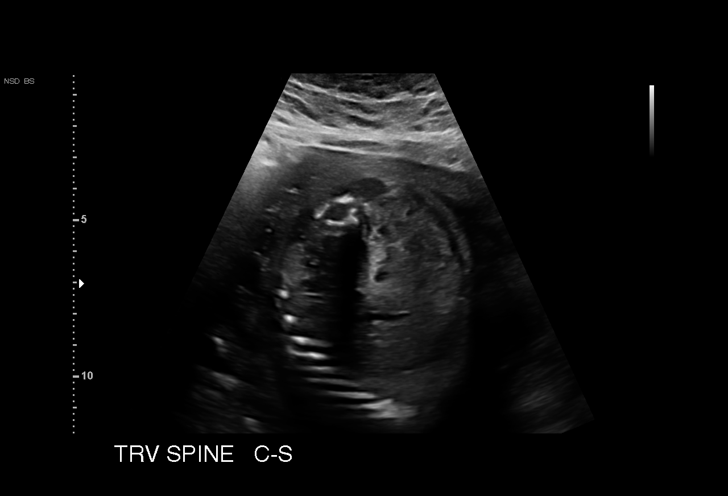
[im 44/60]
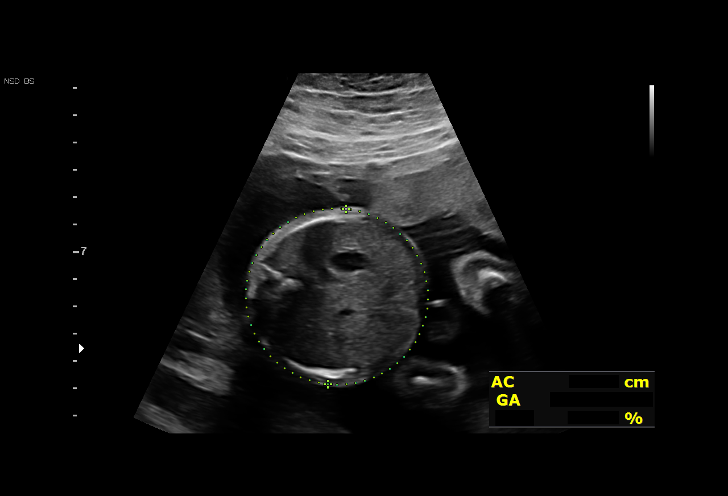
[im 49/60]
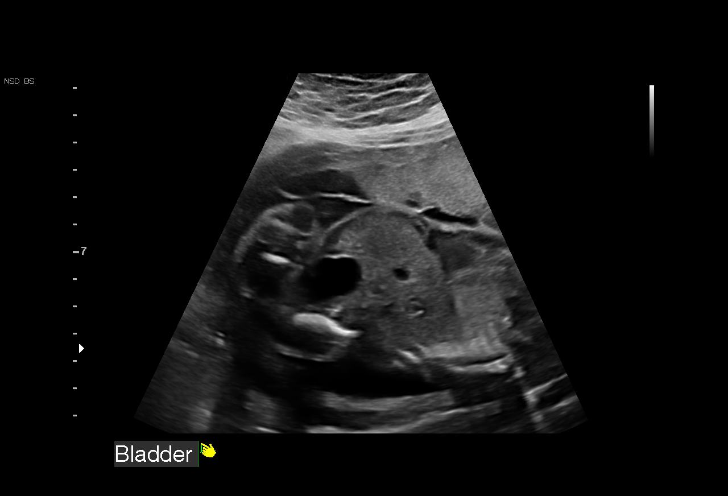
[im 53/60]
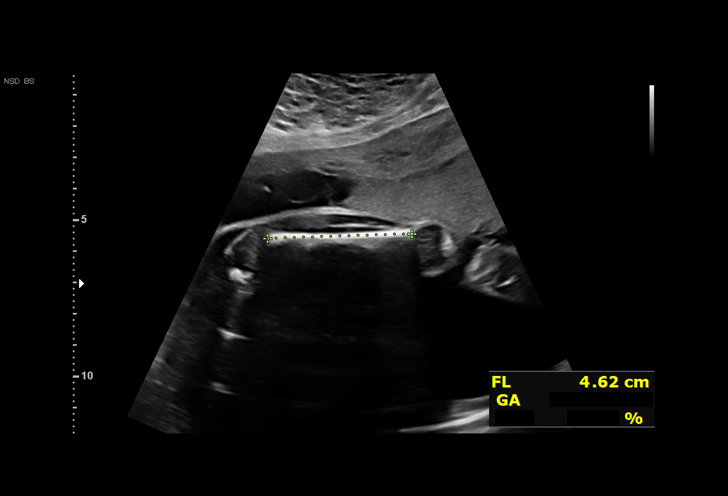
[im 57/60]
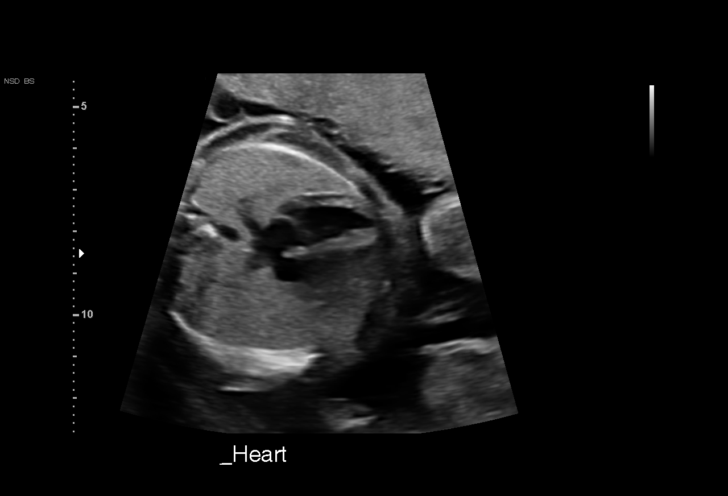

[13 of 28 positions shown; findings below may reference images not displayed]

Indications

 Encounter for antenatal screening for
 malformations
 23 weeks gestation of pregnancy
 Obesity complicating pregnancy, second
 trimester (BMI 31)
 Genetic carrier ([place])
 LR NIPS
Fetal Evaluation

 Num Of Fetuses:         1
 Fetal Heart Rate(bpm):  151
 Cardiac Activity:       Observed
 Presentation:           Cephalic
 Placenta:               Anterior
 P. Cord Insertion:      Previously seen as normal

 Amniotic Fluid
 AFI FV:      Within normal limits

                             Largest Pocket(cm)

Biometry

 BPD:      57.3  mm     G. Age:  23w 4d         32  %    CI:        71.66   %    70 - 86
                                                         FL/HC:      21.4   %    18.7 -
 HC:      215.5  mm     G. Age:  23w 4d         23  %    HC/AC:      1.05        1.05 -
 AC:       206   mm     G. Age:  25w 1d         81  %    FL/BPD:     80.5   %    71 - 87
 FL:       46.1  mm     G. Age:  25w 2d         81  %    FL/AC:      22.4   %    20 - 24
 HUM:      42.7  mm     G. Age:  25w 4d         84  %

 LV:        5.5  mm

 Est. FW:     756  gm    1 lb 11 oz      89  %
OB History

 Blood Type:   B+
 Gravidity:    4         Term:   1        Prem:   0        SAB:   1
 TOP:          1       Ectopic:  0        Living: 1
Gestational Age

 LMP:           23w 4d        Date:  03/29/20                 EDD:   01/03/21
 U/S Today:     24w 3d                                        EDD:   12/28/20
 Best:          23w 6d     Det. By:  U/S C R L  (05/28/20)    EDD:   01/01/21
Anatomy

 Cranium:               Appears normal         LVOT:                   Previously seen
 Cavum:                 Previously seen        Aortic Arch:            Previously seen
 Ventricles:            Appears normal         Ductal Arch:            Previously seen
 Choroid Plexus:        Previously seen        Diaphragm:              Appears normal
 Cerebellum:            Previously seen        Stomach:                Appears normal, left
                                                                       sided
 Posterior Fossa:       Previously seen        Abdomen:                Appears normal
 Nuchal Fold:           Previously seen        Abdominal Wall:         Previously seen
 Face:                  Orbits and profile     Cord Vessels:           Previously seen
                        previously seen
 Lips:                  Previously seen        Kidneys:                Appear normal
 Palate:                Previously seen        Bladder:                Appears normal
 Thoracic:              Appears normal         Spine:                  Appears normal
 Heart:                 Appears normal         Upper Extremities:      Appears normal
                        (4CH, axis, and
                        situs)
 RVOT:                  Previously seen        Lower Extremities:      Previously seen

 Other:  Fetus appears to be female. Nasal Bone, Lenses, 3VV and 3VTV
         visualized. Heels/feet and Left open hand/5th digit visualized.
         Technically difficult due to fetal position.
Cervix Uterus Adnexa

 Cervix
 Length:           3.47  cm.
 Normal appearance by transabdominal scan.

 Right Ovary
 Previously seen

 Left Ovary
 Previously seen.
Impression

 Follow up growth due to complete the fetal anatomy
 Normal interval growth with measurements consistent with
 dates
 Good fetal movement and amniotic fluid volume

 Today we observed a small placental resolving subamniotic
 hemorrhage vs placental lake intervillous thrombus. There is
 no blood flow on color doppler.

 I discussed today's findings with Ms. Elayne, we did not
 schedule a follow up examination, however, if desired a third
 trimester ultrasound may be reasonable.
Recommendations

 Follow up growth as clinically indicated.

## 2023-07-15 ENCOUNTER — Encounter: Payer: Self-pay | Admitting: Emergency Medicine

## 2023-07-15 ENCOUNTER — Ambulatory Visit: Admission: EM | Admit: 2023-07-15 | Discharge: 2023-07-15 | Discharge status: Against Medical Advice

## 2023-07-15 DIAGNOSIS — Z5329 Procedure and treatment not carried out because of patient's decision for other reasons: Secondary | ICD-10-CM

## 2023-07-15 NOTE — ED Notes (Signed)
 Pt reports dizziness and lightheadedness upon waking today around 1pm (works night shift). Pt states that dizziness subsided after being up and awake for 1hr. She is concerned that it could be associated with abnormal  vaginal bleeding she has had for 7 days. Reports the bleeding has been light, but it is not her period. Reports it feels very different. LMP started on 06/19/2023.

## 2023-07-15 NOTE — ED Notes (Addendum)
 16 - Room ready for pt. Not present in lobby. 1 call went to generic VM.   1912 - 2nd call to VM. Pt still not in lobby  1917 - 3rd call. Pt answered and has left parking lot due to wait time. Advised that if her condition changed in severity to head to ED or she is welcome to head back to urgent care or make an appointment in my chart for following day. Pt voiced understanding.

## 2023-07-15 NOTE — ED Triage Notes (Signed)
 Pt c/o dizziness today only. Pt works 3rd shift and says no dizziness at work last night. She states when she woke up after sleeping is when she experienced the dizziness. Pt denies SOB and chest pain. No other sxs to report.

## 2023-07-16 ENCOUNTER — Ambulatory Visit (HOSPITAL_COMMUNITY): Payer: Self-pay

## 2023-07-16 NOTE — ED Provider Notes (Signed)
 Patient left after being triaged   Buena Carmine, NP 07/16/23 [phone]

## 2023-07-17 ENCOUNTER — Ambulatory Visit: Payer: Self-pay

## 2023-09-22 ENCOUNTER — Ambulatory Visit: Admitting: Obstetrics

## 2023-10-06 ENCOUNTER — Ambulatory Visit: Admitting: Obstetrics

## 2023-10-18 ENCOUNTER — Ambulatory Visit: Admitting: Obstetrics and Gynecology

## 2023-11-21 ENCOUNTER — Other Ambulatory Visit (HOSPITAL_COMMUNITY)
Admission: RE | Admit: 2023-11-21 | Discharge: 2023-11-21 | Disposition: A | Source: Ambulatory Visit | Attending: Obstetrics | Admitting: Obstetrics

## 2023-11-21 ENCOUNTER — Encounter: Payer: Self-pay | Admitting: Obstetrics

## 2023-11-21 ENCOUNTER — Ambulatory Visit: Admitting: Obstetrics

## 2023-11-21 VITALS — BP 99/67 | HR 80 | Ht 67.0 in | Wt 222.0 lb

## 2023-11-21 DIAGNOSIS — N898 Other specified noninflammatory disorders of vagina: Secondary | ICD-10-CM | POA: Insufficient documentation

## 2023-11-21 DIAGNOSIS — Z3202 Encounter for pregnancy test, result negative: Secondary | ICD-10-CM

## 2023-11-21 DIAGNOSIS — Z3009 Encounter for other general counseling and advice on contraception: Secondary | ICD-10-CM

## 2023-11-21 DIAGNOSIS — N946 Dysmenorrhea, unspecified: Secondary | ICD-10-CM | POA: Diagnosis not present

## 2023-11-21 DIAGNOSIS — E66811 Obesity, class 1: Secondary | ICD-10-CM

## 2023-11-21 DIAGNOSIS — Z01419 Encounter for gynecological examination (general) (routine) without abnormal findings: Secondary | ICD-10-CM | POA: Diagnosis present

## 2023-11-21 DIAGNOSIS — E569 Vitamin deficiency, unspecified: Secondary | ICD-10-CM

## 2023-11-21 LAB — POCT URINE PREGNANCY: Preg Test, Ur: NEGATIVE

## 2023-11-21 MED ORDER — FLUCONAZOLE 200 MG PO TABS: 200.0000 mg | ORAL_TABLET | ORAL | 2 refills | Status: AC

## 2023-11-21 MED ORDER — METRONIDAZOLE 500 MG PO TABS: 500.0000 mg | ORAL_TABLET | Freq: Two times a day (BID) | ORAL | 2 refills | Status: AC

## 2023-11-21 MED ORDER — IBUPROFEN 800 MG PO TABS: 800.0000 mg | ORAL_TABLET | Freq: Three times a day (TID) | ORAL | 5 refills | Status: AC | PRN

## 2023-11-21 MED ORDER — VITAFOL ULTRA 29-0.6-0.4-200 MG PO CAPS: 1.0000 | ORAL_CAPSULE | Freq: Every day | ORAL | 4 refills | Status: AC

## 2023-11-21 NOTE — Progress Notes (Signed)
 Discharge few weeks. Used boric acid. White with odor. Had 2 menses last month, never had this prior. Seem to be irregular flow past 2 months. Declines BCM at this time. PHQ 5  GAD 4

## 2023-11-21 NOTE — Progress Notes (Signed)
 Subjective:        Karen Mcmahon is a 33 y.o. female here for a routine exam.  Current complaints: Vaginal discharge with irritation.    Personal health questionnaire:  Is patient Ashkenazi Jewish, have a family history of breast and/or ovarian cancer: no Is there a family history of uterine cancer diagnosed at age < 43, gastrointestinal cancer, urinary tract cancer, family member who is a Personnel officer syndrome-associated carrier: no Is the patient overweight and hypertensive, family history of diabetes, personal history of gestational diabetes, preeclampsia or PCOS: no Is patient over 58, have PCOS,  family history of premature CHD under age 83, diabetes, smoke, have hypertension or peripheral artery disease:  no At any time, has a partner hit, kicked or otherwise hurt or frightened you?: no Over the past 2 weeks, have you felt down, depressed or hopeless?: no Over the past 2 weeks, have you felt little interest or pleasure in doing things?:no   Gynecologic History Patient's last menstrual period was 10/24/2023 (exact date). Contraception: none Last Pap: 2023. Results were: normal Last mammogram: n/a. Results were: n/a  Obstetric History OB History  Gravida Para Term Preterm AB Living  4 2 2  2 2   SAB IAB Ectopic Multiple Live Births  1 1  0 2    # Outcome Date GA Lbr Len/2nd Weight Sex Type Anes PTL Lv  4 Term 12/25/20 [redacted]w[redacted]d 03:09 / 04:04 9 lb 6 oz (4.252 kg) F Vag-Spont EPI  LIV     Birth Comments: WDL  3 Term 03/02/12 [redacted]w[redacted]d 32:38 / 00:33 8 lb 13.6 oz (4.015 kg) M Vag-Spont EPI  LIV     Birth Comments: caput  2 SAB 2011          1 IAB 2010 [redacted]w[redacted]d           Past Medical History:  Diagnosis Date   Hx of gonorrhea    Keloid    Left Upper Lateral Earlobe   Malignant hyperthermia    Pregnant    hx   Trichomonas contact, treated     Past Surgical History:  Procedure Laterality Date   EAR CYST EXCISION Left 04/05/2014   Procedure: EXCISION EAR CYST;  Surgeon: Merilee Kraft,  MD;  Location: [facility] OR;  Service: ENT;  Laterality: Left;  Excision of keloid   EXTERNAL EAR SURGERY Left 2012     Current Outpatient Medications:    fluconazole  (DIFLUCAN ) 200 MG tablet, Take 1 tablet (200 mg total) by mouth every 3 (three) days., Disp: 3 tablet, Rfl: 2   ibuprofen  (ADVIL ) 800 MG tablet, Take 1 tablet (800 mg total) by mouth every 8 (eight) hours as needed., Disp: 30 tablet, Rfl: 5   ibuprofen  (ADVIL ) 800 MG tablet, Take 1 tablet (800 mg total) by mouth every 8 (eight) hours as needed., Disp: 30 tablet, Rfl: 5   metroNIDAZOLE  (FLAGYL ) 500 MG tablet, Take 1 tablet (500 mg total) by mouth 2 (two) times daily., Disp: 14 tablet, Rfl: 2   Vitamin D, Ergocalciferol, (DRISDOL) 1.25 MG (50000 UNIT) CAPS capsule, Take 50,000 Units by mouth once a week., Disp: , Rfl:    fluconazole  (DIFLUCAN ) 150 MG tablet, Take 1 tablet week x 4 weeks (Patient not taking: Reported on 11/21/2023), Disp: 4 tablet, Rfl: 0   metroNIDAZOLE  (FLAGYL ) 500 MG tablet, Take 500 mg by mouth 3 (three) times daily. (Patient not taking: Reported on 07/15/2023), Disp: , Rfl:    Prenat-Fe Poly-Methfol-FA-DHA (VITAFOL  ULTRA) 29-0.6-0.4-200 MG CAPS, Take 1  capsule by mouth daily before breakfast., Disp: 90 capsule, Rfl: 4   terconazole  (TERAZOL 3 ) 0.8 % vaginal cream, Place 1 applicator vaginally at bedtime. (Patient not taking: Reported on 11/21/2023), Disp: 20 g, Rfl: 0 Allergies  Allergen Reactions   Hydrocodone  Itching    Social History   Tobacco Use   Smoking status: Former    Current packs/day: 0.00    Average packs/day: 0.5 packs/day for 2.0 years (1.0 ttl pk-yrs)    Types: Cigarettes    Start date: 03/18/2012    Quit date: 03/18/2014    Years since quitting: 9.6   Smokeless tobacco: Never  Substance Use Topics   Alcohol use: Yes    Comment: social    Family History  Problem Relation Age of Onset   Kidney disease Mother        was on dialysis, rec'd a new kidney   Diabetes Mother    Hypertension Mother     Cancer Father        pancreatic   Pancreatic cancer Father    Diabetes Maternal Grandmother    Hypertension Maternal Grandmother    Other Neg Hx       Review of Systems  Constitutional: negative for fatigue and weight loss Respiratory: negative for cough and wheezing Cardiovascular: negative for chest pain, fatigue and palpitations Gastrointestinal: negative for abdominal pain and change in bowel habits Musculoskeletal:negative for myalgias Neurological: negative for gait problems and tremors Behavioral/Psych: negative for abusive relationship, depression Endocrine: negative for temperature intolerance    Genitourinary: positive for vaginal discharge with irritation.  negative for abnormal menstrual periods, genital lesions, hot flashes, sexual problems  Integument/breast: negative for breast lump, breast tenderness, nipple discharge and skin lesion(s)    Objective:       BP 99/67   Pulse 80   Ht 5' 7 (1.702 m)   Wt 222 lb (100.7 kg)   LMP 10/24/2023 (Exact Date)   BMI 34.77 kg/m  General:   Alert and no distress  Skin:   no rash or abnormalities  Lungs:   clear to auscultation bilaterally  Heart:   regular rate and rhythm, S1, S2 normal, no murmur, click, rub or gallop  Breasts:   normal without suspicious masses, skin or nipple changes or axillary nodes  Abdomen:  normal findings: no organomegaly, soft, non-tender and no hernia  Pelvis:  External genitalia: normal general appearance Urinary system: urethral meatus normal and bladder without fullness, nontender Vaginal: normal without tenderness, induration or masses Cervix: normal appearance Adnexa: normal bimanual exam Uterus: anteverted and non-tender, normal size   Lab Review Urine pregnancy test Labs reviewed yes Radiologic studies reviewed no  I have spent a total of 20 minutes of face-to-face time, excluding clinical staff time, reviewing notes and preparing to see patient, ordering tests and/or  medications, and counseling the patient.   Assessment:    1. Encounter for gynecological examination with Papanicolaou smear of cervix (Primary) Rx: - Cytology - PAP( [place])  2. Dysmenorrhea Rx: - ibuprofen  (ADVIL ) 800 MG tablet; Take 1 tablet (800 mg total) by mouth every 8 (eight) hours as needed.  Dispense: 30 tablet; Refill: 5  3. Vaginal discharge Rx: - Cytology - PAP( [place]) - RPR - HIV antibody (with reflex) - Hepatitis C Antibody - Hepatitis B surface antibody,quantitative - Cervicovaginal ancillary only( [place]) - metroNIDAZOLE  (FLAGYL ) 500 MG tablet; Take 1 tablet (500 mg total) by mouth 2 (two) times daily.  Dispense: 14 tablet; Refill: 2 - fluconazole  (  DIFLUCAN ) 200 MG tablet; Take 1 tablet (200 mg total) by mouth every 3 (three) days.  Dispense: 3 tablet; Refill: 2  4. Encounter for other general counseling and advice on contraception Rx: - POCT urine pregnancy  5. Obesity (BMI 30.0-34.9) - weight  reduction with the aid of dietary changes, exercise and behavioral modification recommended  6. Vitamin deficiency Rx: - Prenat-Fe Poly-Methfol-FA-DHA (VITAFOL  ULTRA) 29-0.6-0.4-200 MG CAPS; Take 1 capsule by mouth daily before breakfast.  Dispense: 90 capsule; Refill: 4     Plan:    Education reviewed: calcium supplements, depression evaluation, low fat, low cholesterol diet, safe sex/STD prevention, self breast exams, and weight bearing exercise. Contraception: none. Follow up in: 1 year.    Orders Placed This Encounter  Procedures   RPR   HIV antibody (with reflex)   Hepatitis C Antibody   Hepatitis B surface antibody,quantitative   POCT urine pregnancy     CARLIN RONAL CENTERS, MD, FACOG Attending Obstetrician & Gynecologist, [facility] for [facility], [facility], [place] 11/21/2023

## 2023-11-22 LAB — CERVICOVAGINAL ANCILLARY ONLY
Bacterial Vaginitis (gardnerella): POSITIVE — AB
Candida Glabrata: NEGATIVE
Candida Vaginitis: NEGATIVE
Chlamydia: NEGATIVE
Comment: NEGATIVE
Comment: NEGATIVE
Comment: NEGATIVE
Comment: NEGATIVE
Comment: NEGATIVE
Comment: NORMAL
Neisseria Gonorrhea: NEGATIVE
Trichomonas: NEGATIVE

## 2023-11-22 LAB — HEPATITIS B SURFACE ANTIBODY, QUANTITATIVE: Hepatitis B Surf Ab Quant: 127 m[IU]/mL

## 2023-11-22 LAB — HEPATITIS C ANTIBODY: Hep C Virus Ab: NONREACTIVE

## 2023-11-22 LAB — RPR: RPR Ser Ql: NONREACTIVE

## 2023-11-22 LAB — HIV ANTIBODY (ROUTINE TESTING W REFLEX): HIV Screen 4th Generation wRfx: NONREACTIVE

## 2023-11-23 ENCOUNTER — Ambulatory Visit: Payer: Self-pay | Admitting: Obstetrics

## 2023-11-23 ENCOUNTER — Other Ambulatory Visit: Payer: Self-pay

## 2023-11-23 LAB — CYTOLOGY - PAP
Adequacy: ABSENT
Comment: NEGATIVE
Diagnosis: NEGATIVE
High risk HPV: NEGATIVE

## 2023-11-23 MED ORDER — METRONIDAZOLE 500 MG PO TABS: 500.0000 mg | ORAL_TABLET | Freq: Two times a day (BID) | ORAL | 0 refills | Status: AC

## 2023-11-30 ENCOUNTER — Encounter: Payer: Self-pay | Admitting: Obstetrics and Gynecology

## 2023-11-30 ENCOUNTER — Ambulatory Visit: Admitting: Obstetrics and Gynecology

## 2023-11-30 VITALS — BP 106/72 | HR 90 | Ht 67.0 in | Wt 220.0 lb

## 2023-11-30 DIAGNOSIS — N926 Irregular menstruation, unspecified: Secondary | ICD-10-CM | POA: Diagnosis not present

## 2023-11-30 LAB — POCT URINE PREGNANCY: Preg Test, Ur: NEGATIVE

## 2023-11-30 NOTE — Progress Notes (Signed)
   GYNECOLOGY PROGRESS NOTE  History:  33 y.o. [id number] presents to [facility] for late period. Her LMP was 10/24/23, she has not started her period yet. Reports she had a cycle beginning of August and the end of August. She had negative UPT at home. She was seen 9/22 and dx with BV took flagyl . She is sexually active, using NFP. Does not desires contraception.   The following portions of the patient's history were reviewed and updated as appropriate: allergies, current medications, past family history, past medical history, past social history, past surgical history and problem list. Last pap smear on 11/21/23 was normal, neg HRHPV.  Health Maintenance Due  Topic Date Due   Hepatitis B Vaccines 19-59 Average Risk (1 of 3 - 19+ 3-dose series) Never done   HPV VACCINES (1 - 3-dose SCDM series) Never done   DTaP/Tdap/Td (2 - Td or Tdap) 05/29/2021   Influenza Vaccine  Never done   COVID-19 Vaccine (1 - 2024-25 season) Never done     Review of Systems:  Pertinent items are noted in HPI.   Objective:  Physical Exam Blood pressure 106/72, pulse 90, height 5' 7 (1.702 m), weight 220 lb (99.8 kg), last menstrual period 10/24/2023. VS reviewed, nursing note reviewed,  Constitutional: well developed, well nourished, no distress HEENT: normocephalic Pulm/chest wall: normal effort Neuro: alert and oriented  Skin: warm, dry Psych: affect normal Pelvic exam:deferred  Assessment & Plan:  1. Late period (Primary) UPT negative in clinic, discussed differentials to cause late cycle given neg UPT, encouraged retest in two weeks if no cycle.  This is first occurrence of late cycle, discussed work up if continues to skip periods.  Recently finished flagyl  for BV  - POCT urine pregnancy  Nidia Daring, FNP

## 2023-11-30 NOTE — Progress Notes (Signed)
 Pt c/o no periods in September. LMP 10-24-23 UPT negative at home this morning

## 2024-03-14 ENCOUNTER — Ambulatory Visit: Admitting: Podiatry

## 2024-03-14 DIAGNOSIS — M722 Plantar fascial fibromatosis: Secondary | ICD-10-CM | POA: Diagnosis not present

## 2024-03-14 DIAGNOSIS — M216X2 Other acquired deformities of left foot: Secondary | ICD-10-CM | POA: Diagnosis not present

## 2024-03-14 DIAGNOSIS — M216X1 Other acquired deformities of right foot: Secondary | ICD-10-CM | POA: Diagnosis not present

## 2024-03-14 DIAGNOSIS — S96212A Strain of intrinsic muscle and tendon at ankle and foot level, left foot, initial encounter: Secondary | ICD-10-CM | POA: Diagnosis not present

## 2024-03-14 NOTE — Progress Notes (Signed)
 "  Subjective:  Patient ID: Karen Mcmahon, female    DOB: 07-23-1990,  MRN: [id number]  Chief Complaint  Patient presents with   Foot Pain    Pt stated that she has been having some pain in her heel she stated that this has been going on for about a month     34 y.o. female presents with the above complaint.  Patient presents with left heel pain that has been going on for quite some time is progressing and worse worse with ambulation or shoe pressure she wants to discuss treatment options for it has not seen anyone else prior to seeing me denies any other acute complaints.  She also does not wear any orthotics.   Review of Systems: Negative except as noted in the HPI. Denies N/V/F/Ch.  Past Medical History:  Diagnosis Date   Hx of gonorrhea    Keloid    Left Upper Lateral Earlobe   Malignant hyperthermia    Pregnant    hx   Trichomonas contact, treated    Current Medications[1]  Tobacco Use History[2]  Allergies[3] Objective:  There were no vitals filed for this visit. There is no height or weight on file to calculate BMI. Constitutional Well developed. Well nourished.  Vascular Dorsalis pedis pulses palpable bilaterally. Posterior tibial pulses palpable bilaterally. Capillary refill normal to all digits.  No cyanosis or clubbing noted. Pedal hair growth normal.  Neurologic Normal speech. Oriented to person, place, and time. Epicritic sensation to light touch grossly present bilaterally.  Dermatologic Nails well groomed and normal in appearance. No open wounds. No skin lesions.  Orthopedic: Normal joint ROM without pain or crepitus bilaterally. No visible deformities. Tender to palpation at the calcaneal tuber left. No pain with calcaneal squeeze left. Ankle ROM diminished range of motion left. Silfverskiold Test: positive left.   Radiographs: None  Assessment:   1. Strain of intrinsic muscle of left foot   2. Other acquired deformities of left foot   3. Other  acquired deformities of right foot   4. Plantar fasciitis of left foot    Plan:  Patient was evaluated and treated and all questions answered.  Plantar Fasciitis, left/strain of the plantar fascia - XR reviewed as above.  - Educated on icing and stretching. Instructions given.  - Injection delivered to the plantar fascia as below. - DME: Plantar fascial brace dispensed to support the medial longitudinal arch of the foot and offload pressure from the heel and prevent arch collapse during weightbearing - Pharmacologic management: None  Pes planovalgus/foot deformity -I explained to patient the etiology of pes planovalgus and relationship with heel pain/arch pain and various treatment options were discussed.  Given patient foot structure in the setting of heel pain/arch pain I believe patient will benefit from custom-made orthotics to help control the hindfoot motion support the arch of the foot and take the stress away from arches.  Patient agrees with the plan like to proceed with orthotics -Patient was casted for orthotics   Procedure: Injection Tendon/Ligament Location: Left plantar fascia at the glabrous junction; medial approach. Skin Prep: alcohol Injectate: 0.5 cc 0.5% marcaine  plain, 0.5 cc of 1% Lidocaine , 0.5 cc kenalog  10. Disposition: Patient tolerated procedure well. Injection site dressed with a band-aid.  No follow-ups on file.    [1]  Current Outpatient Medications:    fluconazole  (DIFLUCAN ) 150 MG tablet, Take 1 tablet week x 4 weeks (Patient not taking: Reported on 11/30/2023), Disp: 4 tablet, Rfl: 0   fluconazole  (  DIFLUCAN ) 200 MG tablet, Take 1 tablet (200 mg total) by mouth every 3 (three) days. (Patient not taking: Reported on 11/30/2023), Disp: 3 tablet, Rfl: 2   ibuprofen  (ADVIL ) 800 MG tablet, Take 1 tablet (800 mg total) by mouth every 8 (eight) hours as needed. (Patient not taking: Reported on 11/30/2023), Disp: 30 tablet, Rfl: 5   ibuprofen  (ADVIL ) 800 MG  tablet, Take 1 tablet (800 mg total) by mouth every 8 (eight) hours as needed. (Patient not taking: Reported on 11/30/2023), Disp: 30 tablet, Rfl: 5   metroNIDAZOLE  (FLAGYL ) 500 MG tablet, Take 500 mg by mouth 3 (three) times daily. (Patient not taking: Reported on 11/30/2023), Disp: , Rfl:    metroNIDAZOLE  (FLAGYL ) 500 MG tablet, Take 1 tablet (500 mg total) by mouth 2 (two) times daily., Disp: 14 tablet, Rfl: 2   metroNIDAZOLE  (FLAGYL ) 500 MG tablet, Take 1 tablet (500 mg total) by mouth 2 (two) times daily. (Patient not taking: Reported on 11/30/2023), Disp: 14 tablet, Rfl: 0   Prenat-Fe Poly-Methfol-FA-DHA (VITAFOL  ULTRA) 29-0.6-0.4-200 MG CAPS, Take 1 capsule by mouth daily before breakfast. (Patient not taking: Reported on 11/30/2023), Disp: 90 capsule, Rfl: 4   terconazole  (TERAZOL 3 ) 0.8 % vaginal cream, Place 1 applicator vaginally at bedtime. (Patient not taking: Reported on 11/30/2023), Disp: 20 g, Rfl: 0   Vitamin D, Ergocalciferol, (DRISDOL) 1.25 MG (50000 UNIT) CAPS capsule, Take 50,000 Units by mouth once a week. (Patient not taking: Reported on 11/30/2023), Disp: , Rfl:  [2]  Social History Tobacco Use  Smoking Status Former   Current packs/day: 0.00   Average packs/day: 0.5 packs/day for 2.0 years (1.0 ttl pk-yrs)   Types: Cigarettes   Start date: 03/18/2012   Quit date: 03/18/2014   Years since quitting: 10.0  Smokeless Tobacco Never  [3]  Allergies Allergen Reactions   Hydrocodone  Itching   "

## 2024-04-11 ENCOUNTER — Ambulatory Visit: Admitting: Podiatry
